# Patient Record
Sex: Male | Born: 1950 | Race: White | Hispanic: No | Marital: Married | Smoking: Former smoker
Health system: Southern US, Community
[De-identification: ages and names within clinical notes are randomized; demographics above are authoritative.]

## PROBLEM LIST (undated history)

## (undated) DIAGNOSIS — G9389 Other specified disorders of brain: Secondary | ICD-10-CM

## (undated) DIAGNOSIS — J45909 Unspecified asthma, uncomplicated: Secondary | ICD-10-CM

## (undated) DIAGNOSIS — E538 Deficiency of other specified B group vitamins: Secondary | ICD-10-CM

## (undated) DIAGNOSIS — D649 Anemia, unspecified: Secondary | ICD-10-CM

## (undated) DIAGNOSIS — I10 Essential (primary) hypertension: Secondary | ICD-10-CM

## (undated) DIAGNOSIS — T7840XA Allergy, unspecified, initial encounter: Secondary | ICD-10-CM

## (undated) DIAGNOSIS — R269 Unspecified abnormalities of gait and mobility: Secondary | ICD-10-CM

## (undated) HISTORY — DX: Unspecified asthma, uncomplicated: J45.909

## (undated) HISTORY — DX: Essential (primary) hypertension: I10

## (undated) HISTORY — DX: Unspecified abnormalities of gait and mobility: R26.9

## (undated) HISTORY — DX: Allergy, unspecified, initial encounter: T78.40XA

## (undated) HISTORY — DX: Other specified disorders of brain: G93.89

## (undated) HISTORY — DX: Deficiency of other specified B group vitamins: E53.8

## (undated) HISTORY — DX: Anemia, unspecified: D64.9

## (undated) HISTORY — PX: LUMBAR DRAIN IMPLANTATION: SHX334

---

## 2001-02-24 DIAGNOSIS — I10 Essential (primary) hypertension: Secondary | ICD-10-CM | POA: Insufficient documentation

## 2001-02-24 DIAGNOSIS — J309 Allergic rhinitis, unspecified: Secondary | ICD-10-CM | POA: Insufficient documentation

## 2006-06-24 DIAGNOSIS — I421 Obstructive hypertrophic cardiomyopathy: Secondary | ICD-10-CM | POA: Insufficient documentation

## 2010-12-17 DIAGNOSIS — J4520 Mild intermittent asthma, uncomplicated: Secondary | ICD-10-CM | POA: Insufficient documentation

## 2012-02-12 DIAGNOSIS — G932 Benign intracranial hypertension: Secondary | ICD-10-CM

## 2012-02-12 HISTORY — DX: Benign intracranial hypertension: G93.2

## 2012-03-25 DIAGNOSIS — G9389 Other specified disorders of brain: Secondary | ICD-10-CM | POA: Insufficient documentation

## 2012-03-25 DIAGNOSIS — E538 Deficiency of other specified B group vitamins: Secondary | ICD-10-CM | POA: Insufficient documentation

## 2012-03-25 DIAGNOSIS — R269 Unspecified abnormalities of gait and mobility: Secondary | ICD-10-CM | POA: Insufficient documentation

## 2013-02-27 DIAGNOSIS — M21969 Unspecified acquired deformity of unspecified lower leg: Secondary | ICD-10-CM | POA: Insufficient documentation

## 2014-07-27 ENCOUNTER — Telehealth: Payer: Self-pay | Admitting: Family Medicine

## 2014-07-27 NOTE — Telephone Encounter (Signed)
Pt given new pt appt with Dr. Livia Snellen 09/07/14 at 1:10, pt aware to arrive 15 minutes prior to appt with a copy of insurance card and a list of current medications. Pt was seeing a doctor in [place] but insurance changed so needs new PCP.

## 2014-09-07 ENCOUNTER — Encounter: Payer: Self-pay | Admitting: Family Medicine

## 2014-09-07 ENCOUNTER — Ambulatory Visit (INDEPENDENT_AMBULATORY_CARE_PROVIDER_SITE_OTHER): Payer: Medicare Other | Admitting: Family Medicine

## 2014-09-07 ENCOUNTER — Encounter (INDEPENDENT_AMBULATORY_CARE_PROVIDER_SITE_OTHER): Payer: Self-pay

## 2014-09-07 VITALS — BP 130/79 | HR 69 | Temp 97.7°F | Ht 67.0 in | Wt 166.8 lb

## 2014-09-07 DIAGNOSIS — I10 Essential (primary) hypertension: Secondary | ICD-10-CM | POA: Diagnosis not present

## 2014-09-07 DIAGNOSIS — I421 Obstructive hypertrophic cardiomyopathy: Secondary | ICD-10-CM | POA: Diagnosis not present

## 2014-09-07 DIAGNOSIS — R5383 Other fatigue: Secondary | ICD-10-CM

## 2014-09-07 DIAGNOSIS — R269 Unspecified abnormalities of gait and mobility: Secondary | ICD-10-CM

## 2014-09-07 DIAGNOSIS — E538 Deficiency of other specified B group vitamins: Secondary | ICD-10-CM

## 2014-09-07 DIAGNOSIS — G9389 Other specified disorders of brain: Secondary | ICD-10-CM

## 2014-09-07 MED ORDER — MONTELUKAST SODIUM 10 MG PO TABS: 10.0000 mg | ORAL_TABLET | Freq: Every day | ORAL | Status: DC

## 2014-09-07 MED ORDER — CYANOCOBALAMIN 1000 MCG/ML IJ SOLN: 1000.0000 ug | INTRAMUSCULAR | Status: DC

## 2014-09-07 MED ORDER — LISINOPRIL 20 MG PO TABS: 20.0000 mg | ORAL_TABLET | Freq: Every day | ORAL | Status: DC

## 2014-09-07 MED ORDER — METOPROLOL TARTRATE 25 MG PO TABS: 25.0000 mg | ORAL_TABLET | Freq: Every day | ORAL | Status: DC

## 2014-09-07 NOTE — Progress Notes (Signed)
Subjective:  Patient ID: Jimmy Park, male    DOB: 1950/06/27  Age: 64 y.o. MRN: [id number]  CC: Establish Care   HPI Jimmy Park presents as a new patient to the practice and the area. He has past history of diagnoses noted below including asthma hypertension anemia as well as hypertropic cardiomyopathy. Works with chiropractor weekly to help with gait and posture for several years. Onset several years ago, 2007,  with fall off of a ladder. Legs got worse and worse. Dragging his left leg. Scan found fluid on brain.Occurred 3 years ago. Shunt offered, but has never been placed.Memory good. Sometimes confused. Speech is slow responses measured   follow-up of hypertension. Patient has no history of headache chest pain or shortness of breath or recent cough. Patient also denies symptoms of TIA such as numbness weakness lateralizing. Patient checks  blood pressure at home and has not had any elevated readings recently. Patient denies side effects from his medication. States taking it regularly.   Vitamin G62 has been inexplicably low on multiple occasions he does get monthly injections.  Patient also has a history of hypertrophic cardiomyopathy. He does have some occasional swelling in the feet and ankles but denies shortness of breath with exertion. History Jimmy Park has a past medical history of Anemia; Asthma; Hypertension; Allergy; Cerebral ventriculomegaly; B12 deficiency; and Gait abnormality.   He has past surgical history that includes Lumbar drain implantation.   His family history includes Asthma in his brother; Diabetes in his father and mother; Hypertension in his father and mother.He reports that he quit smoking about 41 years ago. His smoking use included Cigarettes. He started smoking about 47 years ago. He smoked 0.50 packs per day. He does not have any smokeless tobacco history on file. He reports that he drinks alcohol. His drug history is not on file.  No current outpatient  prescriptions on file prior to visit.   No current facility-administered medications on file prior to visit.    ROS Review of Systems  Constitutional: Negative for fever, chills, diaphoresis, activity change, appetite change, fatigue and unexpected weight change.  HENT: Negative for congestion, ear pain, hearing loss, postnasal drip, rhinorrhea, sore throat, tinnitus and trouble swallowing.   Eyes: Negative for photophobia, pain, discharge and redness.  Respiratory: Negative for apnea, cough, choking, chest tightness, shortness of breath, wheezing and stridor.   Cardiovascular: Negative for chest pain, palpitations and leg swelling.  Gastrointestinal: Negative for nausea, vomiting, abdominal pain, diarrhea, constipation, blood in stool and abdominal distention.  Endocrine: Negative for cold intolerance, heat intolerance, polydipsia, polyphagia and polyuria.  Genitourinary: Negative for dysuria, urgency, frequency, hematuria, flank pain, enuresis, difficulty urinating and genital sores.  Musculoskeletal: Negative for joint swelling and arthralgias.  Skin: Negative for color change, rash and wound.  Allergic/Immunologic: Negative for immunocompromised state.  Neurological: Negative for dizziness, tremors, seizures, syncope, facial asymmetry, speech difficulty, weakness, light-headedness, numbness and headaches.  Hematological: Does not bruise/bleed easily.  Psychiatric/Behavioral: Negative for suicidal ideas, hallucinations, behavioral problems, confusion, sleep disturbance, dysphoric mood, decreased concentration and agitation. The patient is not nervous/anxious and is not hyperactive.     Objective:  BP 130/79 mmHg  Pulse 69  Temp(Src) 97.7 F (36.5 C) (Oral)  Ht $R'5\' 7"'pZ$  (1.702 m)  Wt 166 lb 12.8 oz (75.66 kg)  BMI 26.12 kg/m2  Physical Exam  Constitutional: He is oriented to person, place, and time. He appears well-developed and well-nourished. No distress.  HENT:  Head:  Normocephalic and atraumatic.  Right Ear:  External ear normal.  Left Ear: External ear normal.  Nose: Nose normal.  Mouth/Throat: Oropharynx is clear and moist.  Eyes: Conjunctivae and EOM are normal. Pupils are equal, round, and reactive to light.  Neck: Normal range of motion. Neck supple. No thyromegaly present.  Cardiovascular: Normal rate, regular rhythm and normal heart sounds.   No murmur heard. Pulmonary/Chest: Effort normal and breath sounds normal. No respiratory distress. He has no wheezes. He has no rales.  Abdominal: Soft. Bowel sounds are normal. He exhibits no distension. There is no tenderness.  Lymphadenopathy:    He has no cervical adenopathy.  Neurological: He is alert and oriented to person, place, and time. He has normal reflexes. Coordination abnormal.  Skin: Skin is warm and dry.  Psychiatric: He has a normal mood and affect. His behavior is normal. Judgment and thought content normal.    Assessment & Plan:   Jimmy Park was seen today for establish care.  Diagnoses and all orders for this visit:  B12 deficiency Orders: -     CBC with Differential/Platelet -     Vitamin B12  Abnormal gait Orders: -     CMP14+EGFR -     Vitamin B12  Essential hypertension Orders: -     CMP14+EGFR -     NMR, lipoprofile -     Vitamin B12  Other fatigue Orders: -     CMP14+EGFR -     PSA, total and free -     Thyroid Panel With TSH -     Vit D  25 hydroxy (rtn osteoporosis monitoring) -     Vitamin B12  Cardiomyopathy, hypertrophic obstructive  Cerebral ventriculomegaly  Other orders -     lisinopril (PRINIVIL,ZESTRIL) 20 MG tablet; Take 1 tablet (20 mg total) by mouth daily. -     metoprolol tartrate (LOPRESSOR) 25 MG tablet; Take 1 tablet (25 mg total) by mouth daily. -     montelukast (SINGULAIR) 10 MG tablet; Take 1 tablet (10 mg total) by mouth daily. -     cyanocobalamin (,VITAMIN B-12,) 1000 MCG/ML injection; Inject 1 mL (1,000 mcg total) into the muscle  every 30 (thirty) days.   I have changed Jimmy Park lisinopril, metoprolol tartrate, montelukast, and cyanocobalamin. I am also having him maintain his fexofenadine, albuterol, aspirin, Docusate Sodium, folic acid, DHA-EPA-VITAMIN E PO, and ibuprofen.  Meds ordered this encounter  Medications  . DISCONTD: montelukast (SINGULAIR) 10 MG tablet    Sig: Take 1 tablet by mouth daily.  [name] DISCONTD: lisinopril (PRINIVIL,ZESTRIL) 20 MG tablet    Sig: Take 1 tablet by mouth daily.  . fexofenadine (ALLEGRA) 180 MG tablet    Sig: Take 1 tablet by mouth daily.  [name] DISCONTD: metoprolol tartrate (LOPRESSOR) 25 MG tablet    Sig: Take 1 tablet by mouth daily.  [name] albuterol (PROVENTIL HFA;VENTOLIN HFA) 108 (90 BASE) MCG/ACT inhaler    Sig: Inhale 1 puff into the lungs every 4 (four) hours as needed.  [name] DISCONTD: cyanocobalamin (,VITAMIN B-12,) 1000 MCG/ML injection    Sig: Inject 1,000 mcg into the muscle every 30 (thirty) days.  [name] aspirin 81 MG tablet    Sig: Take 1 tablet by mouth daily.  [name] Sodium 100 MG capsule    Sig: Take 1 tablet by mouth at bedtime.  . folic acid (FOLVITE) 1 MG tablet    Sig: Take 1 tablet by mouth daily.  . DHA-EPA-VITAMIN E PO    Sig: Take 1 tablet by mouth daily.  [name]  ibuprofen (ADVIL,MOTRIN) 400 MG tablet    Sig: Take 1 tablet by mouth 2 (two) times daily as needed.  [name] lisinopril (PRINIVIL,ZESTRIL) 20 MG tablet    Sig: Take 1 tablet (20 mg total) by mouth daily.    Dispense:  90 tablet    Refill:  4  . metoprolol tartrate (LOPRESSOR) 25 MG tablet    Sig: Take 1 tablet (25 mg total) by mouth daily.    Dispense:  90 tablet    Refill:  4  . montelukast (SINGULAIR) 10 MG tablet    Sig: Take 1 tablet (10 mg total) by mouth daily.    Dispense:  90 tablet    Refill:  4  . cyanocobalamin (,VITAMIN B-12,) 1000 MCG/ML injection    Sig: Inject 1 mL (1,000 mcg total) into the muscle every 30 (thirty) days.    Dispense:  1 mL    Refill:  11     Follow-up: Return in  about 6 weeks (around 10/19/2014). patient is to follow-up with neurosurgery and consider shunt placement. Referral to do neurosurgery offered. Patient and family want to consider that. Continue chiropractic for now, however physical therapy may be a good option. Regular exercise is recommended. Dietary recommendations and safety recommendations reviewed.  Claretta Fraise, M.D.

## 2014-09-08 LAB — VITAMIN D 25 HYDROXY (VIT D DEFICIENCY, FRACTURES): Vit D, 25-Hydroxy: 50.4 ng/mL (ref 30.0–100.0)

## 2014-09-08 LAB — NMR, LIPOPROFILE
Cholesterol: 168 mg/dL (ref 100–199)
HDL Cholesterol by NMR: 39 mg/dL — ABNORMAL LOW (ref >39)
HDL Particle Number: 28.1 umol/L — ABNORMAL LOW (ref >=30.5)
LDL Particle Number: 1066 nmol/L — ABNORMAL HIGH (ref <1000)
LDL Size: 21.1 nm (ref >20.5)
LDL-C: 107 mg/dL — ABNORMAL HIGH (ref 0–99)
LP-IR Score: 60 — ABNORMAL HIGH (ref <=45)
Small LDL Particle Number: 395 nmol/L (ref <=527)
Triglycerides by NMR: 110 mg/dL (ref 0–149)

## 2014-09-08 LAB — PSA, TOTAL AND FREE
PSA, Free Pct: 26.7 %
PSA, Free: 0.08 ng/mL
PSA: 0.3 ng/mL (ref 0.0–4.0)

## 2014-09-08 LAB — CMP14+EGFR
ALT: 30 IU/L (ref 0–44)
AST: 24 IU/L (ref 0–40)
Albumin/Globulin Ratio: 1.7 (ref 1.1–2.5)
Albumin: 4.8 g/dL (ref 3.6–4.8)
Alkaline Phosphatase: 68 IU/L (ref 39–117)
BUN/Creatinine Ratio: 23 — ABNORMAL HIGH (ref 10–22)
BUN: 20 mg/dL (ref 8–27)
Bilirubin Total: 0.6 mg/dL (ref 0.0–1.2)
CO2: 23 mmol/L (ref 18–29)
Calcium: 9.8 mg/dL (ref 8.6–10.2)
Chloride: 99 mmol/L (ref 97–108)
Creatinine, Ser: 0.88 mg/dL (ref 0.76–1.27)
GFR calc Af Amer: 105 mL/min/{1.73_m2} (ref >59)
GFR calc non Af Amer: 91 mL/min/{1.73_m2} (ref >59)
Globulin, Total: 2.8 g/dL (ref 1.5–4.5)
Glucose: 89 mg/dL (ref 65–99)
Potassium: 4.3 mmol/L (ref 3.5–5.2)
Sodium: 142 mmol/L (ref 134–144)
Total Protein: 7.6 g/dL (ref 6.0–8.5)

## 2014-09-08 LAB — CBC WITH DIFFERENTIAL/PLATELET
Basophils Absolute: 0 10*3/uL (ref 0.0–0.2)
Basos: 0 %
Eos: 3 %
Eosinophils Absolute: 0.2 10*3/uL (ref 0.0–0.4)
HCT: 41.7 % (ref 37.5–51.0)
Hemoglobin: 14.4 g/dL (ref 12.6–17.7)
Immature Grans (Abs): 0 10*3/uL (ref 0.0–0.1)
Immature Granulocytes: 0 %
Lymphocytes Absolute: 2.1 10*3/uL (ref 0.7–3.1)
Lymphs: 35 %
MCH: 32.1 pg (ref 26.6–33.0)
MCHC: 34.5 g/dL (ref 31.5–35.7)
MCV: 93 fL (ref 79–97)
Monocytes Absolute: 0.4 10*3/uL (ref 0.1–0.9)
Monocytes: 6 %
Neutrophils Absolute: 3.4 10*3/uL (ref 1.4–7.0)
Neutrophils Relative %: 56 %
Platelets: 225 10*3/uL (ref 150–379)
RBC: 4.48 x10E6/uL (ref 4.14–5.80)
RDW: 13.4 % (ref 12.3–15.4)
WBC: 6.1 10*3/uL (ref 3.4–10.8)

## 2014-09-08 LAB — THYROID PANEL WITH TSH
Free Thyroxine Index: 1.4 (ref 1.2–4.9)
T3 Uptake Ratio: 20 % — ABNORMAL LOW (ref 24–39)
T4, Total: 7.2 ug/dL (ref 4.5–12.0)
TSH: 2.98 u[IU]/mL (ref 0.450–4.500)

## 2014-09-08 LAB — VITAMIN B12: Vitamin B-12: 754 pg/mL (ref 211–946)

## 2014-11-08 ENCOUNTER — Ambulatory Visit (INDEPENDENT_AMBULATORY_CARE_PROVIDER_SITE_OTHER): Payer: Medicare Other | Admitting: Family Medicine

## 2014-11-08 ENCOUNTER — Encounter: Payer: Self-pay | Admitting: Family Medicine

## 2014-11-08 VITALS — BP 131/80 | HR 58 | Temp 97.8°F | Ht 67.0 in | Wt 169.0 lb

## 2014-11-08 DIAGNOSIS — I10 Essential (primary) hypertension: Secondary | ICD-10-CM | POA: Diagnosis not present

## 2014-11-08 DIAGNOSIS — G9389 Other specified disorders of brain: Secondary | ICD-10-CM | POA: Diagnosis not present

## 2014-11-08 NOTE — Progress Notes (Signed)
Subjective:  Patient ID: Jimmy Park, male    DOB: [birth year]  Age: 64 y.o. MRN: [id number]  CC: Hypertension; cerebral ventriculopathy; and Vit B12 deficiency   HPI Trayvond Curling presents for  left forehead headache seems to be increasing recently. This is similar to when he had previous problems with increased fluid in the cerebral ventricles. Patient is also reporting some problems with the weakness in the left leg is causing him to drag it when he walks. He is also noted some eye problems apparently the optic nerve has been enlarged or swollen. This is based on testing done at [facility]. Additionally there is concern for ringing in the ears it's been there for a long time. Hearing has been diminished. He has had some problems with ear wax accumulation in the past.  History Edman has a past medical history of Anemia; Asthma; Hypertension; Allergy; Cerebral ventriculomegaly; B12 deficiency; and Gait abnormality.   He has past surgical history that includes Lumbar drain implantation.   His family history includes Asthma in his brother; Diabetes in his father and mother; Hypertension in his father and mother.He reports that he quit smoking about 41 years ago. His smoking use included Cigarettes. He started smoking about 47 years ago. He smoked 0.50 packs per day. He does not have any smokeless tobacco history on file. He reports that he drinks alcohol. His drug history is not on file.  Outpatient Prescriptions Prior to Visit  Medication Sig Dispense Refill  . albuterol (PROVENTIL HFA;VENTOLIN HFA) 108 (90 BASE) MCG/ACT inhaler Inhale 1 puff into the lungs every 4 (four) hours as needed.    [name] aspirin 81 MG tablet Take 1 tablet by mouth daily.    . cyanocobalamin (,VITAMIN B-12,) 1000 MCG/ML injection Inject 1 mL (1,000 mcg total) into the muscle every 30 (thirty) days. 1 mL 11  . DHA-EPA-VITAMIN E PO Take 1 tablet by mouth daily.    [name] Sodium 100 MG capsule Take 1 tablet by  mouth at bedtime.    . fexofenadine (ALLEGRA) 180 MG tablet Take 1 tablet by mouth daily.    . folic acid (FOLVITE) 1 MG tablet Take 1 tablet by mouth daily.    [name] ibuprofen (ADVIL,MOTRIN) 400 MG tablet Take 1 tablet by mouth 2 (two) times daily as needed.    [name] lisinopril (PRINIVIL,ZESTRIL) 20 MG tablet Take 1 tablet (20 mg total) by mouth daily. 90 tablet 4  . metoprolol tartrate (LOPRESSOR) 25 MG tablet Take 1 tablet (25 mg total) by mouth daily. 90 tablet 4  . montelukast (SINGULAIR) 10 MG tablet Take 1 tablet (10 mg total) by mouth daily. 90 tablet 4   No facility-administered medications prior to visit.    ROS Review of Systems  Constitutional: Negative for fever, chills and diaphoresis.  HENT: Negative for congestion, rhinorrhea and sore throat.   Respiratory: Negative for cough, shortness of breath and wheezing.   Cardiovascular: Negative for chest pain.  Gastrointestinal: Negative for nausea, vomiting, abdominal pain, diarrhea, constipation and abdominal distention.  Genitourinary: Negative for dysuria and frequency.  Musculoskeletal: Negative for joint swelling and arthralgias.  Skin: Negative for rash.  Neurological: Negative for headaches.    Objective:  BP 131/80 mmHg  Pulse 58  Temp(Src) 97.8 F (36.6 C) (Oral)  Ht 5\' 7"  (1.702 m)  Wt 169 lb (76.658 kg)  BMI 26.46 kg/m2  BP Readings from Last 3 Encounters:  11/08/14 131/80  09/07/14 130/79    Wt Readings from Last 3  Encounters:  11/08/14 169 lb (76.658 kg)  09/07/14 166 lb 12.8 oz (75.66 kg)     Physical Exam  Constitutional: He is oriented to person, place, and time. He appears well-developed and well-nourished. No distress.  HENT:  Head: Normocephalic and atraumatic.  Right Ear: External ear normal.  Left Ear: External ear normal.  Nose: Nose normal.  Mouth/Throat: Oropharynx is clear and moist.  Eyes: Conjunctivae and EOM are normal. Pupils are equal, round, and reactive to light.  Neck: Normal  range of motion. Neck supple. No thyromegaly present.  Cardiovascular: Normal rate, regular rhythm and normal heart sounds.   No murmur heard. Pulmonary/Chest: Effort normal and breath sounds normal. No respiratory distress. He has no wheezes. He has no rales.  Abdominal: Soft. Bowel sounds are normal. He exhibits no distension. There is no tenderness.  Lymphadenopathy:    He has no cervical adenopathy.  Neurological: He is alert and oriented to person, place, and time. He has normal reflexes.  Skin: Skin is warm and dry.  Psychiatric: He has a normal mood and affect. His behavior is normal. Judgment and thought content normal.    No results found for: HGBA1C  Lab Results  Component Value Date   WBC 6.1 09/07/2014   HGB 14.4 09/07/2014   HCT 41.7 09/07/2014   PLT 225 09/07/2014   GLUCOSE 89 09/07/2014   CHOL 168 09/07/2014   TRIG 110 09/07/2014   HDL 39* 09/07/2014   ALT 30 09/07/2014   AST 24 09/07/2014   NA 142 09/07/2014   K 4.3 09/07/2014   CL 99 09/07/2014   CREATININE 0.88 09/07/2014   BUN 20 09/07/2014   CO2 23 09/07/2014   TSH 2.980 09/07/2014   PSA 0.3 09/07/2014    Patient was never admitted.  Assessment & Plan:   Devery was seen today for hypertension, cerebral ventriculopathy and vit b12 deficiency.  Diagnoses and all orders for this visit:  Cerebral ventriculomegaly Orders: -     Ambulatory referral to Neurosurgery  Essential hypertension   I am having Mr. Nickerson maintain his fexofenadine, albuterol, aspirin, Docusate Sodium, folic acid, DHA-EPA-VITAMIN E PO, ibuprofen, lisinopril, metoprolol tartrate, montelukast, and cyanocobalamin.  No orders of the defined types were placed in this encounter.     Follow-up: Return in about 3 months (around 02/08/2015).  Claretta Fraise, M.D.

## 2014-11-15 ENCOUNTER — Telehealth: Payer: Self-pay | Admitting: Family Medicine

## 2014-11-15 NOTE — Telephone Encounter (Signed)
Told patient the referral office will call them when they get it set-up.

## 2014-11-28 ENCOUNTER — Other Ambulatory Visit: Payer: Self-pay

## 2014-11-28 MED ORDER — FEXOFENADINE HCL 180 MG PO TABS: 180.0000 mg | ORAL_TABLET | Freq: Every day | ORAL | Status: DC

## 2014-12-14 DIAGNOSIS — G912 (Idiopathic) normal pressure hydrocephalus: Secondary | ICD-10-CM | POA: Insufficient documentation

## 2014-12-18 DIAGNOSIS — Q030 Malformations of aqueduct of Sylvius: Secondary | ICD-10-CM | POA: Insufficient documentation

## 2014-12-27 ENCOUNTER — Encounter: Payer: Self-pay | Admitting: Family Medicine

## 2014-12-27 ENCOUNTER — Ambulatory Visit (INDEPENDENT_AMBULATORY_CARE_PROVIDER_SITE_OTHER): Payer: Medicare Other | Admitting: Family Medicine

## 2014-12-27 VITALS — BP 132/76 | HR 71 | Temp 97.4°F | Ht 67.0 in | Wt 166.0 lb

## 2014-12-27 DIAGNOSIS — G9389 Other specified disorders of brain: Secondary | ICD-10-CM

## 2014-12-27 DIAGNOSIS — R269 Unspecified abnormalities of gait and mobility: Secondary | ICD-10-CM | POA: Diagnosis not present

## 2014-12-27 NOTE — Progress Notes (Signed)
Subjective:  Patient ID: Jimmy Park, male    DOB: [birth year]  Age: 64 y.o. 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 This was closed after a plate was inserted using multiple staples. He is due to have these removed and would like to have that done here today. Additional he relates that he has seen some improvement in his coordination and gait but still is unsteady for balance. He has not been recommended for physical therapy/rehabilitation at this time by the surgeon.  History Jimmy Park has a past medical history of Anemia; Asthma; Hypertension; Allergy; Cerebral ventriculomegaly; B12 deficiency; and Gait abnormality.   He has past surgical history that includes Lumbar drain implantation.   His family history includes Asthma in his brother; Diabetes in his father and mother; Hypertension in his father and mother.He reports that he quit smoking about 41 years ago. His smoking use included Cigarettes. He started smoking about 47 years ago. He smoked 0.50 packs per day. He does not have any smokeless tobacco history on file. He reports that he drinks alcohol. His drug history is not on file.  Outpatient Prescriptions Prior to Visit  Medication Sig Dispense Refill  . albuterol (PROVENTIL HFA;VENTOLIN HFA) 108 (90 BASE) MCG/ACT inhaler Inhale 1 puff into the lungs every 4 (four) hours as needed.    . cyanocobalamin (,VITAMIN B-12,) 1000 MCG/ML injection Inject 1 mL (1,000 mcg total) into the muscle every 30 (thirty) days. 1 mL 11  . DHA-EPA-VITAMIN E PO Take 1 tablet by mouth daily.    Jimmy Park Sodium 100 MG capsule Take 1 tablet by mouth at bedtime.    . fexofenadine (ALLEGRA) 180 MG tablet Take 1 tablet (180 mg total) by mouth daily. 30 tablet 5  . folic acid (FOLVITE) 1 MG tablet Take 1 tablet by mouth daily.     [name] lisinopril (PRINIVIL,ZESTRIL) 20 MG tablet Take 1 tablet (20 mg total) by mouth daily. 90 tablet 4  . metoprolol tartrate (LOPRESSOR) 25 MG tablet Take 1 tablet (25 mg total) by mouth daily. 90 tablet 4  . montelukast (SINGULAIR) 10 MG tablet Take 1 tablet (10 mg total) by mouth daily. 90 tablet 4  . aspirin 81 MG tablet Take 1 tablet by mouth daily.    [name] ibuprofen (ADVIL,MOTRIN) 400 MG tablet Take 1 tablet by mouth 2 (two) times daily as needed.     No facility-administered medications prior to visit.    ROS Review of Systems  Constitutional: Negative for fever, chills and diaphoresis.  HENT: Negative for congestion, rhinorrhea and sore throat.   Respiratory: Negative for cough, shortness of breath and wheezing.   Cardiovascular: Negative for chest pain.  Gastrointestinal: Negative for nausea, vomiting, abdominal pain, diarrhea, constipation and abdominal distention.  Genitourinary: Negative for dysuria and frequency.  Musculoskeletal: Negative for joint swelling and arthralgias.  Skin: Negative for rash.  Neurological: Positive for speech difficulty, weakness (nonfocal) and headaches. Negative for syncope.  Psychiatric/Behavioral: Positive for decreased concentration. Negative for hallucinations, confusion, self-injury and dysphoric mood. The patient is not nervous/anxious.     Objective:  BP 132/76 mmHg  Pulse 71  Temp(Src) 97.4 F (36.3 C) (Oral)  Ht 5\' 7"  (1.702 m)  Wt 166 lb (75.297 kg)  BMI 25.99 kg/m2  BP Readings from Last 3 Encounters:  12/27/14 132/76  11/08/14 131/80  09/07/14 130/79    Wt Readings from Last 3 Encounters:  12/27/14 166 lb (75.297 kg)  11/08/14 169 lb (76.658 kg)  09/07/14 166 lb 12.8 oz (75.66 kg)     Physical Exam  Constitutional: He is oriented to person, place, and time. He appears well-developed and well-nourished. No distress.  HENT:  Head: Normocephalic and atraumatic.  Right Ear: External ear normal.  Left Ear: External ear  normal.  Nose: Nose normal.  Mouth/Throat: Oropharynx is clear and moist.  Surgical incision noted is an a 75 arc at the right frontal parietal region approximately 3 inches with 10 staples in place. No sign of infection noted  Eyes: Conjunctivae and EOM are normal. Pupils are equal, round, and reactive to light.  Neck: Normal range of motion. Neck supple. No thyromegaly present.  Cardiovascular: Normal rate, regular rhythm and normal heart sounds.   No murmur heard. Pulmonary/Chest: Effort normal and breath sounds normal. No respiratory distress. He has no wheezes. He has no rales.  Abdominal: Soft. Bowel sounds are normal. He exhibits no distension. There is no tenderness.  Lymphadenopathy:    He has no cervical adenopathy.  Neurological: He is alert and oriented to person, place, and time. He displays abnormal reflex. No cranial nerve deficit. He exhibits abnormal muscle tone. Coordination abnormal.  Skin: Skin is warm and dry.  Psychiatric: He has a normal mood and affect. His behavior is normal. Thought content normal.    No results found for: HGBA1C  Lab Results  Component Value Date   WBC 6.1 09/07/2014   HGB 14.4 09/07/2014   HCT 41.7 09/07/2014   PLT 225 09/07/2014   GLUCOSE 89 09/07/2014   CHOL 168 09/07/2014   TRIG 110 09/07/2014   HDL 39* 09/07/2014   ALT 30 09/07/2014   AST 24 09/07/2014   NA 142 09/07/2014   K 4.3 09/07/2014   CL 99 09/07/2014   CREATININE 0.88 09/07/2014   BUN 20 09/07/2014   CO2 23 09/07/2014   TSH 2.980 09/07/2014   PSA 0.3 09/07/2014    Patient was never admitted.  Assessment & Plan:   Jimmy Park was seen today for hospitalization follow-up.  Diagnoses and all orders for this visit:  Cerebral ventriculomegaly Orders: -     Ambulatory referral to Physical Therapy  Abnormal gait Orders: -     Ambulatory referral to Physical Therapy   I am having Jimmy Park maintain his albuterol, aspirin, Docusate Sodium, folic acid,  DHA-EPA-VITAMIN E PO, ibuprofen, lisinopril, metoprolol tartrate, montelukast, cyanocobalamin, fexofenadine, oxyCODONE, omeprazole, and acetaminophen.  Meds ordered this encounter  Medications  . oxyCODONE (OXY IR/ROXICODONE) 5 MG immediate release tablet    Sig: Take 5 mg by mouth every 6 (six) hours as needed.   [name] omeprazole (PRILOSEC) 40 MG capsule    Sig: Take 40 mg by mouth daily.   [name] acetaminophen (TYLENOL) 325 MG tablet    Sig: Take 325 mg by mouth every 6 (six) hours as needed.      Follow-up: Return in about 3 months (around 03/29/2015), or if symptoms worsen or fail to improve.  Claretta Fraise, M.D.

## 2015-01-09 ENCOUNTER — Ambulatory Visit: Payer: Medicare Other | Attending: Family Medicine | Admitting: Physical Therapy

## 2015-01-09 DIAGNOSIS — R2681 Unsteadiness on feet: Secondary | ICD-10-CM | POA: Diagnosis present

## 2015-01-09 NOTE — Therapy (Signed)
[facility] [place], [place], [address] Phone: [phone]   Fax:  [phone]  Physical Therapy Evaluation  Patient Details  Name: Jimmy Park MRN: [id number] Date of Birth: 12/26/1950 Referring Provider:  Claretta Fraise, MD  Encounter Date: 01/09/2015      PT End of Session - 01/09/15 1056    Visit Number 1   Number of Visits 12   Date for PT Re-Evaluation 02/20/15   PT Start Time 0950   PT Stop Time 1022   PT Time Calculation (min) 32 min   Equipment Utilized During Treatment --  Straight cane.   Activity Tolerance Patient tolerated treatment well   Behavior During Therapy [facility] for tasks assessed/performed      Past Medical History  Diagnosis Date  . Anemia   . Asthma   . Hypertension   . Allergy   . Cerebral ventriculomegaly   . B12 deficiency   . Gait abnormality     Past Surgical History  Procedure Laterality Date  . Lumbar drain implantation      There were no vitals filed for this visit.  Visit Diagnosis:  Unsteadiness - Plan: PT plan of care cert/re-cert      Subjective Assessment - 01/09/15 1047    Subjective Having been doing better since surgery.   Limitations Walking   Patient Stated Goals Walk better and more stable.   Multiple Pain Sites No            [facility] PT Assessment - 01/09/15 0001    Balance Screen   Has the patient fallen in the past 6 months No   Has the patient had a decrease in activity level because of a fear of falling?  No   Is the patient reluctant to leave their home because of a fear of falling?  No   Home Ec[employer] residence   Prior Function   Level of Independence Independent   Cognition   Overall Cognitive Status Within Functional Limits for tasks assessed   ROM / Strength   AROM / PROM / Strength AROM;Strength   AROM   Overall AROM Comments Bilateral LE range of motion is WNL though movements are a bit ataxic in nature  (note:  previous left ankle injury).   Strength   Overall Strength Comments Normal bilateral LE strength.   Ambulation/Gait   Gait Comments Slow and purposeful gait pattern with a straight cane.  The patient exhibits a shortened step and stride length and a decrease in toe clearance (though no toe drg occurred).   Standardized Balance Assessment   Standardized Balance Assessment Berg Balance Test   Berg Balance Test   Sit to Stand Able to stand without using hands and stabilize independently   Standing Unsupported Able to stand safely 2 minutes   Sitting with Back Unsupported but Feet Supported on Floor or Stool Able to sit safely and securely 2 minutes   Stand to Sit Sits safely with minimal use of hands   Transfers Able to transfer safely, minor use of hands   Standing Unsupported with Eyes Closed Able to stand 10 seconds safely   Standing Ubsupported with Feet Together Able to place feet together independently and stand for 1 minute with supervision   From Standing, Reach Forward with Outstretched Arm Can reach confidently >25 cm (10")   From Standing Position, Pick up Object from Floor Able to pick up shoe safely and easily   From Standing Position,  Turn to Look Behind Over each Shoulder Looks behind from both sides and weight shifts well   Turn 360 Degrees Needs close supervision or verbal cueing   Standing Unsupported, Alternately Place Feet on Step/Stool Able to complete >2 steps/needs minimal assist   Standing Unsupported, One Foot in [place] help to step but can hold 15 seconds   Standing on One Leg Able to lift leg independently and hold equal to or more than 3 seconds   Total Score 44                                PT Long Term Goals - 01/09/15 1139    PT LONG TERM GOAL #1   Title Ind with HEP.   Time 6   Period Weeks   Status New   PT LONG TERM GOAL #2   Title Improve Berg score to 50/56.   Time 6   Period Weeks   Status New   PT LONG TERM  GOAL #3   Title Normal step and stride length with gait pattern and adequate toe clearance.   Time 6   Period Weeks   Status New               Plan - 01/09/15 1057    Clinical Impression Statement The patient has had ongoing difficulties with walking due to cerebral ventriculomegaly.  He had a recent surgery has has shown improvement with regards to walking.  His wife stated he dragged his left leg previously.   Pt will benefit from skilled therapeutic intervention in order to improve on the following deficits Decreased balance;Difficulty walking   PT Frequency 3x / week   PT Duration 4 weeks   PT Treatment/Interventions Gait training;Neuromuscular re-education;Balance training;Therapeutic exercise   PT Next Visit Plan Gait training and balance program.         Problem List Patient Active Problem List   Diagnosis Date Noted  . B12 deficiency 03/25/2012  . Cerebral ventriculomegaly 03/25/2012  . Abnormal gait 03/25/2012  . Asthma, intermittent 12/17/2010  . Cardiomyopathy, hypertrophic obstructive 06/24/2006  . Allergic rhinitis 02/24/2001  . Benign essential HTN 02/24/2001    Tahliyah Anagnos, [place] MPT 01/09/2015, 11:43 AM  [facility] [address] [place], [place], [address] Phone: [phone]   Fax:  [phone]

## 2015-01-15 ENCOUNTER — Ambulatory Visit: Payer: Medicare Other | Admitting: *Deleted

## 2015-01-15 ENCOUNTER — Encounter: Payer: Self-pay | Admitting: *Deleted

## 2015-01-15 DIAGNOSIS — R2681 Unsteadiness on feet: Secondary | ICD-10-CM | POA: Diagnosis not present

## 2015-01-15 NOTE — Therapy (Signed)
[facility] [place], [place], [address] Phone: [phone]   Fax:  [phone]  Physical Therapy Treatment  Patient Details  Name: Jimmy Park MRN: [id number] Date of Birth: March 30, 1951 Referring Provider:  Claretta Fraise, MD  Encounter Date: 01/15/2015      PT End of Session - 01/15/15 1246    Visit Number 3   Number of Visits 13   Date for PT Re-Evaluation 02/20/15      Past Medical History  Diagnosis Date  . Anemia   . Asthma   . Hypertension   . Allergy   . Cerebral ventriculomegaly   . B12 deficiency   . Gait abnormality     Past Surgical History  Procedure Laterality Date  . Lumbar drain implantation      There were no vitals filed for this visit.  Visit Diagnosis:  Unsteadiness      Subjective Assessment - 01/15/15 0950    Subjective Pt. states no significant change since last visit; no falls since last visit.   Patient is accompained by: Family member   Limitations Walking   Patient Stated Goals Walk better and more stable.   Currently in Pain? No/denies   Multiple Pain Sites No                         [facility] Adult PT Treatment/Exercise - 01/15/15 0001    Transfers   Transfers Sit to Stand   Sit to Stand 5: Supervision   Five time sit to stand comments  5   Ambulation/Gait   Ambulation Distance (Feet) 100 Feet   Gait Pattern Step-through pattern  with fatigue, Pt. regressed to step to pattern   Ambulation Surface Level   High Level Balance   High Level Balance Activities Side stepping;Tandem walking;Turns;Marching forwards  side stepping 4 x 25 feet; tandem 2 x 25 ft; marching 2 x 25   Therapeutic Activites    Therapeutic Activities Other Therapeutic Activities  Parallel bars with balance board 3 min   Neuro Re-ed    Neuro Re-ed Details  --  parallel bars-marching 3 x 12   Exercises   Exercises Knee/Hip   Knee/Hip Exercises: Aerobic   Nustep level 5 times 8 min    Knee/Hip Exercises: Standing   Heel Raises Both;3 sets;20 reps   Forward Step Up Both;1 set;15 reps  some difficulty with left foot clearance                PT Education - 01/15/15 1039    Education provided Yes   Education Details Pt. encouraged to lift left foot for more clearance and chances to trip   Person(s) Educated Patient   Methods Explanation   Comprehension Returned demonstration;Verbalized understanding             PT Long Term Goals - 01/09/15 1139    PT LONG TERM GOAL #1   Title Ind with HEP.   Time 6   Period Weeks   Status New   PT LONG TERM GOAL #2   Title Improve Berg score to 50/56.   Time 6   Period Weeks   Status New   PT LONG TERM GOAL #3   Title Normal step and stride length with gait pattern and adequate toe clearance.   Time 6   Period Weeks   Status New               Plan - 01/15/15 1054  Clinical Impression Statement Pt. participated well with therapy today; he needed verbal cues to avoid catching left foot.  Needed min assist with some ambulation exercises.   Pt will benefit from skilled therapeutic intervention in order to improve on the following deficits Decreased balance;Difficulty walking   Rehab Potential Good   PT Frequency 3x / week   PT Duration 4 weeks   PT Treatment/Interventions Gait training;Neuromuscular re-education;Balance training;Therapeutic exercise   PT Next Visit Plan Continue balance exercises   PT Home Exercise Plan Prescribe some gait exercises next visit.   Consulted and Agree with Plan of Care Patient        Problem List Patient Active Problem List   Diagnosis Date Noted  . B12 deficiency 03/25/2012  . Cerebral ventriculomegaly 03/25/2012  . Abnormal gait 03/25/2012  . Asthma, intermittent 12/17/2010  . Cardiomyopathy, hypertrophic obstructive 06/24/2006  . Allergic rhinitis 02/24/2001  . Benign essential HTN 02/24/2001    Cherlyn Cushing 01/15/2015, 12:53 PM  [facility] [address] [place], [place], [address] Phone: [phone]   Fax:  [phone]

## 2015-01-16 ENCOUNTER — Encounter: Payer: Self-pay | Admitting: Physical Therapy

## 2015-01-16 ENCOUNTER — Ambulatory Visit: Payer: Medicare Other | Admitting: Physical Therapy

## 2015-01-16 DIAGNOSIS — R2681 Unsteadiness on feet: Secondary | ICD-10-CM | POA: Diagnosis not present

## 2015-01-16 NOTE — Therapy (Signed)
[facility] [place], [place], [address] Phone: [phone]   Fax:  [phone]  Physical Therapy Treatment  Patient Details  Name: Jimmy Park MRN: [id number] Date of Birth: 1951-05-09 Referring Provider:  Claretta Fraise, MD  Encounter Date: 01/16/2015      PT End of Session - 01/16/15 1024    Visit Number 4   Number of Visits 13   Date for PT Re-Evaluation 02/20/15   PT Start Time 0944   PT Stop Time 1025   PT Time Calculation (min) 41 min   Activity Tolerance Patient tolerated treatment well   Behavior During Therapy [facility] for tasks assessed/performed      Past Medical History  Diagnosis Date  . Anemia   . Asthma   . Hypertension   . Allergy   . Cerebral ventriculomegaly   . B12 deficiency   . Gait abnormality     Past Surgical History  Procedure Laterality Date  . Lumbar drain implantation      There were no vitals filed for this visit.  Visit Diagnosis:  Unsteadiness      Subjective Assessment - 01/16/15 0956    Subjective Pateint had no complaints after last treatment   Patient is accompained by: Family member   Limitations Walking   Patient Stated Goals Walk better and more stable.   Currently in Pain? No/denies                         [facility] Adult PT Treatment/Exercise - 01/16/15 0001    Transfers   Sit to Stand 5: Supervision;Without upper extremity assist  x10   Knee/Hip Exercises: Aerobic   Nustep x1min L4             Balance Exercises - 01/16/15 1001    Balance Exercises: Standing   Standing Eyes Opened Narrow base of support (BOS);Foam/compliant surface;Time  59min, then with 2# reachouts 2x10, D1/D2 2x10   Tandem Stance Eyes open;4 reps   Standing, One Foot on a Step Eyes open;8 inch;30 secs   Rockerboard Anterior/posterior  2min   Step Ups Forward;6 inch;UE support 1  x10 each LE using opposite UE   Tandem Gait 4 reps;Forward   Retro Gait 5 reps  no  UE support foward/ back x5each   Sidestepping 5 reps  each way with no UE support           PT Education - 01/16/15 1022    Education Details HEP   Person(s) Educated Patient   Methods Explanation;Demonstration;Handout   Comprehension Verbalized understanding;Returned demonstration             PT Long Term Goals - 01/09/15 1139    PT LONG TERM GOAL #1   Title Ind with HEP.   Time 6   Period Weeks   Status New   PT LONG TERM GOAL #2   Title Improve Berg score to 50/56.   Time 6   Period Weeks   Status New   PT LONG TERM GOAL #3   Title Normal step and stride length with gait pattern and adequate toe clearance.   Time 6   Period Weeks   Status New               Plan - 01/16/15 1025    Clinical Impression Statement Patient progressing with balance activities today. Had episodes of slight LOB with some activities yet able to recover independently. Patient was given HEP for  beginning balance activiies. Patient able to understand and will perform with safety first. Goals ongoig due to balance limitations.   Pt will benefit from skilled therapeutic intervention in order to improve on the following deficits Decreased balance;Difficulty walking   Rehab Potential Good   PT Frequency 3x / week   PT Duration 4 weeks   PT Treatment/Interventions Gait training;Neuromuscular re-education;Balance training;Therapeutic exercise   PT Next Visit Plan Continue balance exercises, may try hip bridges for core strength   Consulted and Agree with Plan of Care Patient        Problem List Patient Active Problem List   Diagnosis Date Noted  . B12 deficiency 03/25/2012  . Cerebral ventriculomegaly 03/25/2012  . Abnormal gait 03/25/2012  . Asthma, intermittent 12/17/2010  . Cardiomyopathy, hypertrophic obstructive 06/24/2006  . Allergic rhinitis 02/24/2001  . Benign essential HTN 02/24/2001    Caden Fatica P, PTA 01/16/2015, 10:34 AM  [facility] [place], [place], [address] Phone: [phone]   Fax:  [phone]

## 2015-01-16 NOTE — Patient Instructions (Signed)
  Hip Flexion   Hold on to counter or chair then march legs left then right. Hold __2__ seconds. Repeat on other leg. Do __10__ repetitions, _2___ sets.  http://bt.exer.us/40   Copyright  VHI. All rights reserved.  Half Squat to Chair   Stand with feet shoulder width apart. Push buttocks backward and lower slowly, sitting in chair lightly and returning to standing position. Complete _2_ sets of 10_ repetitions. Perform __2-3_ sessions per day.  http://gtsc.exer.us/436   Copyright  VHI. All rights reserved.  Supine Pelvic Tilt (Active)   While lying on back with knees bent, pull abdomen in and up and flatten back. Hold _10__ seconds. Relax. Complete _2__ sets of _10__ repetitions. Perform _2-4__ sessions per day.  Copyright  VHI. All rights reserved.

## 2015-01-22 ENCOUNTER — Ambulatory Visit: Payer: Medicare Other | Admitting: Physical Therapy

## 2015-01-22 ENCOUNTER — Encounter: Payer: Self-pay | Admitting: Physical Therapy

## 2015-01-22 DIAGNOSIS — R2681 Unsteadiness on feet: Secondary | ICD-10-CM

## 2015-01-22 NOTE — Therapy (Signed)
[facility] [place], [place], [address] Phone: [phone]   Fax:  [phone]  Physical Therapy Treatment  Patient Details  Name: Jimmy Park MRN: [id number] Date of Birth: 1950-11-08 Referring Provider:  Claretta Fraise, MD  Encounter Date: 01/22/2015      PT End of Session - 01/22/15 1024    Visit Number 5   Number of Visits 13   Date for PT Re-Evaluation 02/20/15   PT Start Time 0949   PT Stop Time 1029   PT Time Calculation (min) 40 min   Activity Tolerance Patient tolerated treatment well   Behavior During Therapy [facility] for tasks assessed/performed      Past Medical History  Diagnosis Date  . Anemia   . Asthma   . Hypertension   . Allergy   . Cerebral ventriculomegaly   . B12 deficiency   . Gait abnormality     Past Surgical History  Procedure Laterality Date  . Lumbar drain implantation      There were no vitals filed for this visit.  Visit Diagnosis:  Unsteadiness      Subjective Assessment - 01/22/15 0958    Subjective Pateint had no complaints after last treatment   Patient is accompained by: Family member   Limitations Walking   Patient Stated Goals Walk better and more stable.   Currently in Pain? No/denies                         [facility] Adult PT Treatment/Exercise - 01/22/15 0001    Transfers   Transfers Sit to Stand   Sit to Stand 6: Modified independent (Device/Increase time)   Five time sit to stand comments  15   Knee/Hip Exercises: Aerobic   Nustep 15 min L5             Balance Exercises - 01/22/15 1005    Balance Exercises: Standing   Standing Eyes Opened Narrow base of support (BOS);Foam/compliant surface;Time  47min, then 2# reachouts and D1/D2 2x10 each   Tandem Stance Eyes open;5 reps;Limitations  tandem step   Rockerboard Anterior/posterior  2min   Step Ups Forward;6 inch;UE support 1  2x10 each LE using opposite UE for support   Tandem Gait  Forward;Intermittent upper extremity support;Foam/compliant surface;5 reps                PT Long Term Goals - 01/09/15 1139    PT LONG TERM GOAL #1   Title Ind with HEP.   Time 6   Period Weeks   Status New   PT LONG TERM GOAL #2   Title Improve Berg score to 50/56.   Time 6   Period Weeks   Status New   PT LONG TERM GOAL #3   Title Normal step and stride length with gait pattern and adequate toe clearance.   Time 6   Period Weeks   Status New               Plan - 01/22/15 1028    Clinical Impression Statement Patient progressing with all balace and endurance activities today. Patient had less LOB episodes with tandem stance/step and tandem gait. Progressing toward goals yet ongoing due to balance deficits. Patient had decreased step and stride length.   Pt will benefit from skilled therapeutic intervention in order to improve on the following deficits Decreased balance;Difficulty walking   Rehab Potential Good   PT Frequency 3x / week  PT Duration 4 weeks   PT Treatment/Interventions Gait training;Neuromuscular re-education;Balance training;Therapeutic exercise   PT Next Visit Plan Continue balance exercises, may try hip bridges for core strength or reststed walking with cane        Problem List Patient Active Problem List   Diagnosis Date Noted  . B12 deficiency 03/25/2012  . Cerebral ventriculomegaly 03/25/2012  . Abnormal gait 03/25/2012  . Asthma, intermittent 12/17/2010  . Cardiomyopathy, hypertrophic obstructive 06/24/2006  . Allergic rhinitis 02/24/2001  . Benign essential HTN 02/24/2001    Gillis Boardley P, PTA 01/22/2015, 10:40 AM  [facility] [place], [place], [address] Phone: [phone]   Fax:  [phone]

## 2015-01-24 ENCOUNTER — Encounter: Payer: Self-pay | Admitting: Physical Therapy

## 2015-01-24 ENCOUNTER — Ambulatory Visit: Payer: Medicare Other | Admitting: Physical Therapy

## 2015-01-24 DIAGNOSIS — R2681 Unsteadiness on feet: Secondary | ICD-10-CM

## 2015-01-24 NOTE — Therapy (Signed)
[facility] [place], [place], [address] Phone: [phone]   Fax:  [phone]  Physical Therapy Treatment  Patient Details  Name: Jimmy Park MRN: [id number] Date of Birth: 05-Jun-1951 Referring Provider:  Claretta Fraise, MD  Encounter Date: 01/24/2015      PT End of Session - 01/24/15 1009    Visit Number 6   Number of Visits 13   Date for PT Re-Evaluation 02/20/15   PT Start Time 0944   PT Stop Time 1027   PT Time Calculation (min) 43 min   Activity Tolerance Patient tolerated treatment well   Behavior During Therapy [facility] for tasks assessed/performed      Past Medical History  Diagnosis Date  . Anemia   . Asthma   . Hypertension   . Allergy   . Cerebral ventriculomegaly   . B12 deficiency   . Gait abnormality     Past Surgical History  Procedure Laterality Date  . Lumbar drain implantation      There were no vitals filed for this visit.  Visit Diagnosis:  Unsteadiness      Subjective Assessment - 01/24/15 0945    Subjective Pateint had no complaints after last treatment   Patient is accompained by: Family member   Limitations Walking   Patient Stated Goals Walk better and more stable.   Currently in Pain? No/denies            [facility] PT Assessment - 01/24/15 0001    Berg Balance Test   Sit to Stand Able to stand without using hands and stabilize independently   Standing Unsupported Able to stand safely 2 minutes   Sitting with Back Unsupported but Feet Supported on Floor or Stool Able to sit safely and securely 2 minutes   Stand to Sit Sits safely with minimal use of hands   Transfers Able to transfer safely, minor use of hands   Standing Unsupported with Eyes Closed Able to stand 10 seconds safely   Standing Ubsupported with Feet Together Able to place feet together independently and stand 1 minute safely   From Standing, Reach Forward with Outstretched Arm Can reach confidently >25 cm  (10")   From Standing Position, Pick up Object from Floor Able to pick up shoe safely and easily   From Standing Position, Turn to Look Behind Over each Shoulder Looks behind from both sides and weight shifts well   Turn 360 Degrees Able to turn 360 degrees safely but slowly   Standing Unsupported, Alternately Place Feet on Step/Stool Able to complete >2 steps/needs minimal assist   Standing Unsupported, One Foot in Front Able to take small step independently and hold 30 seconds   Standing on One Leg Able to lift leg independently and hold equal to or more than 3 seconds   Total Score 47                     [facility] Adult PT Treatment/Exercise - 01/24/15 0001    Knee/Hip Exercises: Aerobic   Nustep 15 min L5             Balance Exercises - 01/24/15 1030    Balance Exercises: Standing   Standing Eyes Opened Narrow base of support (BOS);2 reps;30 secs;Time   Tandem Stance Eyes open;4 reps;30 secs   Rockerboard Anterior/posterior  20min   Step Ups Forward;6 inch;Intermittent UE support   Other Standing Exercises resisted walking with Pink XTS 4 ways using cane and SBA  Balance Exercises: Supine   Other Supine Exercises hip bridges 2x10   Other Supine Exercises core activation 2x10                PT Long Term Goals - 01/24/15 1014    PT LONG TERM GOAL #1   Title Ind with HEP.   Time 6   Period Weeks   Status On-going   PT LONG TERM GOAL #2   Title Improve Berg score to 50/56.   Time 6   Period Weeks   Status On-going  47/56   PT LONG TERM GOAL #3   Title Normal step and stride length with gait pattern and adequate toe clearance.   Time 6   Period Weeks   Status On-going               Plan - 01/24/15 1016    Clinical Impression Statement Patient progressing with activities today. Patient increased BERG score to 47/56 and was able to perform resisted walking with SPC/ SBA with min to mod LOB yet able to recover. Reviewed core activation in  supine for strengthening. Goals ongoing due to balance deficits.   Pt will benefit from skilled therapeutic intervention in order to improve on the following deficits Decreased balance;Difficulty walking   Rehab Potential Good   PT Frequency 3x / week   PT Duration 4 weeks   PT Treatment/Interventions Gait training;Neuromuscular re-education;Balance training;Therapeutic exercise   PT Next Visit Plan cont with POC   Consulted and Agree with Plan of Care Patient        Problem List Patient Active Problem List   Diagnosis Date Noted  . B12 deficiency 03/25/2012  . Cerebral ventriculomegaly 03/25/2012  . Abnormal gait 03/25/2012  . Asthma, intermittent 12/17/2010  . Cardiomyopathy, hypertrophic obstructive 06/24/2006  . Allergic rhinitis 02/24/2001  . Benign essential HTN 02/24/2001    Dina Warbington P, PTA 01/24/2015, 10:31 AM  [facility] [place], [place], [address] Phone: [phone]   Fax:  [phone]

## 2015-01-29 ENCOUNTER — Ambulatory Visit: Payer: Medicare Other | Admitting: *Deleted

## 2015-01-29 ENCOUNTER — Encounter: Payer: Self-pay | Admitting: *Deleted

## 2015-01-29 DIAGNOSIS — R2681 Unsteadiness on feet: Secondary | ICD-10-CM

## 2015-01-29 NOTE — Therapy (Signed)
[facility] [place], [place], [address] Phone: [phone]   Fax:  [phone]  Physical Therapy Treatment  Patient Details  Name: Jimmy Park MRN: [id number] Date of Birth: 05/20/1951 Referring Provider:  Claretta Fraise, MD  Encounter Date: 01/29/2015      PT End of Session - 01/29/15 0949    Visit Number 7   Number of Visits 13   Date for PT Re-Evaluation 02/20/15   PT Start Time 0240   PT Stop Time 9735   PT Time Calculation (min) 48 min      Past Medical History  Diagnosis Date  . Anemia   . Asthma   . Hypertension   . Allergy   . Cerebral ventriculomegaly   . B12 deficiency   . Gait abnormality     Past Surgical History  Procedure Laterality Date  . Lumbar drain implantation      There were no vitals filed for this visit.  Visit Diagnosis:  Unsteadiness      Subjective Assessment - 01/29/15 0950    Subjective Pateint had no complaints after last treatment, doing ok   Limitations Walking   Patient Stated Goals Walk better and more stable.   Currently in Pain? No/denies                         [facility] Adult PT Treatment/Exercise - 01/29/15 0001    Balance   Balance Assessed --   High Level Balance   High Level Balance Activities Side stepping;Tandem walking;Turns;Marching forwards   High Level Balance Comments SBA/CGA as needed   Exercises   Exercises Knee/Hip   Knee/Hip Exercises: Aerobic   Nustep 15 min L5 LE/UEs   Knee/Hip Exercises: Standing   Heel Raises Both;3 sets;20 reps   Hip Flexion AROM;3 sets;10 reps  TOE touches SBA/CGA balance   Rocker Board 3 minutes  balance and calf stretching   SLS on each LEs with toe touch on other   Other Standing Knee Exercises Stepovers in hallway HHA 2laps and standing Narrow BOS eyes open/closed                     PT Long Term Goals - 01/24/15 1014    PT LONG TERM GOAL #1   Title Ind with HEP.   Time 6   Period Weeks   Status On-going   PT LONG TERM GOAL #2   Title Improve Berg score to 50/56.   Time 6   Period Weeks   Status On-going  47/56   PT LONG TERM GOAL #3   Title Normal step and stride length with gait pattern and adequate toe clearance.   Time 6   Period Weeks   Status On-going               Plan - 01/29/15 1045    Clinical Impression Statement Pt did fairly well with exs and Acts today with eyes open/closed on stable surface. He was challenged most by side stepping in hall and SLS exs. Goals are ongoing   Pt will benefit from skilled therapeutic intervention in order to improve on the following deficits Decreased balance;Difficulty walking   Rehab Potential Good   PT Frequency 3x / week   PT Duration 4 weeks   PT Treatment/Interventions Gait training;Neuromuscular re-education;Balance training;Therapeutic exercise   PT Next Visit Plan cont with POC with balance, gait, endurance   Consulted and Agree with Plan of Care  Patient        Problem List Patient Active Problem List   Diagnosis Date Noted  . B12 deficiency 03/25/2012  . Cerebral ventriculomegaly 03/25/2012  . Abnormal gait 03/25/2012  . Asthma, intermittent 12/17/2010  . Cardiomyopathy, hypertrophic obstructive 06/24/2006  . Allergic rhinitis 02/24/2001  . Benign essential HTN 02/24/2001    Chilton Sallade,CHRIS, PTA 01/29/2015, 10:53 AM  [facility] [address] [place], [place], [address] Phone: [phone]   Fax:  [phone]

## 2015-01-31 ENCOUNTER — Ambulatory Visit: Payer: Medicare Other | Admitting: *Deleted

## 2015-01-31 ENCOUNTER — Encounter: Payer: Self-pay | Admitting: *Deleted

## 2015-01-31 DIAGNOSIS — R2681 Unsteadiness on feet: Secondary | ICD-10-CM | POA: Diagnosis not present

## 2015-01-31 NOTE — Therapy (Signed)
[facility] [place], [place], [address] Phone: [phone]   Fax:  [phone]  Physical Therapy Treatment  Patient Details  Name: Jimmy Park MRN: [id number] Date of Birth: 1950-06-12 Referring Provider:  Claretta Fraise, MD  Encounter Date: 01/31/2015      PT End of Session - 01/31/15 1006    Visit Number 8   Number of Visits 13   Date for PT Re-Evaluation 02/20/15   PT Start Time 0945   PT Stop Time 4496   PT Time Calculation (min) 47 min      Past Medical History  Diagnosis Date  . Anemia   . Asthma   . Hypertension   . Allergy   . Cerebral ventriculomegaly   . B12 deficiency   . Gait abnormality     Past Surgical History  Procedure Laterality Date  . Lumbar drain implantation      There were no vitals filed for this visit.  Visit Diagnosis:  Unsteadiness      Subjective Assessment - 01/31/15 1006    Subjective Pateint had no complaints after last treatment, doing ok   Patient is accompained by: Family member   Limitations Walking   Patient Stated Goals Walk better and more stable.   Currently in Pain? No/denies                         [facility] Adult PT Treatment/Exercise - 01/31/15 0001    Ambulation/Gait   Ambulation/Gait Yes   Ambulation/Gait Assistance --   Ambulation Distance (Feet) 200 Feet   Assistive device 1 person hand held assist   Gait Pattern Step-to pattern;Decreased step length - left;Decreased dorsiflexion - left   Ambulation Surface Level  carpet   Gait Comments Slow and purposeful gait pattern with a straight cane.  The patient exhibits a shortened step and stride length and a decrease in toe clearance (though no toe drg occurred).   Exercises   Exercises Knee/Hip   Knee/Hip Exercises: Aerobic   Nustep 15 min L5 LE/UEs   Knee/Hip Exercises: Standing   Heel Raises Both;3 sets;20 reps   Hip Flexion AROM;3 sets;10 reps  TOE touches SBA/CGA balance   Rocker  Board 3 minutes  balance and calf stretching   SLS on each LEs with toe touch on other   Other Standing Knee Exercises Stepovers in hallway HHA 2laps and standing Narrow BOS eyes open/closed   Other Standing Knee Exercises Balance manual rhythmic stab                     PT Long Term Goals - 01/24/15 1014    PT LONG TERM GOAL #1   Title Ind with HEP.   Time 6   Period Weeks   Status On-going   PT LONG TERM GOAL #2   Title Improve Berg score to 50/56.   Time 6   Period Weeks   Status On-going  47/56   PT LONG TERM GOAL #3   Title Normal step and stride length with gait pattern and adequate toe clearance.   Time 6   Period Weeks   Status On-going               Plan - 01/31/15 1044    Clinical Impression Statement Pt did fairly well today and did better with balance exs. He was able to recover well during rhythmic stab. act.'s without losing balance. His gait pattern is also progressing  some when pt is thinking about the heel-toe gait pattern. no goals met today and are ongoing   Pt will benefit from skilled therapeutic intervention in order to improve on the following deficits Decreased balance;Difficulty walking   Rehab Potential Good   PT Frequency 3x / week   PT Duration 4 weeks   PT Treatment/Interventions Gait training;Neuromuscular re-education;Balance training;Therapeutic exercise   PT Next Visit Plan cont with POC with balance, gait, endurance,  Berg test   PT Home Exercise Plan F/U  some gait exercises next visit.   Consulted and Agree with Plan of Care Patient        Problem List Patient Active Problem List   Diagnosis Date Noted  . B12 deficiency 03/25/2012  . Cerebral ventriculomegaly 03/25/2012  . Abnormal gait 03/25/2012  . Asthma, intermittent 12/17/2010  . Cardiomyopathy, hypertrophic obstructive 06/24/2006  . Allergic rhinitis 02/24/2001  . Benign essential HTN 02/24/2001    Nalia Honeycutt,CHRIS, PTA 01/31/2015, 10:52 AM  [facility] [place], [place], [address] Phone: [phone]   Fax:  [phone]

## 2015-02-05 ENCOUNTER — Encounter: Payer: Self-pay | Admitting: *Deleted

## 2015-02-05 ENCOUNTER — Ambulatory Visit: Payer: Medicare Other | Admitting: *Deleted

## 2015-02-05 DIAGNOSIS — R2681 Unsteadiness on feet: Secondary | ICD-10-CM | POA: Diagnosis not present

## 2015-02-05 NOTE — Therapy (Signed)
[facility] [place], [place], [address] Phone: [phone]   Fax:  [phone]  Physical Therapy Treatment  Patient Details  Name: Jimmy Park MRN: [id number] Date of Birth: 01-12-1951 Referring Provider:  Claretta Fraise, MD  Encounter Date: 02/05/2015      PT End of Session - 02/05/15 1130    Visit Number 9   Number of Visits 13   Date for PT Re-Evaluation 02/20/15   PT Start Time 0945   PT Stop Time 7253   PT Time Calculation (min) 49 min      Past Medical History  Diagnosis Date  . Anemia   . Asthma   . Hypertension   . Allergy   . Cerebral ventriculomegaly   . B12 deficiency   . Gait abnormality     Past Surgical History  Procedure Laterality Date  . Lumbar drain implantation      There were no vitals filed for this visit.  Visit Diagnosis:  Unsteadiness      Subjective Assessment - 02/05/15 1036    Subjective Pateint had no complaints after last treatment, doing ok   Patient is accompained by: Family member   Limitations Walking   Patient Stated Goals Walk better and more stable.                         [facility] Adult PT Treatment/Exercise - 02/05/15 0001    Ambulation/Gait   Ambulation Distance (Feet) 1000 Feet   Assistive device 1 person hand held assist   Ambulation Surface Level  carpet   Gait Comments Slow and purposeful gait pattern with a straight cane.  The patient exhibits a shortened step and stride length and a decrease in toe clearance (though no toe drg occurred).   Exercises   Exercises Knee/Hip   Knee/Hip Exercises: Aerobic   Nustep 15 min L5 LE/UEs monitored for tolerance/progression   Knee/Hip Exercises: Standing   Rocker Board 4 minutes  balance and calf stretching   Other Standing Knee Exercises Stepovers in hallway HHA laps and standing Narrow BOS eyes open/closed   Other Standing Knee Exercises Balance manual rhythmic stab on airex and floor              Balance Exercises - 02/05/15 1039    Balance Exercises: Standing   Step Ups Forward;6 inch  CGA toe touches both LEs   Other Standing Exercises Diagonal UE reaching on/off balance pad                PT Long Term Goals - 01/24/15 1014    PT LONG TERM GOAL #1   Title Ind with HEP.   Time 6   Period Weeks   Status On-going   PT LONG TERM GOAL #2   Title Improve Berg score to 50/56.   Time 6   Period Weeks   Status On-going  47/56   PT LONG TERM GOAL #3   Title Normal step and stride length with gait pattern and adequate toe clearance.   Time 6   Period Weeks   Status On-going               Plan - 02/05/15 1131    Clinical Impression Statement Pt did well again with balance exs and gait acts. He doesn't use his cane at home now, but mainly when out in public. His LT foot  still drags at times during gait, but is inconsistant. Definantly more noticable  at the end of Rx due to fatigue   Pt will benefit from skilled therapeutic intervention in order to improve on the following deficits Decreased balance;Difficulty walking   Rehab Potential Good   PT Frequency 3x / week   PT Duration 4 weeks   PT Treatment/Interventions Gait training;Neuromuscular re-education;Balance training;Therapeutic exercise   PT Next Visit Plan cont with POC with balance, gait, endurance,  Berg test cone step overs   Consulted and Agree with Plan of Care Patient        Problem List Patient Active Problem List   Diagnosis Date Noted  . B12 deficiency 03/25/2012  . Cerebral ventriculomegaly 03/25/2012  . Abnormal gait 03/25/2012  . Asthma, intermittent 12/17/2010  . Cardiomyopathy, hypertrophic obstructive 06/24/2006  . Allergic rhinitis 02/24/2001  . Benign essential HTN 02/24/2001    Finian Helvey,CHRIS, PTA 02/05/2015, 11:46 AM  [facility] [address] [place], [place], [address] Phone: [phone]   Fax:   [phone]

## 2015-02-07 ENCOUNTER — Ambulatory Visit: Payer: Medicare Other | Attending: Family Medicine | Admitting: Physical Therapy

## 2015-02-07 ENCOUNTER — Encounter: Payer: Self-pay | Admitting: Physical Therapy

## 2015-02-07 DIAGNOSIS — R2681 Unsteadiness on feet: Secondary | ICD-10-CM | POA: Insufficient documentation

## 2015-02-07 NOTE — Therapy (Signed)
[facility] [place], [place], [address] Phone: [phone]   Fax:  [phone]  Physical Therapy Treatment  Patient Details  Name: Jimmy Park MRN: [id number] Date of Birth: 13-Sep-1950 Referring Provider:  Claretta Fraise, MD  Encounter Date: 02/07/2015      PT End of Session - 02/07/15 1016    Visit Number 10   Number of Visits 13   Date for PT Re-Evaluation 02/20/15   PT Start Time 0948   PT Stop Time 1029   PT Time Calculation (min) 41 min   Activity Tolerance Patient tolerated treatment well   Behavior During Therapy [facility] for tasks assessed/performed      Past Medical History  Diagnosis Date  . Anemia   . Asthma   . Hypertension   . Allergy   . Cerebral ventriculomegaly   . B12 deficiency   . Gait abnormality     Past Surgical History  Procedure Laterality Date  . Lumbar drain implantation      There were no vitals filed for this visit.  Visit Diagnosis:  Unsteadiness      Subjective Assessment - 02/07/15 0954    Subjective Pateint had no complaints after last treatment, doing ok   Limitations Walking   Patient Stated Goals Walk better and more stable.   Currently in Pain? No/denies            [facility] PT Assessment - 02/07/15 0001    Berg Balance Test   Sit to Stand Able to stand without using hands and stabilize independently   Standing Unsupported Able to stand safely 2 minutes   Sitting with Back Unsupported but Feet Supported on Floor or Stool Able to sit safely and securely 2 minutes   Stand to Sit Sits safely with minimal use of hands   Transfers Able to transfer safely, minor use of hands   Standing Unsupported with Eyes Closed Able to stand 10 seconds safely   Standing Ubsupported with Feet Together Able to place feet together independently and stand 1 minute safely   From Standing, Reach Forward with Outstretched Arm Can reach confidently >25 cm (10")   From Standing Position, Pick  up Object from Floor Able to pick up shoe safely and easily   From Standing Position, Turn to Look Behind Over each Shoulder Looks behind from both sides and weight shifts well   Turn 360 Degrees Able to turn 360 degrees safely one side only in 4 seconds or less   Standing Unsupported, Alternately Place Feet on Step/Stool Able to complete 4 steps without aid or supervision   Standing Unsupported, One Foot in Front Able to take small step independently and hold 30 seconds   Standing on One Leg Able to lift leg independently and hold equal to or more than 3 seconds   Total Score 49                     [facility] Adult PT Treatment/Exercise - 02/07/15 0001    Knee/Hip Exercises: Aerobic   Nustep 15 min L5 LE/UEs monitored for tolerance/progression   Knee/Hip Exercises: Standing   Heel Raises Both;3 sets;20 reps   Hip Flexion AROM;3 sets;10 reps             Balance Exercises - 02/07/15 1009    Balance Exercises: Standing   Standing Eyes Opened Narrow base of support (BOS);2 reps;30 secs;Time   Tandem Stance Eyes open;4 reps;30 secs;Intermittent upper extremity support   Rockerboard  Anterior/posterior  43min   Step Ups Forward;6 inch  3x10                PT Long Term Goals - 02/07/15 1017    PT LONG TERM GOAL #1   Title Ind with HEP.   Time 6   Period Weeks   Status On-going   PT LONG TERM GOAL #2   Title Improve Berg score to 50/56.   Time 6   Period Weeks   Status On-going  49/56   PT LONG TERM GOAL #3   Title Normal step and stride length with gait pattern and adequate toe clearance.   Time 6   Period Weeks   Status On-going               Plan - 02/07/15 1018    Clinical Impression Statement Patient progressing with all strength and balance avtivities. Patient has had no reports of falls or LOB reported. Patient improved BERG score today to 49/56. Goals progressing yet ongoing due to balance and gait limitations   Pt will benefit from  skilled therapeutic intervention in order to improve on the following deficits Decreased balance;Difficulty walking   Rehab Potential Good   PT Frequency 3x / week   PT Duration 4 weeks   PT Treatment/Interventions Gait training;Neuromuscular re-education;Balance training;Therapeutic exercise   PT Next Visit Plan cont with POC with balance, gait, endurance,  Berg test cone step overs   Consulted and Agree with Plan of Care Patient        Problem List Patient Active Problem List   Diagnosis Date Noted  . B12 deficiency 03/25/2012  . Cerebral ventriculomegaly 03/25/2012  . Abnormal gait 03/25/2012  . Asthma, intermittent 12/17/2010  . Cardiomyopathy, hypertrophic obstructive 06/24/2006  . Allergic rhinitis 02/24/2001  . Benign essential HTN 02/24/2001    Giorgi Debruin P, PTA 02/07/2015, 10:30 AM  [facility] [place], [place], [address] Phone: [phone]   Fax:  [phone]

## 2015-02-14 ENCOUNTER — Encounter: Payer: Self-pay | Admitting: Physical Therapy

## 2015-02-14 ENCOUNTER — Ambulatory Visit: Payer: Medicare Other | Admitting: Physical Therapy

## 2015-02-14 DIAGNOSIS — R2681 Unsteadiness on feet: Secondary | ICD-10-CM | POA: Diagnosis not present

## 2015-02-14 NOTE — Therapy (Signed)
[facility] [place], [place], [address] Phone: [phone]   Fax:  [phone]  Physical Therapy Treatment  Patient Details  Name: Jimmy Park MRN: [id number] Date of Birth: [birth year] Referring Provider:  Claretta Fraise, MD  Encounter Date: 02/14/2015      PT End of Session - 02/14/15 1051    Visit Number 11   Number of Visits 13   Date for PT Re-Evaluation 02/20/15   PT Start Time 8182   PT Stop Time 1111   PT Time Calculation (min) 42 min   Activity Tolerance Patient tolerated treatment well   Behavior During Therapy [facility] for tasks assessed/performed      Past Medical History  Diagnosis Date  . Anemia   . Asthma   . Hypertension   . Allergy   . Cerebral ventriculomegaly   . B12 deficiency   . Gait abnormality     Past Surgical History  Procedure Laterality Date  . Lumbar drain implantation      There were no vitals filed for this visit.  Visit Diagnosis:  Unsteadiness      Subjective Assessment - 02/14/15 1042    Subjective Pateint had no complaints after last treatment, doing ok and has reported no falls   Limitations Walking   Patient Stated Goals Walk better and more stable.   Currently in Pain? No/denies                         [facility] Adult PT Treatment/Exercise - 02/14/15 0001    Knee/Hip Exercises: Aerobic   Nustep 15 min L5 LE/UEs monitored for tolerance/progression   Knee/Hip Exercises: Standing   Hip Flexion AROM;3 sets;10 reps             Balance Exercises - 02/14/15 1052    Balance Exercises: Standing   Tandem Stance Eyes open;Intermittent upper extremity support;4 reps   Rockerboard Anterior/posterior  19mn   Step Ups Forward;6 inch  3x10   Step Over Hurdles / Cones step over cones using SPC/SBA    Numbers 1-15 4 reps  solid surface 5 cones   Cone Rotation Solid surface;Right turn;Left turn  SPC/SBA   Other Standing Exercises airex SBOS with 2#  reachouts and D1D2 2x10 each                PT Long Term Goals - 02/14/15 1047    PT LONG TERM GOAL #1   Title Ind with HEP.   Time 6   Period Weeks   Status On-going   PT LONG TERM GOAL #2   Title Improve Berg score to 50/56.   Time 6   Period Weeks   Status On-going  49   PT LONG TERM GOAL #3   Title Normal step and stride length with gait pattern and adequate toe clearance.   Time 6   Period Weeks   Status Partially Met  decreased step and stride length yet adequate toe clearance               Plan - 02/14/15 1110    Clinical Impression Statement patient progressing with all activities and has improved gait cycle with improved toe clearance yet continues to have decreased step and stride length. Patient able to negotiate cones with step overs and around. Goals ongoing due to balance and gait deficits.   Pt will benefit from skilled therapeutic intervention in order to improve on the following deficits Decreased balance;Difficulty walking  Rehab Potential Good   PT Frequency 3x / week   PT Duration 4 weeks   PT Treatment/Interventions Gait training;Neuromuscular re-education;Balance training;Therapeutic exercise   PT Next Visit Plan cont with POC with balance, gait, endurance,  Berg test cone step overs   PT Home Exercise Plan F/U  some gait exercises next visit.        Problem List Patient Active Problem List   Diagnosis Date Noted  . B12 deficiency 03/25/2012  . Cerebral ventriculomegaly 03/25/2012  . Abnormal gait 03/25/2012  . Asthma, intermittent 12/17/2010  . Cardiomyopathy, hypertrophic obstructive 06/24/2006  . Allergic rhinitis 02/24/2001  . Benign essential HTN 02/24/2001    Donyetta Ogletree P, PTA 02/14/2015, 11:15 AM  [facility] [address] [place], [place], [address] Phone: [phone]   Fax:  [phone]

## 2015-02-20 ENCOUNTER — Encounter: Payer: Self-pay | Admitting: Physical Therapy

## 2015-02-20 ENCOUNTER — Ambulatory Visit: Payer: Medicare Other | Admitting: Physical Therapy

## 2015-02-20 DIAGNOSIS — R2681 Unsteadiness on feet: Secondary | ICD-10-CM

## 2015-02-20 NOTE — Patient Instructions (Signed)
   Bridging  Slowly raise buttocks from floor, keeping stomach tight. Repeat _10___ times per set. Do __2__ sets per session. Do __2__ sessions per day            Heel Raise: Bilateral (Standing)   Rise on balls of feet. Hold on to table for safety Repeat _10___ times per set. Do _2-3___ sets per session. Do __2__ sessions per day.  http://orth.exer.us/38   Copyright  VHI. All rights reserved.

## 2015-02-20 NOTE — Therapy (Signed)
[facility] [place], [place], [address] Phone: [phone]   Fax:  [phone]  Physical Therapy Treatment  Patient Details  Name: Jimmy Park MRN: [id number] Date of Birth: 1950/09/08 Referring Provider:  Claretta Fraise, MD  Encounter Date: 02/20/2015      PT End of Session - 02/20/15 1137    Visit Number 12   Number of Visits 13   Date for PT Re-Evaluation 02/20/15   PT Start Time 1113   PT Stop Time 1153   PT Time Calculation (min) 40 min   Activity Tolerance Patient tolerated treatment well   Behavior During Therapy [facility] for tasks assessed/performed      Past Medical History  Diagnosis Date  . Anemia   . Asthma   . Hypertension   . Allergy   . Cerebral ventriculomegaly   . B12 deficiency   . Gait abnormality     Past Surgical History  Procedure Laterality Date  . Lumbar drain implantation      There were no vitals filed for this visit.  Visit Diagnosis:  Unsteadiness      Subjective Assessment - 02/20/15 1129    Subjective Pateint had no complaints after last treatment, doing ok and has reported no falls   Limitations Walking   Patient Stated Goals Walk better and more stable.   Currently in Pain? No/denies                         [facility] Adult PT Treatment/Exercise - 02/20/15 0001    Knee/Hip Exercises: Aerobic   Nustep 15 min L5 LE/UEs monitored for tolerance/progression   Knee/Hip Exercises: Standing   Heel Raises Both;3 sets;10 reps   Hip Flexion AROM;3 sets;10 reps   Knee/Hip Exercises: Supine   Other Supine Knee/Hip Exercises hip bridges for core strength 2x10             Balance Exercises - 02/20/15 1133    Balance Exercises: Standing   Tandem Stance Eyes open;Intermittent upper extremity support;4 reps   Rockerboard Anterior/posterior  min   Step Ups Forward;6 inch  3x10   Other Standing Exercises airex SBOS with 2# reachouts and D1D2 2x10 each           PT Education - 02/20/15 1150    Education provided Yes   Education Details HEP   Person(s) Educated Patient   Methods Explanation;Demonstration   Comprehension Verbalized understanding;Returned demonstration             PT Long Term Goals - 02/20/15 1150    PT LONG TERM GOAL #1   Title Ind with HEP.   Time 6   Period Weeks   Status Achieved   PT LONG TERM GOAL #2   Title Improve Berg score to 50/56.   Time 6   Period Weeks   Status On-going   PT LONG TERM GOAL #3   Title Normal step and stride length with gait pattern and adequate toe clearance.   Time 6   Status Partially Met               Plan - 02/20/15 1138    Clinical Impression Statement Patient continues to progress with all activities and has reported no falls or LOB. Patient has improved with balance and proprioception exercises. LTG #1 met other Goals ongoing due to gait limitations.   Pt will benefit from skilled therapeutic intervention in order to improve on the following deficits Decreased balance;Difficulty  walking   Rehab Potential Good   PT Frequency 3x / week   PT Duration 4 weeks   PT Treatment/Interventions Gait training;Neuromuscular re-education;Balance training;Therapeutic exercise   PT Next Visit Plan cont 1 visit DC per patient/MPT   Consulted and Agree with Plan of Care Patient        Problem List Patient Active Problem List   Diagnosis Date Noted  . B12 deficiency 03/25/2012  . Cerebral ventriculomegaly 03/25/2012  . Abnormal gait 03/25/2012  . Asthma, intermittent 12/17/2010  . Cardiomyopathy, hypertrophic obstructive 06/24/2006  . Allergic rhinitis 02/24/2001  . Benign essential HTN 02/24/2001    Cami Delawder P, PTA 02/20/2015, 11:53 AM  [facility] [address] [place], [place], [address] Phone: [phone]   Fax:  [phone]

## 2015-02-22 ENCOUNTER — Ambulatory Visit: Payer: Medicare Other | Admitting: Physical Therapy

## 2015-02-22 ENCOUNTER — Encounter: Payer: Self-pay | Admitting: Physical Therapy

## 2015-02-22 DIAGNOSIS — R2681 Unsteadiness on feet: Secondary | ICD-10-CM | POA: Diagnosis not present

## 2015-02-22 NOTE — Therapy (Signed)
[facility] [place], [place], [address] Phone: [phone]   Fax:  [phone]  Physical Therapy Treatment  Patient Details  Name: Jimmy Park MRN: [id number] Date of Birth: 12-Jun-1950 Referring Provider:  Claretta Fraise, MD  Encounter Date: 02/22/2015      PT End of Session - 02/22/15 0948    Visit Number 13   Number of Visits 13   Date for PT Re-Evaluation 02/20/15   PT Start Time 0944   PT Stop Time 1029   PT Time Calculation (min) 45 min   Equipment Utilized During Treatment Other (comment)  SPC   Activity Tolerance Patient tolerated treatment well   Behavior During Therapy [facility] for tasks assessed/performed      Past Medical History  Diagnosis Date  . Anemia   . Asthma   . Hypertension   . Allergy   . Cerebral ventriculomegaly   . B12 deficiency   . Gait abnormality     Past Surgical History  Procedure Laterality Date  . Lumbar drain implantation      There were no vitals filed for this visit.  Visit Diagnosis:  Unsteadiness      Subjective Assessment - 02/22/15 0949    Subjective Reports no falls at home. Wife reports that he still wants to drag L foot at times more in the afternoon although MD said he would never be 100%.   Patient is accompained by: Family member   Limitations Walking   Patient Stated Goals Walk better and more stable.   Currently in Pain? No/denies                         [facility] Adult PT Treatment/Exercise - 02/22/15 0001    Ambulation/Gait   Gait Comments Patient ambulates at a good rate that is slower than a normal speed with SPC with normal stride length and step length. Patient has to remind himself to flex L hip to advance it selfly for foot clearance   Balance   Balance Assessed Yes   Standardized Balance Assessment   Standardized Balance Assessment Berg Balance Test   Berg Balance Test   Sit to Stand Able to stand without using hands and stabilize  independently   Standing Unsupported Able to stand safely 2 minutes   Sitting with Back Unsupported but Feet Supported on Floor or Stool Able to sit safely and securely 2 minutes   Stand to Sit Sits safely with minimal use of hands   Transfers Able to transfer safely, minor use of hands   Standing Unsupported with Eyes Closed Able to stand 10 seconds safely   Standing Ubsupported with Feet Together Able to place feet together independently and stand 1 minute safely   From Standing, Reach Forward with Outstretched Arm Can reach confidently >25 cm (10")   From Standing Position, Pick up Object from Floor Able to pick up shoe safely and easily   From Standing Position, Turn to Look Behind Over each Shoulder Looks behind from both sides and weight shifts well   Turn 360 Degrees Able to turn 360 degrees safely in 4 seconds or less   Standing Unsupported, Alternately Place Feet on Step/Stool Able to stand independently and safely and complete 8 steps in 20 seconds   Standing Unsupported, One Foot in Front Able to plae foot ahead of the other independently and hold 30 seconds   Standing on One Leg Able to lift leg independently and hold 5-10  seconds   Total Score 54   Knee/Hip Exercises: Aerobic   Nustep L6 x15 min LE/UE             Balance Exercises - 02/22/15 1021    Balance Exercises: Standing   Tandem Stance Eyes open;Foam/compliant surface;Intermittent upper extremity support;Other reps (comment)  reachouts, D2 x25 reps each   SLS Eyes open;Solid surface;Intermittent upper extremity support;Upper extremity support 1;Other reps (comment)  Several reps for as long as he could   Rockerboard Lateral;EO;Intermittent UE support  x5 min   Step Ups Forward;Intermittent UE support  8 in toe taps x15 rpeps                PT Long Term Goals - 02/22/15 1014    PT LONG TERM GOAL #1   Title Ind with HEP.   Time 6   Period Weeks   Status Achieved   PT LONG TERM GOAL #2   Title  Improve Berg score to 50/56.   Time 6   Period Weeks   Status Achieved  BERG score 54/56 02/22/2015   PT LONG TERM GOAL #3   Title Normal step and stride length with gait pattern and adequate toe clearance.   Time 6   Status Achieved               Plan - 02/22/15 1033    Clinical Impression Statement Patient completed PT well with no complaints during today's treatment and has been able to achieve all goals set at evaluation. Reports no falls at home and continues to use [facility] for ambulation. Verbalized to PTA that he has to remind LLE to pick up itself and that is mostly when he is tired. Has some difficulty with tandem stance as well as SLS activities. For all tandem exercises is modified to semi-tandem stance. Denied pain following treatment.   Pt will benefit from skilled therapeutic intervention in order to improve on the following deficits Decreased balance;Difficulty walking   Rehab Potential Good   PT Frequency 3x / week   PT Duration 4 weeks   PT Treatment/Interventions Gait training;Neuromuscular re-education;Balance training;Therapeutic exercise   PT Next Visit Plan Communicate to MPT need for D/C note.   Consulted and Agree with Plan of Care Patient        Problem List Patient Active Problem List   Diagnosis Date Noted  . B12 deficiency 03/25/2012  . Cerebral ventriculomegaly 03/25/2012  . Abnormal gait 03/25/2012  . Asthma, intermittent 12/17/2010  . Cardiomyopathy, hypertrophic obstructive 06/24/2006  . Allergic rhinitis 02/24/2001  . Benign essential HTN 02/24/2001    Ahmed Prima, PTA 02/22/2015 10:44 AM  [facility] [address] [place], [place], [address] Phone: [phone]   Fax:  [phone]

## 2015-02-25 DIAGNOSIS — R2681 Unsteadiness on feet: Secondary | ICD-10-CM | POA: Diagnosis not present

## 2015-02-25 NOTE — Therapy (Addendum)
[facility] [place], [place], [address] Phone: [phone]   Fax:  [phone]  Physical Therapy Treatment  Patient Details  Name: Jimmy Park MRN: [id number] Date of Birth: April 15, 1951 Referring Provider:  Claretta Fraise, MD  Encounter Date: 02/22/2015    Past Medical History  Diagnosis Date  . Anemia   . Asthma   . Hypertension   . Allergy   . Cerebral ventriculomegaly   . B12 deficiency   . Gait abnormality     Past Surgical History  Procedure Laterality Date  . Lumbar drain implantation      There were no vitals filed for this visit.  Visit Diagnosis:  Unsteadiness                                    PT Long Term Goals - 02/22/15 1014    PT LONG TERM GOAL #1   Title Ind with HEP.   Time 6   Period Weeks   Status Achieved   PT LONG TERM GOAL #2   Title Improve Berg score to 50/56.   Time 6   Period Weeks   Status Achieved  BERG score 54/56 02/22/2015   PT LONG TERM GOAL #3   Title Normal step and stride length with gait pattern and adequate toe clearance.   Time 6   Status Achieved                 G-Codes - 2015-03-02 1806    Functional Assessment Tool Used Clinical judgement.   Functional Limitation Mobility: Walking and moving around   Mobility: Walking and Moving Around Current Status [phone]) At least 1 percent but less than 20 percent impaired, limited or restricted   Mobility: Walking and Moving Around Goal Status [phone]) At least 1 percent but less than 20 percent impaired, limited or restricted   Mobility: Walking and Moving Around Discharge Status [phone]) At least 1 percent but less than 20 percent impaired, limited or restricted      Problem List Patient Active Problem List   Diagnosis Date Noted  . B12 deficiency 03/25/2012  . Cerebral ventriculomegaly 03/25/2012  . Abnormal gait 03/25/2012  . Asthma, intermittent 12/17/2010  .  Cardiomyopathy, hypertrophic obstructive 06/24/2006  . Allergic rhinitis 03-01-2001  . Benign essential HTN 03/01/01   PHYSICAL THERAPY DISCHARGE SUMMARY  Visits from Start of Care: 13  Current functional level related to goals / functional outcomes: Please see above.   Remaining deficits: None reported.   Education / Equipment: HEP. Plan: Patient agrees to discharge.  Patient goals were met. Patient is being discharged due to meeting the stated rehab goals.  ?????      Walta [place], [place] MPT March 02, 2015, 6:07 PM  [facility] [address] [place], [place], [address] Phone: [phone]   Fax:  [phone]

## 2015-03-27 ENCOUNTER — Ambulatory Visit (INDEPENDENT_AMBULATORY_CARE_PROVIDER_SITE_OTHER): Payer: Medicare Other | Admitting: Family Medicine

## 2015-03-27 ENCOUNTER — Encounter: Payer: Self-pay | Admitting: Family Medicine

## 2015-03-27 VITALS — BP 135/81 | HR 72 | Temp 97.1°F | Ht 67.0 in | Wt 171.6 lb

## 2015-03-27 DIAGNOSIS — Z23 Encounter for immunization: Secondary | ICD-10-CM | POA: Diagnosis not present

## 2015-03-27 DIAGNOSIS — H65191 Other acute nonsuppurative otitis media, right ear: Secondary | ICD-10-CM | POA: Diagnosis not present

## 2015-03-27 MED ORDER — AMOXICILLIN 500 MG PO CAPS: 500.0000 mg | ORAL_CAPSULE | Freq: Three times a day (TID) | ORAL | Status: DC

## 2015-03-27 MED ORDER — OFLOXACIN 0.3 % OT SOLN: 2.0000 [drp] | Freq: Every day | OTIC | Status: DC

## 2015-03-27 NOTE — Progress Notes (Signed)
   Subjective:    Patient ID: Jimmy Park, male    DOB: [birth year], 64 y.o.   MRN: [id number]  HPI 64 year old gentleman with some right ear pain. He has chronic tinnitus by history. There is no hearing loss. There is been no fever. He did have some dental work prior to this raising the possibility of some TMJ.    Review of Systems  Constitutional: Negative.   HENT: Positive for ear pain.   Respiratory: Negative.   Cardiovascular: Negative.   Psychiatric/Behavioral: Negative.        Patient Active Problem List   Diagnosis Date Noted  . B12 deficiency 03/25/2012  . Cerebral ventriculomegaly 03/25/2012  . Abnormal gait 03/25/2012  . Asthma, intermittent 12/17/2010  . Cardiomyopathy, hypertrophic obstructive ([place]) 06/24/2006  . Allergic rhinitis 02/24/2001  . Benign essential HTN 02/24/2001   Outpatient Encounter Prescriptions as of 03/27/2015  Medication Sig  . albuterol (PROVENTIL HFA;VENTOLIN HFA) 108 (90 BASE) MCG/ACT inhaler Inhale 1 puff into the lungs every 4 (four) hours as needed.  [name] aspirin 81 MG tablet Take 1 tablet by mouth daily.  . cyanocobalamin (,VITAMIN B-12,) 1000 MCG/ML injection Inject 1 mL (1,000 mcg total) into the muscle every 30 (thirty) days.  . DHA-EPA-VITAMIN E PO Take 1 tablet by mouth daily.  [name] Sodium 100 MG capsule Take 1 tablet by mouth at bedtime.  . fexofenadine (ALLEGRA) 180 MG tablet Take 1 tablet (180 mg total) by mouth daily.  . folic acid (FOLVITE) 1 MG tablet Take 1 tablet by mouth daily.  [name] ibuprofen (ADVIL,MOTRIN) 400 MG tablet Take 1 tablet by mouth 2 (two) times daily as needed.  [name] lisinopril (PRINIVIL,ZESTRIL) 20 MG tablet Take 1 tablet (20 mg total) by mouth daily.  . metoprolol tartrate (LOPRESSOR) 25 MG tablet Take 1 tablet (25 mg total) by mouth daily.  . montelukast (SINGULAIR) 10 MG tablet Take 1 tablet (10 mg total) by mouth daily.  [name] omeprazole (PRILOSEC) 40 MG capsule Take 40 mg by mouth daily.   [name] amoxicillin  (AMOXIL) 500 MG capsule Take 1 capsule (500 mg total) by mouth 3 (three) times daily.  [name] ofloxacin (FLOXIN OTIC) 0.3 % otic solution Place 2 drops into the right ear daily.   No facility-administered encounter medications on file as of 03/27/2015.    Objective:   Physical Exam  Constitutional: He appears well-developed and well-nourished.  HENT:  Left ear including external canal and tympanic membrane appear normal. Right ear there is inflammation of the external canal as well as tenderness on traction of the ear and the eardrum is retracted and has a reddish hue; it is not bulging.          Assessment & Plan:  1. Encounter for immunization Flu shot given  2. Acute nonsuppurative otitis media of right ear Rx amoxicillin 500 mg 3 times a day for 10 days; Floxin Otic 2 drops twice a day for 5 days  Wardell Honour MD

## 2015-06-03 ENCOUNTER — Other Ambulatory Visit: Payer: Self-pay | Admitting: Family Medicine

## 2015-06-11 ENCOUNTER — Encounter: Payer: Self-pay | Admitting: Family Medicine

## 2015-06-11 ENCOUNTER — Ambulatory Visit (INDEPENDENT_AMBULATORY_CARE_PROVIDER_SITE_OTHER): Payer: Medicare Other | Admitting: Family Medicine

## 2015-06-11 VITALS — BP 123/74 | HR 65 | Temp 97.0°F | Ht 67.0 in | Wt 176.0 lb

## 2015-06-11 DIAGNOSIS — R269 Unspecified abnormalities of gait and mobility: Secondary | ICD-10-CM

## 2015-06-11 DIAGNOSIS — I10 Essential (primary) hypertension: Secondary | ICD-10-CM | POA: Diagnosis not present

## 2015-06-11 DIAGNOSIS — G9389 Other specified disorders of brain: Secondary | ICD-10-CM | POA: Diagnosis not present

## 2015-06-11 MED ORDER — ALBUTEROL SULFATE HFA 108 (90 BASE) MCG/ACT IN AERS: 1.0000 | INHALATION_SPRAY | RESPIRATORY_TRACT | Status: DC | PRN

## 2015-06-11 NOTE — Progress Notes (Signed)
Subjective:  Patient ID: Jimmy Park, male    DOB: Jan 09, 1951  Age: 65 y.o. MRN: [id number]  CC: disability paperwork   HPI Jimmy Park presents for review of his disability. He is having problems with coordination and gait. He has to walk with a cane is unsteady. Although these had no falls he has had multiple episodes with a near fall saved by using his cane. He has difficulty with fine motor movements. She cannot stand for more than a few moments. He has a history of cerebral ventriculomegaly that has affected his cognition. He cannot remember the events between 2-48 hours ago. He does know basics of orientation including person place and time. He counted backwards by sevens from 100 and is seeing only one.  History Jimmy Park has a past medical history of Anemia; Asthma; Hypertension; Allergy; Cerebral ventriculomegaly; B12 deficiency; and Gait abnormality.   He has past surgical history that includes Lumbar drain implantation.   His family history includes Asthma in his brother; Diabetes in his father and mother; Hypertension in his father and mother.He reports that he quit smoking about 42 years ago. His smoking use included Cigarettes. He started smoking about 48 years ago. He smoked 0.50 packs per day. He does not have any smokeless tobacco history on file. He reports that he drinks alcohol. His drug history is not on file.  Outpatient Prescriptions Prior to Visit  Medication Sig Dispense Refill  . aspirin 81 MG tablet Take 1 tablet by mouth daily.    . cyanocobalamin (,VITAMIN B-12,) 1000 MCG/ML injection Inject 1 mL (1,000 mcg total) into the muscle every 30 (thirty) days. 1 mL 11  . DHA-EPA-VITAMIN E PO Take 1 tablet by mouth daily.    [name] Sodium 100 MG capsule Take 1 tablet by mouth at bedtime.    . fexofenadine (ALLEGRA) 180 MG tablet TAKE 1 TABLET (180 MG TOTAL) BY MOUTH DAILY. 30 tablet 3  . folic acid (FOLVITE) 1 MG tablet Take 1 tablet by mouth daily.    [name] ibuprofen  (ADVIL,MOTRIN) 400 MG tablet Take 1 tablet by mouth 2 (two) times daily as needed.    [name] lisinopril (PRINIVIL,ZESTRIL) 20 MG tablet Take 1 tablet (20 mg total) by mouth daily. 90 tablet 4  . metoprolol tartrate (LOPRESSOR) 25 MG tablet Take 1 tablet (25 mg total) by mouth daily. 90 tablet 4  . montelukast (SINGULAIR) 10 MG tablet Take 1 tablet (10 mg total) by mouth daily. 90 tablet 4  . omeprazole (PRILOSEC) 40 MG capsule Take 40 mg by mouth daily.     [name] albuterol (PROVENTIL HFA;VENTOLIN HFA) 108 (90 BASE) MCG/ACT inhaler Inhale 1 puff into the lungs every 4 (four) hours as needed.    [name] amoxicillin (AMOXIL) 500 MG capsule Take 1 capsule (500 mg total) by mouth 3 (three) times daily. 30 capsule 0  . ofloxacin (FLOXIN OTIC) 0.3 % otic solution Place 2 drops into the right ear daily. 5 mL 0   No facility-administered medications prior to visit.    ROS Review of Systems  Constitutional: Negative for fever, chills, diaphoresis and unexpected weight change.  HENT: Negative for congestion, hearing loss, rhinorrhea and sore throat.   Eyes: Negative for visual disturbance.  Respiratory: Negative for cough and shortness of breath.   Cardiovascular: Negative for chest pain.  Gastrointestinal: Negative for abdominal pain, diarrhea and constipation.  Genitourinary: Negative for dysuria and flank pain.  Musculoskeletal: Negative for joint swelling and arthralgias.  Skin: Negative for rash.  Neurological: Negative for dizziness and headaches.  Psychiatric/Behavioral: Positive for confusion (per wife's history.) and decreased concentration. Negative for sleep disturbance and dysphoric mood.    Objective:  BP 123/74 mmHg  Pulse 65  Temp(Src) 97 F (36.1 C) (Oral)  Ht 5\' 7"  (1.702 m)  Wt 176 lb (79.833 kg)  BMI 27.56 kg/m2  BP Readings from Last 3 Encounters:  06/11/15 123/74  03/27/15 135/81  12/27/14 132/76    Wt Readings from Last 3 Encounters:  06/11/15 176 lb (79.833 kg)  03/27/15 171  lb 9.6 oz (77.837 kg)  12/27/14 166 lb (75.297 kg)     Physical Exam  Constitutional: He is oriented to person, place, and time. He appears well-developed and well-nourished. No distress.  HENT:  Head: Normocephalic and atraumatic.  Right Ear: External ear normal.  Left Ear: External ear normal.  Nose: Nose normal.  Mouth/Throat: Oropharynx is clear and moist.  Eyes: Conjunctivae and EOM are normal. Pupils are equal, round, and reactive to light.  Neck: Normal range of motion. Neck supple. No thyromegaly present.  Cardiovascular: Normal rate, regular rhythm and normal heart sounds.   No murmur heard. Pulmonary/Chest: Effort normal and breath sounds normal. No respiratory distress. He has no wheezes. He has no rales.  Abdominal: Soft. Bowel sounds are normal. He exhibits no distension. There is no tenderness.  Musculoskeletal:  Gait is slow and he has some white base with lack of coordination.  Lymphadenopathy:    He has no cervical adenopathy.  Neurological: He is alert and oriented to person, place, and time. He has normal reflexes.  Skin: Skin is warm and dry.  Psychiatric: He has a normal mood and affect. His behavior is normal. Judgment and thought content normal.     Lab Results  Component Value Date   WBC 6.1 09/07/2014   HGB 14.4 09/07/2014   HCT 41.7 09/07/2014   PLT 225 09/07/2014   GLUCOSE 89 09/07/2014   CHOL 168 09/07/2014   TRIG 110 09/07/2014   HDL 39* 09/07/2014   ALT 30 09/07/2014   AST 24 09/07/2014   NA 142 09/07/2014   K 4.3 09/07/2014   CL 99 09/07/2014   CREATININE 0.88 09/07/2014   BUN 20 09/07/2014   CO2 23 09/07/2014   TSH 2.980 09/07/2014   PSA 0.3 09/07/2014    Patient was never admitted.  Assessment & Plan:   Jimmy Park was seen today for disability paperwork.  Diagnoses and all orders for this visit:  Cerebral ventriculomegaly  Abnormal gait  Essential hypertension  Other orders -     albuterol (PROVENTIL HFA;VENTOLIN HFA) 108  (90 Base) MCG/ACT inhaler; Inhale 1 puff into the lungs every 4 (four) hours as needed.   I have discontinued Mr. Andreola amoxicillin and ofloxacin. I am also having him maintain his aspirin, Docusate Sodium, folic acid, DHA-EPA-VITAMIN E PO, ibuprofen, lisinopril, metoprolol tartrate, montelukast, cyanocobalamin, omeprazole, fexofenadine, and albuterol.  Meds ordered this encounter  Medications  . albuterol (PROVENTIL HFA;VENTOLIN HFA) 108 (90 Base) MCG/ACT inhaler    Sig: Inhale 1 puff into the lungs every 4 (four) hours as needed.    Dispense:  1 Inhaler    Refill:  3     Follow-up: Return in about 3 months (around 09/09/2015).  Claretta Fraise, M.D.

## 2015-09-24 ENCOUNTER — Other Ambulatory Visit: Payer: Self-pay | Admitting: Family Medicine

## 2015-10-07 ENCOUNTER — Other Ambulatory Visit: Payer: Self-pay | Admitting: Family Medicine

## 2015-10-08 ENCOUNTER — Other Ambulatory Visit: Payer: Self-pay | Admitting: Family Medicine

## 2015-10-08 ENCOUNTER — Telehealth: Payer: Self-pay | Admitting: Family Medicine

## 2015-10-08 NOTE — Telephone Encounter (Signed)
rx already sent to pharmacy

## 2015-11-19 ENCOUNTER — Encounter: Payer: Self-pay | Admitting: Physician Assistant

## 2015-11-19 ENCOUNTER — Ambulatory Visit (INDEPENDENT_AMBULATORY_CARE_PROVIDER_SITE_OTHER): Payer: Medicare Other | Admitting: Physician Assistant

## 2015-11-19 VITALS — BP 159/83 | HR 53 | Temp 97.0°F | Ht 67.0 in | Wt 176.4 lb

## 2015-11-19 DIAGNOSIS — H60393 Other infective otitis externa, bilateral: Secondary | ICD-10-CM | POA: Diagnosis not present

## 2015-11-19 MED ORDER — NEOMYCIN-POLYMYXIN-HC 3.5-10000-1 OT SOLN: 3.0000 [drp] | Freq: Four times a day (QID) | OTIC | Status: DC

## 2015-11-19 NOTE — Progress Notes (Signed)
Subjective:     Patient ID: Jimmy Park, male   DOB: 09/22/1950, 65 y.o.   MRN: [id number]  HPI Pt with bilat ear pain and PND He has a hx of cerumen impaction and his wife has been trying to clean his ears with OTC med  Review of Systems  Constitutional: Negative.   HENT: Positive for ear pain, postnasal drip and sinus pressure. Negative for ear discharge, rhinorrhea, sneezing, tinnitus and voice change.   Respiratory: Negative.   Cardiovascular: Negative.        Objective:   Physical Exam  HENT:  Right Ear: External ear normal.  Left Ear: External ear normal.  Mouth/Throat: Oropharynx is clear and moist. No oropharyngeal exudate.  + edema and erythem to canals bilat L>R Cerumen in the L canal unable to see the TM L TM with nl landmarks  Neck: Neck supple.  Cardiovascular: Normal rate, regular rhythm and normal heart sounds.   Pulmonary/Chest: Effort normal and breath sounds normal.  Lymphadenopathy:    He has no cervical adenopathy.  Nursing note and vitals reviewed.      Assessment:     1. Otitis, externa, infective, bilateral        Plan:     Hold on wax softener at this time Cortisporin Otic drops OTC Benadryl at night continue Allergra in the am Fluids Rest F/U prn

## 2015-11-19 NOTE — Patient Instructions (Signed)

## 2015-11-27 ENCOUNTER — Other Ambulatory Visit: Payer: Self-pay | Admitting: Family Medicine

## 2016-01-13 ENCOUNTER — Other Ambulatory Visit: Payer: Self-pay | Admitting: Family Medicine

## 2016-01-13 NOTE — Telephone Encounter (Signed)
done

## 2016-01-27 ENCOUNTER — Ambulatory Visit (INDEPENDENT_AMBULATORY_CARE_PROVIDER_SITE_OTHER): Payer: Medicare Other | Admitting: Otolaryngology

## 2016-01-27 DIAGNOSIS — H903 Sensorineural hearing loss, bilateral: Secondary | ICD-10-CM

## 2016-01-27 DIAGNOSIS — H608X3 Other otitis externa, bilateral: Secondary | ICD-10-CM

## 2016-01-29 ENCOUNTER — Encounter: Payer: Medicare Other | Admitting: Family Medicine

## 2016-02-03 ENCOUNTER — Ambulatory Visit (INDEPENDENT_AMBULATORY_CARE_PROVIDER_SITE_OTHER): Payer: Medicare Other | Admitting: Family Medicine

## 2016-02-03 ENCOUNTER — Encounter: Payer: Self-pay | Admitting: Family Medicine

## 2016-02-03 ENCOUNTER — Other Ambulatory Visit: Payer: Self-pay | Admitting: Family Medicine

## 2016-02-03 ENCOUNTER — Ambulatory Visit (INDEPENDENT_AMBULATORY_CARE_PROVIDER_SITE_OTHER): Payer: Medicare Other

## 2016-02-03 VITALS — BP 132/78 | HR 60 | Temp 96.8°F | Ht 67.0 in | Wt 175.8 lb

## 2016-02-03 DIAGNOSIS — J4520 Mild intermittent asthma, uncomplicated: Secondary | ICD-10-CM

## 2016-02-03 DIAGNOSIS — G9389 Other specified disorders of brain: Secondary | ICD-10-CM

## 2016-02-03 DIAGNOSIS — Z0000 Encounter for general adult medical examination without abnormal findings: Secondary | ICD-10-CM

## 2016-02-03 DIAGNOSIS — I421 Obstructive hypertrophic cardiomyopathy: Secondary | ICD-10-CM | POA: Diagnosis not present

## 2016-02-03 DIAGNOSIS — E538 Deficiency of other specified B group vitamins: Secondary | ICD-10-CM

## 2016-02-03 DIAGNOSIS — Z125 Encounter for screening for malignant neoplasm of prostate: Secondary | ICD-10-CM

## 2016-02-03 DIAGNOSIS — I10 Essential (primary) hypertension: Secondary | ICD-10-CM

## 2016-02-03 LAB — URINALYSIS
Bilirubin, UA: NEGATIVE
Glucose, UA: NEGATIVE
Ketones, UA: NEGATIVE
Leukocytes, UA: NEGATIVE
Nitrite, UA: NEGATIVE
Protein, UA: NEGATIVE
RBC, UA: NEGATIVE
Specific Gravity, UA: 1.015 (ref 1.005–1.030)
Urobilinogen, Ur: 0.2 mg/dL (ref 0.2–1.0)
pH, UA: 6.5 (ref 5.0–7.5)

## 2016-02-03 NOTE — Progress Notes (Signed)
Your chest x-ray looked normal. Thanks, WS.

## 2016-02-03 NOTE — Progress Notes (Signed)
Subjective:  Patient ID: Jimmy Park, male    DOB: January 17, 1951  Age: 65 y.o. MRN: [id number]  CC: Annual Exam   HPI [name] presents for Welcome to Medicare     Current Medications (verified) Outpatient Encounter Prescriptions as of 02/03/2016  Medication Sig  . albuterol (PROVENTIL HFA;VENTOLIN HFA) 108 (90 Base) MCG/ACT inhaler Inhale 1 puff into the lungs every 4 (four) hours as needed.  [name] aspirin 81 MG tablet Take 1 tablet by mouth daily.  . [facility] ALLERGY RELIEF 180 MG tablet TAKE 1 TABLET (180 MG TOTAL) BY MOUTH DAILY.  . cyanocobalamin (,VITAMIN B-12,) 1000 MCG/ML injection INJECT 1 ML (1,000 MCG TOTAL) INTO THE MUSCLE EVERY 30 (THIRTY) DAYS.  [name] DHA-EPA-VITAMIN E PO Take 1 tablet by mouth daily.  [name] Sodium 100 MG capsule Take 1 tablet by mouth at bedtime.  . folic acid (FOLVITE) 1 MG tablet Take 1 tablet by mouth daily.  [name] ibuprofen (ADVIL,MOTRIN) 400 MG tablet Take 1 tablet by mouth 2 (two) times daily as needed.  [name] lisinopril (PRINIVIL,ZESTRIL) 20 MG tablet TAKE 1 TABLET (20 MG TOTAL) BY MOUTH DAILY.  . metoprolol tartrate (LOPRESSOR) 25 MG tablet TAKE 1 TABLET (25 MG TOTAL) BY MOUTH DAILY.  . montelukast (SINGULAIR) 10 MG tablet TAKE 1 TABLET (10 MG TOTAL) BY MOUTH DAILY.  [name] omeprazole (PRILOSEC) 40 MG capsule Take 40 mg by mouth daily.   [name] neomycin-polymyxin-hydrocortisone (CORTISPORIN) otic solution Place 3 drops into both ears 4 (four) times daily. (Patient not taking: Reported on 02/03/2016)   No facility-administered encounter medications on file as of 02/03/2016.     Allergies (verified) Review of patient's allergies indicates no known allergies.   History: Past Medical History:  Diagnosis Date  . Allergy   . Anemia   . Asthma   . B12 deficiency   . Cerebral ventriculomegaly   . Gait abnormality   . Hypertension    Past Surgical History:  Procedure Laterality Date  . LUMBAR DRAIN IMPLANTATION     Family History  Problem Relation Age of Onset    . Diabetes Mother   . Hypertension Mother   . Diabetes Father   . Hypertension Father   . Asthma Brother    Social History   Occupational History  . Not on file.   Social History Main Topics  . Smoking status: Former Smoker    Packs/day: 0.50    Types: Cigarettes    Start date: 06/09/1967    Quit date: 06/08/1973  . Smokeless tobacco: Never Used  . Alcohol use 0.0 oz/week     Comment: rare  . Drug use: No  . Sexual activity: Not on file    Do you feel safe at home?  Yes Are there smokers in your home (other than you)? No  Dietary issues and exercise activities discussed: Current Exercise Habits: Home exercise routine, Type of exercise: walking, Frequency (Times/Week): 5, Intensity: Mild  Current Dietary habits:  Regular diet. No excesses of carb, fat, detected as discussed       Objective:    Today's Vitals   02/03/16 0909  BP: 132/78  Pulse: 60  Temp: (!) 96.8 F (36 C)  TempSrc: Oral  SpO2: 97%  Weight: 175 lb 12.8 oz (79.7 kg)  Height: '5\' 7"'$  (1.702 m)   Body mass index is 27.53 kg/m.   Activities of Daily Living In your present state of health, do you have any difficulty performing the following activities: 02/03/2016  Hearing? [name]  Vision? N  Difficulty concentrating or making decisions? N  Walking or climbing stairs? Y  Dressing or bathing? N  Doing errands, shopping? Y  Some recent data might be hidden     Depression Screen PHQ 2/9 Scores 02/03/2016 11/19/2015 11/08/2014 09/07/2014  PHQ - 2 Score 0 0 0 0     Fall Risk Fall Risk  02/03/2016 11/19/2015 11/08/2014 09/07/2014  Falls in the past year? No No No No  Risk for fall due to : - Impaired balance/gait - Impaired balance/gait    Cognitive Function: Nml conversant Immunizations and Health Maintenance Immunization History  Administered Date(s) Administered  . Influenza,inj,Quad PF,36+ Mos 03/27/2015   Health Maintenance Due  Topic Date Due  . Hepatitis C Screening  1950-12-14  . HIV Screening   04/08/1965  . ZOSTAVAX  04/08/2010  . PNA vac Low Risk Adult (1 of 2 - PCV13) 04/09/2015  . INFLUENZA VACCINE  01/07/2016    Patient Care Team: Claretta Fraise, MD as PCP - General (Family Medicine) [facility] Colonoscopy - two years ago in [place]    Assessment:    Annual Wellness Visit    Screening Tests Health Maintenance  Topic Date Due  . Hepatitis C Screening  Feb 23, 1951  . HIV Screening  04/08/1965  . ZOSTAVAX  04/08/2010  . PNA vac Low Risk Adult (1 of 2 - PCV13) 04/09/2015  . INFLUENZA VACCINE  01/07/2016  . TETANUS/TDAP  09/17/2017  . COLONOSCOPY  05/08/2024        Plan:   During the course of the visit [name] was educated and counseled about the following appropriate screening and preventive services:   Vaccines to include Pneumoccal, Influenza,  Td, Zostavax,  Colorectal cancer screening  Cardiovascular disease screening  Diabetes screening  Bone Denisty / Osteoporosis Screening  Glaucoma screening / Diabetic Eye Exam  Nutrition counseling  Prostate cancer screening  Smoking cessation counseling  Advanced Directives  Physical Activity   Goals    None       Patient Instructions (the written plan) were given to the patient.   Onyx Edgley, MD   02/03/2016         History Jimmy Park has a past medical history of Allergy; Anemia; Asthma; B12 deficiency; Cerebral ventriculomegaly; Gait abnormality; and Hypertension.   He has a past surgical history that includes Lumbar drain implantation.   His family history includes Asthma in his brother; Diabetes in his father and mother; Hypertension in his father and mother.He reports that he quit smoking about 42 years ago. His smoking use included Cigarettes. He started smoking about 48 years ago. He smoked 0.50 packs per day. He has never used smokeless tobacco. He reports that he drinks alcohol. He reports that he does not use drugs.    ROS Review of Systems  Constitutional:  Negative for activity change, appetite change, chills, diaphoresis, fatigue, fever and unexpected weight change.  HENT: Positive for ear pain (frequent infections) and hearing loss (severe. Working on hearing aides - not fitting properly). Negative for congestion, postnasal drip, rhinorrhea, sore throat, tinnitus and trouble swallowing.   Eyes: Negative for photophobia, pain, discharge and redness.  Respiratory: Negative for apnea, cough, choking, chest tightness, shortness of breath, wheezing and stridor.   Cardiovascular: Negative for chest pain, palpitations and leg swelling.  Gastrointestinal: Negative for abdominal distention, abdominal pain, blood in stool, constipation, diarrhea, nausea and vomiting.  Endocrine: Negative for cold intolerance, heat intolerance, polydipsia, polyphagia and polyuria.  Genitourinary: Negative for difficulty urinating,  dysuria, enuresis, flank pain, frequency, genital sores, hematuria and urgency.  Musculoskeletal: Negative for arthralgias and joint swelling.  Skin: Negative for color change, rash and wound.  Allergic/Immunologic: Negative for immunocompromised state.  Neurological: Positive for headaches (over left brow. Better after shunt, now mild, but regular.). Negative for dizziness, tremors, seizures, syncope, facial asymmetry, speech difficulty, weakness, light-headedness and numbness.  Hematological: Does not bruise/bleed easily.  Psychiatric/Behavioral: Negative for agitation, behavioral problems, confusion, decreased concentration, dysphoric mood, hallucinations, sleep disturbance and suicidal ideas. The patient is not nervous/anxious and is not hyperactive.     Objective:  BP 132/78 (BP Location: Left Arm, Patient Position: Sitting, Cuff Size: Normal)   Pulse 60   Temp (!) 96.8 F (36 C) (Oral)   Ht '5\' 7"'$  (1.702 m)   Wt 175 lb 12.8 oz (79.7 kg)   SpO2 97%   BMI 27.53 kg/m   BP Readings from Last 3 Encounters:  02/03/16 132/78  11/19/15 (!)  159/83  06/11/15 123/74    Wt Readings from Last 3 Encounters:  02/03/16 175 lb 12.8 oz (79.7 kg)  11/19/15 176 lb 6.4 oz (80 kg)  06/11/15 176 lb (79.8 kg)     Physical Exam  Constitutional: He is oriented to person, place, and time. He appears well-developed and well-nourished.  HENT:  Head: Normocephalic and atraumatic.  Mouth/Throat: Oropharynx is clear and moist.  Eyes: EOM are normal. Pupils are equal, round, and reactive to light.  Neck: Normal range of motion. No tracheal deviation present. No thyromegaly present.  Cardiovascular: Normal rate, regular rhythm and normal heart sounds.  Exam reveals no gallop and no friction rub.   No murmur heard. Pulmonary/Chest: Breath sounds normal. He has no wheezes. He has no rales.  Abdominal: Soft. He exhibits no mass. There is no tenderness.  Musculoskeletal: Normal range of motion. He exhibits no edema.  Neurological: He is alert and oriented to person, place, and time.  Skin: Skin is warm and dry.  Psychiatric: He has a normal mood and affect.     Lab Results  Component Value Date   WBC 6.1 09/07/2014   HGB 14.4 09/07/2014   HCT 41.7 09/07/2014   PLT 225 09/07/2014   GLUCOSE 89 09/07/2014   CHOL 168 09/07/2014   TRIG 110 09/07/2014   HDL 39 (L) 09/07/2014   ALT 30 09/07/2014   AST 24 09/07/2014   NA 142 09/07/2014   K 4.3 09/07/2014   CL 99 09/07/2014   CREATININE 0.88 09/07/2014   BUN 20 09/07/2014   CO2 23 09/07/2014   TSH 2.980 09/07/2014   PSA 0.3 09/07/2014    Patient was never admitted.  Assessment & Plan:   Dagoberto was seen today for annual exam.  Diagnoses and all orders for this visit:  Medicare annual wellness visit, initial -     US A[place] Initial Medicare Screen; Future -     CBC with Differential/Platelet -     CMP14+EGFR -     Lipid panel -     Urinalysis -     EKG 12-Lead  Asthma, intermittent, uncomplicated -     CBC with Differential/Platelet -     CMP14+EGFR -     Lipid panel -      Cancel: PR BREATHING CAPACITY TEST -     Urinalysis -     DG Chest 2 View; Future -     PR BREATHING CAPACITY TEST  B12 deficiency -     CBC with Differential/Platelet -  CMP14+EGFR -     Lipid panel -     Urinalysis  Benign essential HTN -     CBC with Differential/Platelet -     CMP14+EGFR -     Lipid panel -     Urinalysis  Cardiomyopathy, hypertrophic obstructive (HCC) -     CBC with Differential/Platelet -     CMP14+EGFR -     Lipid panel -     Urinalysis  Cerebral ventriculomegaly -     CBC with Differential/Platelet -     CMP14+EGFR -     Lipid panel -     Urinalysis  Screening for prostate cancer -     PSA Total (Reflex To Free)   Need report of colonoscopy - will get ROI  I am having Mr. Stemen maintain his aspirin, Docusate Sodium, folic acid, DHA-EPA-VITAMIN E PO, ibuprofen, omeprazole, albuterol, [facility] ALLERGY RELIEF, cyanocobalamin, neomycin-polymyxin-hydrocortisone, metoprolol tartrate, lisinopril, and montelukast.  No orders of the defined types were placed in this encounter.    Follow-up: Return in about 6 months (around 08/05/2016).  Claretta Fraise, M.D.

## 2016-02-04 LAB — CMP14+EGFR
ALT: 54 IU/L — ABNORMAL HIGH (ref 0–44)
AST: 35 IU/L (ref 0–40)
Albumin/Globulin Ratio: 1.9 (ref 1.2–2.2)
Albumin: 4.8 g/dL (ref 3.6–4.8)
Alkaline Phosphatase: 74 IU/L (ref 39–117)
BUN/Creatinine Ratio: 20 (ref 10–24)
BUN: 18 mg/dL (ref 8–27)
Bilirubin Total: 0.5 mg/dL (ref 0.0–1.2)
CO2: 25 mmol/L (ref 18–29)
Calcium: 9.2 mg/dL (ref 8.6–10.2)
Chloride: 100 mmol/L (ref 96–106)
Creatinine, Ser: 0.9 mg/dL (ref 0.76–1.27)
GFR calc Af Amer: 103 mL/min/{1.73_m2} (ref >59)
GFR calc non Af Amer: 89 mL/min/{1.73_m2} (ref >59)
Globulin, Total: 2.5 g/dL (ref 1.5–4.5)
Glucose: 105 mg/dL — ABNORMAL HIGH (ref 65–99)
Potassium: 4.7 mmol/L (ref 3.5–5.2)
Sodium: 142 mmol/L (ref 134–144)
Total Protein: 7.3 g/dL (ref 6.0–8.5)

## 2016-02-04 LAB — PSA TOTAL (REFLEX TO FREE): Prostate Specific Ag, Serum: 0.3 ng/mL (ref 0.0–4.0)

## 2016-02-04 LAB — CBC WITH DIFFERENTIAL/PLATELET
Basophils Absolute: 0 10*3/uL (ref 0.0–0.2)
Basos: 0 %
EOS (ABSOLUTE): 0.3 10*3/uL (ref 0.0–0.4)
Eos: 4 %
Hematocrit: 43.7 % (ref 37.5–51.0)
Hemoglobin: 14.8 g/dL (ref 12.6–17.7)
Immature Grans (Abs): 0 10*3/uL (ref 0.0–0.1)
Immature Granulocytes: 0 %
Lymphocytes Absolute: 2.1 10*3/uL (ref 0.7–3.1)
Lymphs: 35 %
MCH: 32.5 pg (ref 26.6–33.0)
MCHC: 33.9 g/dL (ref 31.5–35.7)
MCV: 96 fL (ref 79–97)
Monocytes Absolute: 0.6 10*3/uL (ref 0.1–0.9)
Monocytes: 10 %
Neutrophils Absolute: 3 10*3/uL (ref 1.4–7.0)
Neutrophils: 51 %
Platelets: 221 10*3/uL (ref 150–379)
RBC: 4.56 x10E6/uL (ref 4.14–5.80)
RDW: 12.9 % (ref 12.3–15.4)
WBC: 6.1 10*3/uL (ref 3.4–10.8)

## 2016-02-04 LAB — LIPID PANEL
Chol/HDL Ratio: 4 ratio units (ref 0.0–5.0)
Cholesterol, Total: 171 mg/dL (ref 100–199)
HDL: 43 mg/dL (ref >39)
LDL Calculated: 110 mg/dL — ABNORMAL HIGH (ref 0–99)
Triglycerides: 88 mg/dL (ref 0–149)
VLDL Cholesterol Cal: 18 mg/dL (ref 5–40)

## 2016-02-18 ENCOUNTER — Ambulatory Visit (HOSPITAL_COMMUNITY)
Admission: RE | Admit: 2016-02-18 | Discharge: 2016-02-18 | Disposition: A | Discharge status: Home / Self Care | Payer: Medicare Other | Source: Ambulatory Visit | Attending: Family Medicine | Admitting: Family Medicine

## 2016-02-18 DIAGNOSIS — Z136 Encounter for screening for cardiovascular disorders: Secondary | ICD-10-CM | POA: Insufficient documentation

## 2016-02-18 DIAGNOSIS — Z0000 Encounter for general adult medical examination without abnormal findings: Secondary | ICD-10-CM | POA: Insufficient documentation

## 2016-02-24 ENCOUNTER — Other Ambulatory Visit: Payer: Self-pay | Admitting: Family Medicine

## 2016-02-26 ENCOUNTER — Ambulatory Visit (INDEPENDENT_AMBULATORY_CARE_PROVIDER_SITE_OTHER): Payer: Medicare Other

## 2016-02-26 DIAGNOSIS — Z23 Encounter for immunization: Secondary | ICD-10-CM | POA: Diagnosis not present

## 2016-05-28 ENCOUNTER — Other Ambulatory Visit: Payer: Self-pay | Admitting: Family Medicine

## 2016-06-02 ENCOUNTER — Other Ambulatory Visit: Payer: Self-pay

## 2016-06-02 MED ORDER — FEXOFENADINE HCL 180 MG PO TABS: ORAL_TABLET | ORAL | 0 refills | Status: DC

## 2016-06-30 DIAGNOSIS — R69 Illness, unspecified: Secondary | ICD-10-CM | POA: Diagnosis not present

## 2016-07-01 ENCOUNTER — Other Ambulatory Visit: Payer: Self-pay

## 2016-07-01 MED ORDER — FEXOFENADINE HCL 180 MG PO TABS: ORAL_TABLET | ORAL | 1 refills | Status: DC

## 2016-07-07 ENCOUNTER — Other Ambulatory Visit: Payer: Self-pay | Admitting: Family Medicine

## 2016-08-19 ENCOUNTER — Other Ambulatory Visit: Payer: Self-pay | Admitting: Family Medicine

## 2016-08-19 DIAGNOSIS — H5203 Hypermetropia, bilateral: Secondary | ICD-10-CM | POA: Diagnosis not present

## 2016-08-19 DIAGNOSIS — H5213 Myopia, bilateral: Secondary | ICD-10-CM | POA: Diagnosis not present

## 2016-08-19 DIAGNOSIS — H52229 Regular astigmatism, unspecified eye: Secondary | ICD-10-CM | POA: Diagnosis not present

## 2016-08-19 DIAGNOSIS — H524 Presbyopia: Secondary | ICD-10-CM | POA: Diagnosis not present

## 2016-09-24 ENCOUNTER — Other Ambulatory Visit: Payer: Self-pay | Admitting: Family Medicine

## 2016-10-22 ENCOUNTER — Other Ambulatory Visit: Payer: Self-pay | Admitting: Family Medicine

## 2016-10-22 MED ORDER — ALBUTEROL SULFATE HFA 108 (90 BASE) MCG/ACT IN AERS: 1.0000 | INHALATION_SPRAY | RESPIRATORY_TRACT | 0 refills | Status: DC | PRN

## 2016-10-22 NOTE — Telephone Encounter (Signed)
Authorize 30 days only. Then contact the patient letting them know that they will need an appointment before any further prescriptions can be sent in. 

## 2016-11-16 ENCOUNTER — Other Ambulatory Visit: Payer: Self-pay | Admitting: Family Medicine

## 2016-11-21 ENCOUNTER — Other Ambulatory Visit: Payer: Self-pay | Admitting: Family Medicine

## 2016-11-23 NOTE — Telephone Encounter (Signed)
last vit B level was in 2016

## 2016-12-17 ENCOUNTER — Other Ambulatory Visit: Payer: Self-pay | Admitting: Family Medicine

## 2016-12-18 ENCOUNTER — Telehealth: Payer: Self-pay | Admitting: Family Medicine

## 2016-12-18 MED ORDER — ALBUTEROL SULFATE HFA 108 (90 BASE) MCG/ACT IN AERS: 1.0000 | INHALATION_SPRAY | RESPIRATORY_TRACT | 0 refills | Status: DC | PRN

## 2016-12-18 NOTE — Telephone Encounter (Signed)
Stacks pt, last seen 01/2016

## 2016-12-18 NOTE — Telephone Encounter (Signed)
What is the name of the medication? Pro-Air Albuterol  Have you contacted your pharmacy to request a refill? YES  Which [facility] [place]   Patient notified that their request is being sent to the clinical staff for review and that they should receive a call once it is complete. If they do not receive a call within 24 hours they can check with their pharmacy or our office.

## 2016-12-18 NOTE — Telephone Encounter (Signed)
Last seen 02/03/16  Dr Livia Snellen

## 2016-12-22 ENCOUNTER — Other Ambulatory Visit: Payer: Self-pay | Admitting: Family Medicine

## 2016-12-30 DIAGNOSIS — L570 Actinic keratosis: Secondary | ICD-10-CM | POA: Diagnosis not present

## 2016-12-30 DIAGNOSIS — Z85828 Personal history of other malignant neoplasm of skin: Secondary | ICD-10-CM | POA: Diagnosis not present

## 2017-01-02 ENCOUNTER — Other Ambulatory Visit: Payer: Self-pay | Admitting: Family Medicine

## 2017-01-07 DIAGNOSIS — R69 Illness, unspecified: Secondary | ICD-10-CM | POA: Diagnosis not present

## 2017-01-20 ENCOUNTER — Other Ambulatory Visit: Payer: Self-pay | Admitting: Family Medicine

## 2017-01-20 NOTE — Telephone Encounter (Signed)
Last seen 02/03/16  Dr Livia Snellen

## 2017-01-25 ENCOUNTER — Other Ambulatory Visit: Payer: Self-pay | Admitting: Family Medicine

## 2017-02-03 ENCOUNTER — Encounter: Payer: Self-pay | Admitting: Family Medicine

## 2017-02-03 ENCOUNTER — Ambulatory Visit (INDEPENDENT_AMBULATORY_CARE_PROVIDER_SITE_OTHER): Payer: Medicare HMO | Admitting: Family Medicine

## 2017-02-03 VITALS — BP 120/75 | HR 62 | Temp 97.0°F | Ht 67.0 in | Wt 177.0 lb

## 2017-02-03 DIAGNOSIS — J4520 Mild intermittent asthma, uncomplicated: Secondary | ICD-10-CM | POA: Diagnosis not present

## 2017-02-03 DIAGNOSIS — I10 Essential (primary) hypertension: Secondary | ICD-10-CM

## 2017-02-03 DIAGNOSIS — E538 Deficiency of other specified B group vitamins: Secondary | ICD-10-CM

## 2017-02-03 DIAGNOSIS — R269 Unspecified abnormalities of gait and mobility: Secondary | ICD-10-CM

## 2017-02-03 DIAGNOSIS — R221 Localized swelling, mass and lump, neck: Secondary | ICD-10-CM

## 2017-02-03 DIAGNOSIS — Z125 Encounter for screening for malignant neoplasm of prostate: Secondary | ICD-10-CM

## 2017-02-03 DIAGNOSIS — Z0000 Encounter for general adult medical examination without abnormal findings: Secondary | ICD-10-CM

## 2017-02-03 DIAGNOSIS — I421 Obstructive hypertrophic cardiomyopathy: Secondary | ICD-10-CM

## 2017-02-03 DIAGNOSIS — R0989 Other specified symptoms and signs involving the circulatory and respiratory systems: Secondary | ICD-10-CM | POA: Diagnosis not present

## 2017-02-03 DIAGNOSIS — G9389 Other specified disorders of brain: Secondary | ICD-10-CM

## 2017-02-03 LAB — URINALYSIS
Bilirubin, UA: NEGATIVE
Glucose, UA: NEGATIVE
Ketones, UA: NEGATIVE
Leukocytes, UA: NEGATIVE
Nitrite, UA: NEGATIVE
Protein, UA: NEGATIVE
RBC, UA: NEGATIVE
Specific Gravity, UA: 1.025 (ref 1.005–1.030)
Urobilinogen, Ur: 0.2 mg/dL (ref 0.2–1.0)
pH, UA: 5.5 (ref 5.0–7.5)

## 2017-02-03 MED ORDER — MONTELUKAST SODIUM 10 MG PO TABS: 10.0000 mg | ORAL_TABLET | Freq: Every day | ORAL | 1 refills | Status: DC

## 2017-02-03 MED ORDER — METOPROLOL TARTRATE 25 MG PO TABS: ORAL_TABLET | ORAL | 1 refills | Status: DC

## 2017-02-03 MED ORDER — NEOMYCIN-POLYMYXIN-HC 3.5-10000-1 OT SOLN: 3.0000 [drp] | Freq: Four times a day (QID) | OTIC | 0 refills | Status: DC

## 2017-02-03 MED ORDER — LISINOPRIL 20 MG PO TABS: ORAL_TABLET | ORAL | 1 refills | Status: DC

## 2017-02-03 MED ORDER — OMEPRAZOLE 40 MG PO CPDR: 40.0000 mg | DELAYED_RELEASE_CAPSULE | Freq: Every day | ORAL | 1 refills | Status: DC

## 2017-02-03 NOTE — Progress Notes (Addendum)
Subjective:  Patient ID: Jimmy Park, male    DOB: Sep 28, 1950  Age: 66 y.o. MRN: [id number]  CC: Annual Exam (pt here today for CPE and also c/o cough, ear ache and sore throat.)   HPI Jimmy Park presents for Patient complaining of cough for the last 3 months. Testicular tachycardia annoying cough. He is an asthma patient but his most recent PFT showed normal values one year ago. That will be repeated today. Additionally he continues to use his inhaler and his Singulair regularly area of note is that he does use an ACE inhibitor for his blood pressure.   Also having some left ear pain. He bought hearing aids for about 4 $5000 and can't wear them because they hurt his ears. He's been working on this for about a year. Most recently he was given some cream to put in the left ear that doesn't seem to be helping. The years having some pain whether he uses the hearing aid or not.  Patient has a history of hydrocephalus and cerebral ventriculomegaly and has had a procedure done at [facility] after referral by this office. His wife and he both say that he is walking much better. He has had no falls for several months. He does use a cane for safety but hasn't had to rely on it even though he uses it. The one exception is that late in the day when he is tired his wife notices he'll drag the left leg just a little bit.  Depression screen [facility] 2/9 02/03/2017 02/03/2016 11/19/2015  Decreased Interest 0 0 0  Down, Depressed, Hopeless 0 0 0  PHQ - 2 Score 0 0 0    History Jimmy Park has a past medical history of Allergy; Anemia; Asthma; B12 deficiency; Cerebral ventriculomegaly; Gait abnormality; and Hypertension.   He has a past surgical history that includes Lumbar drain implantation.   His family history includes Asthma in his brother; Diabetes in his father and mother; Hypertension in his father and mother.He reports that he quit smoking about 43 years ago. His smoking use included Cigarettes. He started smoking  about 49 years ago. He smoked 0.50 packs per day. He has never used smokeless tobacco. He reports that he drinks alcohol. He reports that he does not use drugs.    ROS Review of Systems  Constitutional: Negative for activity change, appetite change, chills, diaphoresis, fatigue, fever and unexpected weight change.  HENT: Positive for ear pain. Negative for congestion, hearing loss, postnasal drip, rhinorrhea, sore throat, tinnitus and trouble swallowing.   Eyes: Negative for photophobia, pain, discharge, redness and visual disturbance.  Respiratory: Positive for cough. Negative for apnea, choking, chest tightness, shortness of breath, wheezing and stridor.   Cardiovascular: Negative for chest pain, palpitations and leg swelling.  Gastrointestinal: Negative for abdominal distention, abdominal pain, blood in stool, constipation, diarrhea, nausea and vomiting.  Endocrine: Negative for cold intolerance, heat intolerance, polydipsia, polyphagia and polyuria.  Genitourinary: Negative for difficulty urinating, dysuria, enuresis, flank pain, frequency, genital sores, hematuria and urgency.  Musculoskeletal: Positive for gait problem (ee history of present illness). Negative for arthralgias and joint swelling.  Skin: Negative for color change, rash and wound.  Allergic/Immunologic: Negative for immunocompromised state.  Neurological: Negative for dizziness, tremors, seizures, syncope, facial asymmetry, speech difficulty, weakness, light-headedness, numbness and headaches.  Hematological: Does not bruise/bleed easily.  Psychiatric/Behavioral: Negative for agitation, behavioral problems, confusion, decreased concentration, dysphoric mood, hallucinations, sleep disturbance and suicidal ideas. The patient is not nervous/anxious and is not hyperactive.  Objective:  BP 120/75   Pulse 62   Temp (!) 97 F (36.1 C) (Oral)   Ht '5\' 7"'$  (1.702 m)   Wt 177 lb (80.3 kg)   BMI 27.72 kg/m   BP Readings from  Last 3 Encounters:  02/03/17 120/75  02/03/16 132/78  11/19/15 (!) 159/83    Wt Readings from Last 3 Encounters:  02/03/17 177 lb (80.3 kg)  02/03/16 175 lb 12.8 oz (79.7 kg)  11/19/15 176 lb 6.4 oz (80 kg)     Physical Exam  Constitutional: He is oriented to person, place, and time. He appears well-developed and well-nourished.  HENT:  Head: Normocephalic and atraumatic.  Mouth/Throat: Oropharynx is clear and moist.  Eyes: Pupils are equal, round, and reactive to light. EOM are normal.  Neck: Phonation normal. No muscular tenderness (there is fullnessIn the left upper anterior neck just lateral to the carotid bifurcation region. Some increased firmness reminiscent of a mass but not clearly defined.) present. Carotid bruit is present (bilaterally). Decreased range of motion present. No tracheal deviation and no edema present. No Brudzinski's sign and no Kernig's sign noted. No thyromegaly present.  INcreased firmness with ill defined mass of 2 cm near left carotid bifurcation.   Cardiovascular: Normal rate, regular rhythm and normal heart sounds.  Exam reveals no gallop and no friction rub.   No murmur heard. Pulmonary/Chest: Breath sounds normal. He has no wheezes. He has no rales.  Abdominal: Soft. He exhibits no mass. There is no tenderness.  Musculoskeletal: He exhibits no edema.  Left leg has small amount of muscle atrophy compared to right   Neurological: He is alert and oriented to person, place, and time.  Skin: Skin is warm and dry.  Psychiatric: He has a normal mood and affect.      Assessment & Plan:   Tandre was seen today for annual exam.  Diagnoses and all orders for this visit:  Cardiomyopathy, hypertrophic obstructive ([place]) -     US SOFT TISSUE NECK; Future  Benign essential HTN -     CBC with Differential/Platelet -     CMP14+EGFR -     Lipid panel -     VITAMIN D 25 Hydroxy (Vit-D Deficiency, Fractures) -     Urinalysis -     lisinopril  (PRINIVIL,ZESTRIL) 20 MG tablet; TAKE 1 TABLET (20 MG TOTAL) BY MOUTH DAILY. -     metoprolol tartrate (LOPRESSOR) 25 MG tablet; TAKE 1 TABLET (25 MG TOTAL) BY MOUTH DAILY.  Screening for prostate cancer -     PSA, total and free  Mild intermittent asthma without complication -     montelukast (SINGULAIR) 10 MG tablet; Take 1 tablet (10 mg total) by mouth daily. -     PR BREATHING CAPACITY TEST  B12 deficiency  Cerebral ventriculomegaly  Abnormal gait  Well adult exam  Bilateral carotid bruits -     US Carotid Duplex Bilateral; Future  Mass of left side of neck -     US SOFT TISSUE NECK; Future  Other orders -     omeprazole (PRILOSEC) 40 MG capsule; Take 1 capsule (40 mg total) by mouth daily. -     neomycin-polymyxin-hydrocortisone (CORTISPORIN) OTIC solution; Place 3 drops into both ears 4 (four) times daily.       I have changed Mr. Silva omeprazole. I am also having him maintain his aspirin, Docusate Sodium, folic acid, DHA-EPA-VITAMIN E PO, ibuprofen, cyanocobalamin, fexofenadine, albuterol, cyanocobalamin, lisinopril, metoprolol tartrate, montelukast, and neomycin-polymyxin-hydrocortisone.  Allergies as of 02/03/2017   No Known Allergies     Medication List       Accurate as of 02/03/17 12:01 PM. Always use your most recent med list.          albuterol 108 (90 Base) MCG/ACT inhaler Commonly known as:  PROVENTIL HFA;VENTOLIN HFA Inhale 1 puff into the lungs every 4 (four) hours as needed.   aspirin 81 MG tablet Take 1 tablet by mouth daily.   cyanocobalamin 1000 MCG/ML injection Commonly known as:  (VITAMIN B-12) INJECT 1 ML (1,000 MCG TOTAL) INTO THE MUSCLE EVERY 30 (THIRTY) DAYS.   cyanocobalamin 1000 MCG/ML injection Commonly known as:  (VITAMIN B-12) INJECT 1 ML (1,000 MCG TOTAL) INTO THE MUSCLE EVERY 30 (THIRTY) DAYS.   DHA-EPA-VITAMIN E PO Take 1 tablet by mouth daily.   Docusate Sodium 100 MG capsule Take 1 tablet by mouth at bedtime.     fexofenadine 180 MG tablet Commonly known as:  ALLEGRA TAKE 1 TABLET (180 MG TOTAL) BY MOUTH DAILY.   folic acid 1 MG tablet Commonly known as:  FOLVITE Take 1 tablet by mouth daily.   ibuprofen 400 MG tablet Commonly known as:  ADVIL,MOTRIN Take 1 tablet by mouth 2 (two) times daily as needed.   lisinopril 20 MG tablet Commonly known as:  PRINIVIL,ZESTRIL TAKE 1 TABLET (20 MG TOTAL) BY MOUTH DAILY.   metoprolol tartrate 25 MG tablet Commonly known as:  LOPRESSOR TAKE 1 TABLET (25 MG TOTAL) BY MOUTH DAILY.   montelukast 10 MG tablet Commonly known as:  SINGULAIR Take 1 tablet (10 mg total) by mouth daily.   neomycin-polymyxin-hydrocortisone OTIC solution Commonly known as:  CORTISPORIN Place 3 drops into both ears 4 (four) times daily.   omeprazole 40 MG capsule Commonly known as:  PRILOSEC Take 1 capsule (40 mg total) by mouth daily.            Discharge Care Instructions        Start     Ordered   02/03/17 0000  CBC with Differential/Platelet     02/03/17 0948   02/03/17 0000  CMP14+EGFR     02/03/17 0948   02/03/17 0000  Lipid panel     02/03/17 0948   02/03/17 0000  PSA, total and free     02/03/17 0948   02/03/17 0000  VITAMIN D 25 Hydroxy (Vit-D Deficiency, Fractures)     02/03/17 0948   02/03/17 0000  Urinalysis     02/03/17 0948   02/03/17 0000  US Carotid Duplex Bilateral    Question Answer Comment  Reason for exam: bruit   Preferred imaging location? [facility]      02/03/17 0932   02/03/17 0000  US SOFT TISSUE NECK    Question Answer Comment  Reason for Exam (SYMPTOM  OR DIAGNOSIS REQUIRED) bruit   Preferred imaging location? [facility]      02/03/17 0948   02/03/17 0000  lisinopril (PRINIVIL,ZESTRIL) 20 MG tablet     02/03/17 0952   02/03/17 0000  metoprolol tartrate (LOPRESSOR) 25 MG tablet     02/03/17 0952   02/03/17 0000  montelukast (SINGULAIR) 10 MG tablet  Daily     02/03/17 0952   02/03/17 0000  omeprazole  (PRILOSEC) 40 MG capsule  Daily     02/03/17 0952   02/03/17 0000  neomycin-polymyxin-hydrocortisone (CORTISPORIN) OTIC solution  4 times daily     02/03/17 0952   02/03/17 0000  PR BREATHING  CAPACITY TEST     02/03/17 0955       Follow-up: Return in about 6 months (around 08/05/2017), or if symptoms worsen or fail to improve.  Claretta Fraise, M.D.

## 2017-02-04 LAB — CBC WITH DIFFERENTIAL/PLATELET
Basophils Absolute: 0 10*3/uL (ref 0.0–0.2)
Basos: 1 %
EOS (ABSOLUTE): 0.2 10*3/uL (ref 0.0–0.4)
Eos: 4 %
Hematocrit: 43 % (ref 37.5–51.0)
Hemoglobin: 15.2 g/dL (ref 13.0–17.7)
Immature Grans (Abs): 0 10*3/uL (ref 0.0–0.1)
Immature Granulocytes: 0 %
Lymphocytes Absolute: 1.8 10*3/uL (ref 0.7–3.1)
Lymphs: 34 %
MCH: 32.5 pg (ref 26.6–33.0)
MCHC: 35.3 g/dL (ref 31.5–35.7)
MCV: 92 fL (ref 79–97)
Monocytes Absolute: 0.4 10*3/uL (ref 0.1–0.9)
Monocytes: 7 %
Neutrophils Absolute: 3.1 10*3/uL (ref 1.4–7.0)
Neutrophils: 54 %
Platelets: 217 10*3/uL (ref 150–379)
RBC: 4.67 x10E6/uL (ref 4.14–5.80)
RDW: 13 % (ref 12.3–15.4)
WBC: 5.5 10*3/uL (ref 3.4–10.8)

## 2017-02-04 LAB — CMP14+EGFR
ALT: 46 IU/L — ABNORMAL HIGH (ref 0–44)
AST: 34 IU/L (ref 0–40)
Albumin/Globulin Ratio: 2 (ref 1.2–2.2)
Albumin: 4.7 g/dL (ref 3.6–4.8)
Alkaline Phosphatase: 72 IU/L (ref 39–117)
BUN/Creatinine Ratio: 19 (ref 10–24)
BUN: 18 mg/dL (ref 8–27)
Bilirubin Total: 0.5 mg/dL (ref 0.0–1.2)
CO2: 24 mmol/L (ref 20–29)
Calcium: 9.5 mg/dL (ref 8.6–10.2)
Chloride: 102 mmol/L (ref 96–106)
Creatinine, Ser: 0.94 mg/dL (ref 0.76–1.27)
GFR calc Af Amer: 97 mL/min/{1.73_m2} (ref >59)
GFR calc non Af Amer: 84 mL/min/{1.73_m2} (ref >59)
Globulin, Total: 2.4 g/dL (ref 1.5–4.5)
Glucose: 124 mg/dL — ABNORMAL HIGH (ref 65–99)
Potassium: 4.5 mmol/L (ref 3.5–5.2)
Sodium: 142 mmol/L (ref 134–144)
Total Protein: 7.1 g/dL (ref 6.0–8.5)

## 2017-02-04 LAB — PSA, TOTAL AND FREE
PSA, Free Pct: 33.3 %
PSA, Free: 0.1 ng/mL
Prostate Specific Ag, Serum: 0.3 ng/mL (ref 0.0–4.0)

## 2017-02-04 LAB — LIPID PANEL
Chol/HDL Ratio: 3.9 ratio (ref 0.0–5.0)
Cholesterol, Total: 168 mg/dL (ref 100–199)
HDL: 43 mg/dL (ref >39)
LDL Calculated: 98 mg/dL (ref 0–99)
Triglycerides: 133 mg/dL (ref 0–149)
VLDL Cholesterol Cal: 27 mg/dL (ref 5–40)

## 2017-02-04 LAB — VITAMIN D 25 HYDROXY (VIT D DEFICIENCY, FRACTURES): Vit D, 25-Hydroxy: 63 ng/mL (ref 30.0–100.0)

## 2017-02-10 LAB — SPECIMEN STATUS REPORT

## 2017-02-10 LAB — HGB A1C W/O EAG: Hgb A1c MFr Bld: 5.9 % — ABNORMAL HIGH (ref 4.8–5.6)

## 2017-02-11 ENCOUNTER — Ambulatory Visit (HOSPITAL_COMMUNITY)
Admission: RE | Admit: 2017-02-11 | Discharge: 2017-02-11 | Disposition: A | Discharge status: Home / Self Care | Payer: Medicare HMO | Source: Ambulatory Visit | Attending: Family Medicine | Admitting: Family Medicine

## 2017-02-11 ENCOUNTER — Other Ambulatory Visit: Payer: Self-pay | Admitting: *Deleted

## 2017-02-11 DIAGNOSIS — I6523 Occlusion and stenosis of bilateral carotid arteries: Secondary | ICD-10-CM | POA: Diagnosis not present

## 2017-02-11 DIAGNOSIS — I421 Obstructive hypertrophic cardiomyopathy: Secondary | ICD-10-CM | POA: Diagnosis not present

## 2017-02-11 DIAGNOSIS — R0989 Other specified symptoms and signs involving the circulatory and respiratory systems: Secondary | ICD-10-CM | POA: Diagnosis present

## 2017-02-11 DIAGNOSIS — R221 Localized swelling, mass and lump, neck: Secondary | ICD-10-CM | POA: Diagnosis present

## 2017-02-11 DIAGNOSIS — E041 Nontoxic single thyroid nodule: Secondary | ICD-10-CM | POA: Diagnosis not present

## 2017-02-16 ENCOUNTER — Ambulatory Visit (HOSPITAL_COMMUNITY)
Admission: RE | Admit: 2017-02-16 | Discharge: 2017-02-16 | Disposition: A | Discharge status: Home / Self Care | Payer: Medicare HMO | Source: Ambulatory Visit | Attending: Family Medicine | Admitting: Family Medicine

## 2017-02-16 ENCOUNTER — Other Ambulatory Visit: Payer: Self-pay | Admitting: Family Medicine

## 2017-02-16 DIAGNOSIS — E041 Nontoxic single thyroid nodule: Secondary | ICD-10-CM | POA: Diagnosis not present

## 2017-02-16 DIAGNOSIS — E042 Nontoxic multinodular goiter: Secondary | ICD-10-CM | POA: Diagnosis not present

## 2017-02-28 ENCOUNTER — Other Ambulatory Visit: Payer: Self-pay | Admitting: Family Medicine

## 2017-03-14 ENCOUNTER — Other Ambulatory Visit: Payer: Self-pay | Admitting: Family Medicine

## 2017-03-25 DIAGNOSIS — R69 Illness, unspecified: Secondary | ICD-10-CM | POA: Diagnosis not present

## 2017-03-26 ENCOUNTER — Other Ambulatory Visit: Payer: Self-pay

## 2017-03-26 DIAGNOSIS — I10 Essential (primary) hypertension: Secondary | ICD-10-CM

## 2017-03-26 MED ORDER — METOPROLOL TARTRATE 25 MG PO TABS: ORAL_TABLET | ORAL | 1 refills | Status: DC

## 2017-04-14 ENCOUNTER — Other Ambulatory Visit: Payer: Self-pay | Admitting: Family Medicine

## 2017-04-21 ENCOUNTER — Other Ambulatory Visit: Payer: Self-pay | Admitting: Pediatrics

## 2017-04-27 DIAGNOSIS — R69 Illness, unspecified: Secondary | ICD-10-CM | POA: Diagnosis not present

## 2017-05-12 DIAGNOSIS — R69 Illness, unspecified: Secondary | ICD-10-CM | POA: Diagnosis not present

## 2017-05-20 ENCOUNTER — Other Ambulatory Visit: Payer: Self-pay | Admitting: Family Medicine

## 2017-06-02 ENCOUNTER — Other Ambulatory Visit: Payer: Self-pay

## 2017-06-02 MED ORDER — CYANOCOBALAMIN 1000 MCG/ML IJ SOLN: INTRAMUSCULAR | 0 refills | Status: DC

## 2017-06-03 ENCOUNTER — Other Ambulatory Visit: Payer: Self-pay | Admitting: *Deleted

## 2017-06-10 ENCOUNTER — Telehealth: Payer: Self-pay | Admitting: Family Medicine

## 2017-06-10 DIAGNOSIS — Z0289 Encounter for other administrative examinations: Secondary | ICD-10-CM

## 2017-06-19 ENCOUNTER — Encounter: Payer: Self-pay | Admitting: Nurse Practitioner

## 2017-06-19 ENCOUNTER — Ambulatory Visit (INDEPENDENT_AMBULATORY_CARE_PROVIDER_SITE_OTHER): Payer: Medicare HMO | Admitting: Nurse Practitioner

## 2017-06-19 VITALS — BP 152/93 | HR 97 | Temp 96.9°F | Ht 67.0 in | Wt 177.0 lb

## 2017-06-19 DIAGNOSIS — H6522 Chronic serous otitis media, left ear: Secondary | ICD-10-CM | POA: Diagnosis not present

## 2017-06-19 DIAGNOSIS — J069 Acute upper respiratory infection, unspecified: Secondary | ICD-10-CM

## 2017-06-19 MED ORDER — FLUTICASONE PROPIONATE 50 MCG/ACT NA SUSP: 2.0000 | Freq: Every day | NASAL | 6 refills | Status: DC

## 2017-06-19 NOTE — Progress Notes (Signed)
   Subjective:    Patient ID: Jimmy Park, male    DOB: 1950-09-14, 67 y.o.   MRN: [id number]  HPI Patient comes in today c/o congestion, cough and ear ache. Started 1 month ago. Has gotten no better.   Review of Systems  Constitutional: Negative for chills and fever.  HENT: Positive for congestion, ear pain, rhinorrhea and sinus pressure. Negative for sore throat and trouble swallowing.   Respiratory: Positive for cough (nonproductive).   Cardiovascular: Negative.   Neurological: Positive for headaches.  Psychiatric/Behavioral: Negative.   All other systems reviewed and are negative.      Objective:   Physical Exam  Constitutional: He is oriented to person, place, and time. He appears well-developed and well-nourished. No distress.  HENT:  Right Ear: Hearing, tympanic membrane, external ear and ear canal normal.  Left Ear: Hearing, external ear and ear canal normal. A middle ear effusion is present.  Nose: Mucosal edema and rhinorrhea present. Right sinus exhibits no maxillary sinus tenderness and no frontal sinus tenderness. Left sinus exhibits no maxillary sinus tenderness and no frontal sinus tenderness.  Mouth/Throat: Uvula is midline, oropharynx is clear and moist and mucous membranes are normal.  Cardiovascular: Normal rate and regular rhythm.  Pulmonary/Chest: Effort normal. He has rales (bil lower lobes).  Neurological: He is alert and oriented to person, place, and time.  Skin: Skin is warm.  Psychiatric: He has a normal mood and affect. His behavior is normal. Judgment and thought content normal.    BP (!) 152/93 (Cuff Size: Normal)   Pulse 97   Temp (!) 96.9 F (36.1 C) (Oral)   Ht 5\' 7"  (1.702 m)   Wt 177 lb (80.3 kg)   BMI 27.72 kg/m        Assessment & Plan:   1. Left chronic serous otitis media   2. Viral upper respiratory tract infection    Meds ordered this encounter  Medications  . fluticasone (FLONASE) 50 MCG/ACT nasal spray    Sig: Place 2  sprays into both nostrils daily.    Dispense:  16 g    Refill:  6    Order Specific Question:   Supervising Provider    Answer:   VINCENT, CAROL L [[id number]]   1. Take meds as prescribed 2. Use a cool mist humidifier especially during the winter months and when heat has been humid. 3. Use saline nose sprays frequently 4. Saline irrigations of the nose can be very helpful if done frequently.  * 4X daily for 1 week*  * Use of a nettie pot can be helpful with this. Follow directions with this* 5. Drink plenty of fluids 6. Keep thermostat turn down low 7.For any cough or congestion  Use plain Mucinex- regular strength or max strength is fine   * Children- consult with Pharmacist for dosing 8. For fever or aces or pains- take tylenol or ibuprofen appropriate for age and weight.  * for fevers greater than 101 orally you may alternate ibuprofen and tylenol every  3 hours.   [name] Done, FNP

## 2017-06-19 NOTE — Patient Instructions (Signed)

## 2017-06-21 MED ORDER — AZITHROMYCIN 250 MG PO TABS: ORAL_TABLET | ORAL | 0 refills | Status: DC

## 2017-06-21 MED ORDER — HYDROCODONE-HOMATROPINE 5-1.5 MG/5ML PO SYRP
5.0000 mL | ORAL_SOLUTION | Freq: Four times a day (QID) | ORAL | 0 refills | Status: DC | PRN
Start: 2017-06-21 — End: 2017-08-06

## 2017-06-21 NOTE — Telephone Encounter (Signed)
What symptoms do you have? Cough will not let pt sleep and same symptoms no better  How long have you been sick? 06/19/17  Have you been seen for this problem? 06/19/17  If your provider decides to give you a prescription, which pharmacy would you like for it to be sent to? [facility] in [facility]   Patient informed that this information will be sent to the clinical staff for review and that they should receive a follow up call.

## 2017-06-21 NOTE — Addendum Note (Signed)
Addended by: Chevis Pretty on: 06/21/2017 10:56 AM   Modules accepted: Orders

## 2017-06-21 NOTE — Telephone Encounter (Signed)
Patient aware that cough syrup has been sent to pharmacy.  Patient would also like to have an antibiotic sent to the pharmacy. Patient had fevers last night and is feeling worse than he did on Saturday

## 2017-06-21 NOTE — Telephone Encounter (Signed)
zpak sent to pharmacy 

## 2017-06-21 NOTE — Telephone Encounter (Signed)
Sent in hycodan cough meds to his pharmacy

## 2017-06-21 NOTE — Telephone Encounter (Signed)
Aware that zpack has been sent to pharmacy

## 2017-07-07 ENCOUNTER — Other Ambulatory Visit: Payer: Self-pay | Admitting: *Deleted

## 2017-07-07 MED ORDER — FEXOFENADINE HCL 180 MG PO TABS: ORAL_TABLET | ORAL | 1 refills | Status: DC

## 2017-07-27 DIAGNOSIS — R69 Illness, unspecified: Secondary | ICD-10-CM | POA: Diagnosis not present

## 2017-08-03 ENCOUNTER — Other Ambulatory Visit: Payer: Self-pay | Admitting: *Deleted

## 2017-08-03 MED ORDER — OMEPRAZOLE 40 MG PO CPDR: 40.0000 mg | DELAYED_RELEASE_CAPSULE | Freq: Every day | ORAL | 0 refills | Status: DC

## 2017-08-05 DIAGNOSIS — R69 Illness, unspecified: Secondary | ICD-10-CM | POA: Diagnosis not present

## 2017-08-06 ENCOUNTER — Encounter: Payer: Self-pay | Admitting: Family Medicine

## 2017-08-06 ENCOUNTER — Ambulatory Visit (INDEPENDENT_AMBULATORY_CARE_PROVIDER_SITE_OTHER): Payer: Medicare HMO | Admitting: Family Medicine

## 2017-08-06 VITALS — BP 131/83 | HR 61 | Temp 97.0°F | Ht 67.0 in | Wt 177.2 lb

## 2017-08-06 DIAGNOSIS — I421 Obstructive hypertrophic cardiomyopathy: Secondary | ICD-10-CM

## 2017-08-06 DIAGNOSIS — J4520 Mild intermittent asthma, uncomplicated: Secondary | ICD-10-CM

## 2017-08-06 DIAGNOSIS — R7303 Prediabetes: Secondary | ICD-10-CM | POA: Diagnosis not present

## 2017-08-06 DIAGNOSIS — I10 Essential (primary) hypertension: Secondary | ICD-10-CM | POA: Diagnosis not present

## 2017-08-06 DIAGNOSIS — Z23 Encounter for immunization: Secondary | ICD-10-CM

## 2017-08-06 DIAGNOSIS — E538 Deficiency of other specified B group vitamins: Secondary | ICD-10-CM

## 2017-08-06 LAB — BAYER DCA HB A1C WAIVED: HB A1C (BAYER DCA - WAIVED): 5.6 % (ref <7.0)

## 2017-08-06 MED ORDER — OMEPRAZOLE 40 MG PO CPDR: 40.0000 mg | DELAYED_RELEASE_CAPSULE | Freq: Every day | ORAL | 0 refills | Status: DC

## 2017-08-06 MED ORDER — MONTELUKAST SODIUM 10 MG PO TABS: 10.0000 mg | ORAL_TABLET | Freq: Every day | ORAL | 1 refills | Status: DC

## 2017-08-06 MED ORDER — METOPROLOL TARTRATE 25 MG PO TABS: ORAL_TABLET | ORAL | 1 refills | Status: DC

## 2017-08-06 MED ORDER — LISINOPRIL 20 MG PO TABS: ORAL_TABLET | ORAL | 1 refills | Status: DC

## 2017-08-06 NOTE — Patient Instructions (Signed)

## 2017-08-06 NOTE — Progress Notes (Signed)
Subjective:  Patient ID: Jimmy Park, male    DOB: 09-08-1950  Age: 67 y.o. MRN: [id number]  CC: Hypertension (pt here today for routine follow up of his chronic medical conditions, no other concerns voiced.)   HPI Jimmy Park presents for  follow-up of hypertension. Patient has no history of headache chest pain or shortness of breath or recent cough. Patient also denies symptoms of TIA such as focal numbness or weakness. Patient denies side effects from medication. States taking it regularly.   History Jimmy Park has a past medical history of Allergy, Anemia, Asthma, B12 deficiency, Cerebral ventriculomegaly, Gait abnormality, and Hypertension.   Jimmy Park has a past surgical history that includes Lumbar drain implantation.   His family history includes Asthma in his brother; Diabetes in his father and mother; Hypertension in his father and mother.Jimmy Park reports that Jimmy Park quit smoking about 44 years ago. His smoking use included cigarettes. Jimmy Park started smoking about 50 years ago. Jimmy Park smoked 0.50 packs per day. Jimmy Park has never used smokeless tobacco. Jimmy Park reports that Jimmy Park drinks alcohol. Jimmy Park reports that Jimmy Park does not use drugs.  Current Outpatient Medications on File Prior to Visit  Medication Sig Dispense Refill  . albuterol (PROVENTIL HFA;VENTOLIN HFA) 108 (90 Base) MCG/ACT inhaler INHALE 1 PUFF INTO THE LUNGS EVERY 4 HOURS AS NEEDED 36 Inhaler 1  . aspirin 81 MG tablet Take 1 tablet by mouth daily.    . cyanocobalamin (,VITAMIN B-12,) 1000 MCG/ML injection INJECT 1 ML INTRAMUSCULARLY EVERY 30 DAYS 1 mL 2  . DHA-EPA-VITAMIN E PO Take 1 tablet by mouth daily.    Jimmy Park Sodium 100 MG capsule Take 1 tablet by mouth at bedtime.    . fexofenadine (ALLEGRA) 180 MG tablet TAKE 1 TABLET (180 MG TOTAL) BY MOUTH DAILY. 90 tablet 1  . fluticasone (FLONASE) 50 MCG/ACT nasal spray Place 2 sprays into both nostrils daily. 16 g 6  . folic acid (FOLVITE) 1 MG tablet Take 1 tablet by mouth daily.    [name] ibuprofen  (ADVIL,MOTRIN) 400 MG tablet Take 1 tablet by mouth 2 (two) times daily as needed.    . neomycin-polymyxin-hydrocortisone (CORTISPORIN) OTIC solution Place 3 drops into both ears 4 (four) times daily. 10 mL 0   No current facility-administered medications on file prior to visit.     ROS Review of Systems  Constitutional: Negative for chills, diaphoresis, fever and unexpected weight change.  HENT: Negative for congestion, hearing loss, rhinorrhea and sore throat.   Eyes: Negative for visual disturbance.  Respiratory: Negative for cough and shortness of breath.   Cardiovascular: Negative for chest pain.  Gastrointestinal: Negative for abdominal pain, constipation and diarrhea.  Genitourinary: Negative for dysuria and flank pain.  Musculoskeletal: Negative for arthralgias and joint swelling.  Skin: Negative for rash.  Neurological: Positive for headaches (over left eye). Negative for dizziness.  Psychiatric/Behavioral: Negative for dysphoric mood and sleep disturbance.    Objective:  BP 131/83   Pulse 61   Temp (!) 97 F (36.1 C) (Oral)   Ht '5\' 7"'$  (1.702 m)   Wt 177 lb 4 oz (80.4 kg)   BMI 27.76 kg/m   BP Readings from Last 3 Encounters:  08/06/17 131/83  06/19/17 (!) 152/93  02/03/17 120/75    Wt Readings from Last 3 Encounters:  08/06/17 177 lb 4 oz (80.4 kg)  06/19/17 177 lb (80.3 kg)  02/03/17 177 lb (80.3 kg)     Physical Exam  Constitutional: Jimmy Park is oriented to person, place, and time.  Jimmy Park appears well-developed and well-nourished. No distress.  HENT:  Head: Normocephalic and atraumatic.  Right Ear: External ear normal.  Left Ear: External ear normal.  Nose: Nose normal.  Mouth/Throat: Oropharynx is clear and moist.  Eyes: Conjunctivae and EOM are normal. Pupils are equal, round, and reactive to light.  Neck: Normal range of motion. Neck supple. No thyromegaly present.  Cardiovascular: Normal rate, regular rhythm and normal heart sounds.  No murmur  heard. Pulmonary/Chest: Effort normal and breath sounds normal. No respiratory distress. Jimmy Park has no wheezes. Jimmy Park has no rales.  Abdominal: Soft. Bowel sounds are normal. Jimmy Park exhibits no distension. There is no tenderness.  Lymphadenopathy:    Jimmy Park has no cervical adenopathy.  Neurological: Jimmy Park is alert and oriented to person, place, and time. Jimmy Park has normal reflexes.  Skin: Skin is warm and dry.  Psychiatric: Jimmy Park has a normal mood and affect. His behavior is normal. Judgment and thought content normal.      Assessment & Plan:   Jimmy Park was seen today for hypertension.  Diagnoses and all orders for this visit:  Benign essential HTN -     CBC with Differential/Platelet -     CMP14+EGFR -     lisinopril (PRINIVIL,ZESTRIL) 20 MG tablet; TAKE 1 TABLET (20 MG TOTAL) BY MOUTH DAILY. -     metoprolol tartrate (LOPRESSOR) 25 MG tablet; TAKE 1 TABLET (25 MG TOTAL) BY MOUTH DAILY.  Mild intermittent asthma without complication -     montelukast (SINGULAIR) 10 MG tablet; Take 1 tablet (10 mg total) by mouth daily.  Cardiomyopathy, hypertrophic obstructive (HCC)  B12 deficiency -     Vitamin B12  Prediabetes -     Bayer DCA Hb A1c Waived  Other orders -     omeprazole (PRILOSEC) 40 MG capsule; Take 1 capsule (40 mg total) by mouth daily. -     Pneumococcal conjugate vaccine 13-valent   Allergies as of 08/06/2017   No Known Allergies     Medication List        Accurate as of 08/06/17 11:59 PM. Always use your most recent med list.          albuterol 108 (90 Base) MCG/ACT inhaler Commonly known as:  PROVENTIL HFA;VENTOLIN HFA INHALE 1 PUFF INTO THE LUNGS EVERY 4 HOURS AS NEEDED   aspirin 81 MG tablet Take 1 tablet by mouth daily.   cyanocobalamin 1000 MCG/ML injection Commonly known as:  (VITAMIN B-12) INJECT 1 ML INTRAMUSCULARLY EVERY 30 DAYS   DHA-EPA-VITAMIN E PO Take 1 tablet by mouth daily.   Docusate Sodium 100 MG capsule Take 1 tablet by mouth at bedtime.   fexofenadine  180 MG tablet Commonly known as:  ALLEGRA TAKE 1 TABLET (180 MG TOTAL) BY MOUTH DAILY.   fluticasone 50 MCG/ACT nasal spray Commonly known as:  FLONASE Place 2 sprays into both nostrils daily.   folic acid 1 MG tablet Commonly known as:  FOLVITE Take 1 tablet by mouth daily.   ibuprofen 400 MG tablet Commonly known as:  ADVIL,MOTRIN Take 1 tablet by mouth 2 (two) times daily as needed.   lisinopril 20 MG tablet Commonly known as:  PRINIVIL,ZESTRIL TAKE 1 TABLET (20 MG TOTAL) BY MOUTH DAILY.   metoprolol tartrate 25 MG tablet Commonly known as:  LOPRESSOR TAKE 1 TABLET (25 MG TOTAL) BY MOUTH DAILY.   montelukast 10 MG tablet Commonly known as:  SINGULAIR Take 1 tablet (10 mg total) by mouth daily.   neomycin-polymyxin-hydrocortisone OTIC solution Commonly known  as:  CORTISPORIN Place 3 drops into both ears 4 (four) times daily.   omeprazole 40 MG capsule Commonly known as:  PRILOSEC Take 1 capsule (40 mg total) by mouth daily.       Meds ordered this encounter  Medications  . lisinopril (PRINIVIL,ZESTRIL) 20 MG tablet    Sig: TAKE 1 TABLET (20 MG TOTAL) BY MOUTH DAILY.    Dispense:  90 tablet    Refill:  1  . metoprolol tartrate (LOPRESSOR) 25 MG tablet    Sig: TAKE 1 TABLET (25 MG TOTAL) BY MOUTH DAILY.    Dispense:  90 tablet    Refill:  1  . montelukast (SINGULAIR) 10 MG tablet    Sig: Take 1 tablet (10 mg total) by mouth daily.    Dispense:  90 tablet    Refill:  1  . omeprazole (PRILOSEC) 40 MG capsule    Sig: Take 1 capsule (40 mg total) by mouth daily.    Dispense:  90 capsule    Refill:  0     Follow-up: No Follow-up on file.  Claretta Fraise, M.D.

## 2017-08-07 LAB — CBC WITH DIFFERENTIAL/PLATELET
Basophils Absolute: 0 10*3/uL (ref 0.0–0.2)
Basos: 1 %
EOS (ABSOLUTE): 0.2 10*3/uL (ref 0.0–0.4)
Eos: 4 %
Hematocrit: 43.8 % (ref 37.5–51.0)
Hemoglobin: 14.9 g/dL (ref 13.0–17.7)
Immature Grans (Abs): 0 10*3/uL (ref 0.0–0.1)
Immature Granulocytes: 0 %
Lymphocytes Absolute: 1.6 10*3/uL (ref 0.7–3.1)
Lymphs: 33 %
MCH: 33.1 pg — ABNORMAL HIGH (ref 26.6–33.0)
MCHC: 34 g/dL (ref 31.5–35.7)
MCV: 97 fL (ref 79–97)
Monocytes Absolute: 0.5 10*3/uL (ref 0.1–0.9)
Monocytes: 9 %
Neutrophils Absolute: 2.6 10*3/uL (ref 1.4–7.0)
Neutrophils: 53 %
Platelets: 222 10*3/uL (ref 150–379)
RBC: 4.5 x10E6/uL (ref 4.14–5.80)
RDW: 13 % (ref 12.3–15.4)
WBC: 4.9 10*3/uL (ref 3.4–10.8)

## 2017-08-07 LAB — CMP14+EGFR
ALT: 62 IU/L — ABNORMAL HIGH (ref 0–44)
AST: 47 IU/L — ABNORMAL HIGH (ref 0–40)
Albumin/Globulin Ratio: 1.8 (ref 1.2–2.2)
Albumin: 4.7 g/dL (ref 3.6–4.8)
Alkaline Phosphatase: 77 IU/L (ref 39–117)
BUN/Creatinine Ratio: 18 (ref 10–24)
BUN: 18 mg/dL (ref 8–27)
Bilirubin Total: 0.5 mg/dL (ref 0.0–1.2)
CO2: 24 mmol/L (ref 20–29)
Calcium: 9.4 mg/dL (ref 8.6–10.2)
Chloride: 104 mmol/L (ref 96–106)
Creatinine, Ser: 1.02 mg/dL (ref 0.76–1.27)
GFR calc Af Amer: 88 mL/min/{1.73_m2} (ref >59)
GFR calc non Af Amer: 76 mL/min/{1.73_m2} (ref >59)
Globulin, Total: 2.6 g/dL (ref 1.5–4.5)
Glucose: 114 mg/dL — ABNORMAL HIGH (ref 65–99)
Potassium: 4.7 mmol/L (ref 3.5–5.2)
Sodium: 144 mmol/L (ref 134–144)
Total Protein: 7.3 g/dL (ref 6.0–8.5)

## 2017-08-07 LAB — VITAMIN B12: Vitamin B-12: 964 pg/mL (ref 232–1245)

## 2017-08-17 DIAGNOSIS — H524 Presbyopia: Secondary | ICD-10-CM | POA: Diagnosis not present

## 2017-08-17 DIAGNOSIS — H35033 Hypertensive retinopathy, bilateral: Secondary | ICD-10-CM | POA: Diagnosis not present

## 2017-08-20 ENCOUNTER — Encounter: Payer: Self-pay | Admitting: Family Medicine

## 2017-08-20 ENCOUNTER — Ambulatory Visit (INDEPENDENT_AMBULATORY_CARE_PROVIDER_SITE_OTHER): Payer: Medicare HMO | Admitting: Family Medicine

## 2017-08-20 ENCOUNTER — Ambulatory Visit (INDEPENDENT_AMBULATORY_CARE_PROVIDER_SITE_OTHER): Payer: Medicare HMO

## 2017-08-20 VITALS — BP 142/82 | HR 64 | Temp 96.8°F | Ht 67.0 in | Wt 178.2 lb

## 2017-08-20 DIAGNOSIS — S7001XA Contusion of right hip, initial encounter: Secondary | ICD-10-CM | POA: Diagnosis not present

## 2017-08-20 DIAGNOSIS — M25551 Pain in right hip: Secondary | ICD-10-CM

## 2017-08-20 DIAGNOSIS — M160 Bilateral primary osteoarthritis of hip: Secondary | ICD-10-CM | POA: Diagnosis not present

## 2017-08-20 DIAGNOSIS — S7011XA Contusion of right thigh, initial encounter: Secondary | ICD-10-CM | POA: Diagnosis not present

## 2017-08-20 NOTE — Progress Notes (Signed)
Subjective:  Patient ID: Savion Washam, male    DOB: 06-07-1951  Age: 67 y.o. MRN: [id number]  CC: right hip pain from falling down a bank   HPI [facility] presents for patient was spreading some gravel and slipped in the mud and slid down the bank.  He does not member any impact or injury other than sliding down the bank but he has a large bruise at the right lateral thigh near the hip.  Pain is moderate he is able to ambulate.  This occurred yesterday afternoon.  They went to the urgent care but there was no x-ray available.  Depression screen [facility] 2/9 08/20/2017 08/06/2017 06/19/2017  Decreased Interest 0 0 0  Down, Depressed, Hopeless 0 0 0  PHQ - 2 Score 0 0 0    History Emmert has a past medical history of Allergy, Anemia, Asthma, B12 deficiency, Cerebral ventriculomegaly, Gait abnormality, and Hypertension.   He has a past surgical history that includes Lumbar drain implantation.   His family history includes Asthma in his brother; Diabetes in his father and mother; Hypertension in his father and mother.He reports that he quit smoking about 44 years ago. His smoking use included cigarettes. He started smoking about 50 years ago. He smoked 0.50 packs per day. he has never used smokeless tobacco. He reports that he drinks alcohol. He reports that he does not use drugs.    ROS Review of Systems Noncontributory Objective:  BP (!) 142/82   Pulse 64   Temp (!) 96.8 F (36 C) (Oral)   Ht 5\' 7"  (1.702 m)   Wt 178 lb 3.2 oz (80.8 kg)   BMI 27.91 kg/m   BP Readings from Last 3 Encounters:  08/20/17 (!) 142/82  08/06/17 131/83  06/19/17 (!) 152/93    Wt Readings from Last 3 Encounters:  08/20/17 178 lb 3.2 oz (80.8 kg)  08/06/17 177 lb 4 oz (80.4 kg)  06/19/17 177 lb (80.3 kg)     Physical Exam  Constitutional: He appears well-developed and well-nourished. No distress.  HENT:  Head: Normocephalic and atraumatic.  Eyes: EOM are normal. Pupils are equal, round, and  reactive to light.  Cardiovascular: Normal rate and regular rhythm.  No murmur heard. Pulmonary/Chest: No respiratory distress.  Musculoskeletal: Normal range of motion. He exhibits tenderness (Noted at the right lateral hip and proximal lateral thigh with 6 x 12 inch hematoma formation and moderate edema.  There is full range of motion passively of the hip.  X-ray of the hip shows mild arthritis no signs of fracture.).      Assessment & Plan:   Stark was seen today for right hip pain from falling down a bank.  Diagnoses and all orders for this visit:  Pain of right hip joint -     DG HIP UNILAT W OR W/O PELVIS 2-3 VIEWS RIGHT; Future  Contusion of right hip and thigh, initial encounter    I am having Norton Lynes maintain his aspirin, Docusate Sodium, folic acid, DHA-EPA-VITAMIN E PO, ibuprofen, neomycin-polymyxin-hydrocortisone, cyanocobalamin, albuterol, fluticasone, fexofenadine, lisinopril, metoprolol tartrate, montelukast, and omeprazole.  Allergies as of 08/20/2017   No Known Allergies     Medication List        Accurate as of 08/20/17  5:44 PM. Always use your most recent med list.          albuterol 108 (90 Base) MCG/ACT inhaler Commonly known as:  PROVENTIL HFA;VENTOLIN HFA INHALE 1 PUFF INTO THE LUNGS EVERY 4 HOURS  AS NEEDED   aspirin 81 MG tablet Take 1 tablet by mouth daily.   cyanocobalamin 1000 MCG/ML injection Commonly known as:  (VITAMIN B-12) INJECT 1 ML INTRAMUSCULARLY EVERY 30 DAYS   DHA-EPA-VITAMIN E PO Take 1 tablet by mouth daily.   Docusate Sodium 100 MG capsule Take 1 tablet by mouth at bedtime.   fexofenadine 180 MG tablet Commonly known as:  ALLEGRA TAKE 1 TABLET (180 MG TOTAL) BY MOUTH DAILY.   fluticasone 50 MCG/ACT nasal spray Commonly known as:  FLONASE Place 2 sprays into both nostrils daily.   folic acid 1 MG tablet Commonly known as:  FOLVITE Take 1 tablet by mouth daily.   ibuprofen 400 MG tablet Commonly known as:   ADVIL,MOTRIN Take 1 tablet by mouth 2 (two) times daily as needed.   lisinopril 20 MG tablet Commonly known as:  PRINIVIL,ZESTRIL TAKE 1 TABLET (20 MG TOTAL) BY MOUTH DAILY.   metoprolol tartrate 25 MG tablet Commonly known as:  LOPRESSOR TAKE 1 TABLET (25 MG TOTAL) BY MOUTH DAILY.   montelukast 10 MG tablet Commonly known as:  SINGULAIR Take 1 tablet (10 mg total) by mouth daily.   neomycin-polymyxin-hydrocortisone OTIC solution Commonly known as:  CORTISPORIN Place 3 drops into both ears 4 (four) times daily.   omeprazole 40 MG capsule Commonly known as:  PRILOSEC Take 1 capsule (40 mg total) by mouth daily.     Light activities only nothing strenuous.  Rest at home and apply heat frequently.  Avoid using a heating pad they could cause burns.  Follow-up: No Follow-up on file.  Claretta Fraise, M.D.

## 2017-08-24 ENCOUNTER — Telehealth: Payer: Self-pay | Admitting: Family Medicine

## 2017-08-24 NOTE — Telephone Encounter (Signed)
Labs mailed

## 2017-08-26 ENCOUNTER — Other Ambulatory Visit: Payer: Self-pay | Admitting: *Deleted

## 2017-08-26 MED ORDER — CYANOCOBALAMIN 1000 MCG/ML IJ SOLN: INTRAMUSCULAR | 1 refills | Status: DC

## 2017-10-04 DIAGNOSIS — R69 Illness, unspecified: Secondary | ICD-10-CM | POA: Diagnosis not present

## 2017-11-01 ENCOUNTER — Other Ambulatory Visit: Payer: Self-pay | Admitting: Family Medicine

## 2017-11-12 ENCOUNTER — Other Ambulatory Visit: Payer: Self-pay | Admitting: Nurse Practitioner

## 2017-11-12 DIAGNOSIS — J069 Acute upper respiratory infection, unspecified: Secondary | ICD-10-CM

## 2017-11-22 DIAGNOSIS — H40013 Open angle with borderline findings, low risk, bilateral: Secondary | ICD-10-CM | POA: Diagnosis not present

## 2017-11-22 DIAGNOSIS — H04123 Dry eye syndrome of bilateral lacrimal glands: Secondary | ICD-10-CM | POA: Diagnosis not present

## 2017-11-22 DIAGNOSIS — H2513 Age-related nuclear cataract, bilateral: Secondary | ICD-10-CM | POA: Diagnosis not present

## 2017-11-22 DIAGNOSIS — H16223 Keratoconjunctivitis sicca, not specified as Sjogren's, bilateral: Secondary | ICD-10-CM | POA: Diagnosis not present

## 2017-12-08 IMAGING — US US SOFT TISSUE HEAD/NECK
1 series · 6 of 6 positions shown · non-contrast
Comparison: None.

CLINICAL DATA: 65-year-old male with a history of soft tissue
finding of the neck

EXAM:
ULTRASOUND OF HEAD/NECK SOFT TISSUES
TECHNIQUE: Ultrasound examination of the head and neck soft tissues was
performed in the area of clinical concern.

[Series 1: us soft tissue head/neck · 0.07mm/px · 6 of 6 slices shown]
[im 1/6]
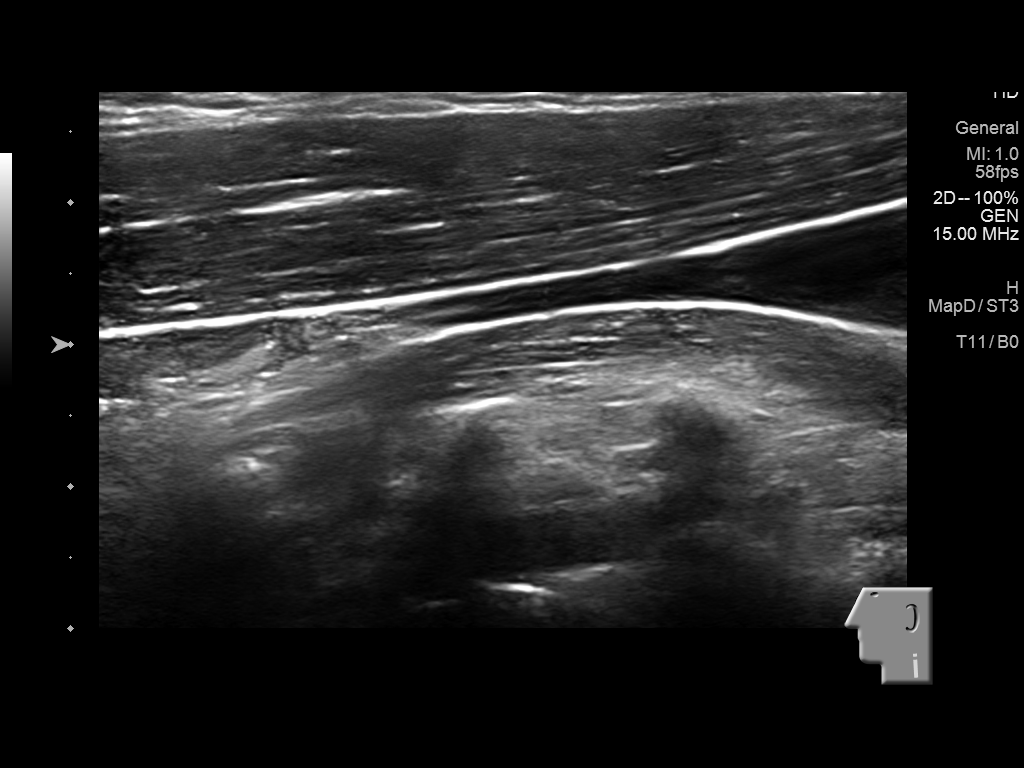
[im 2/6]
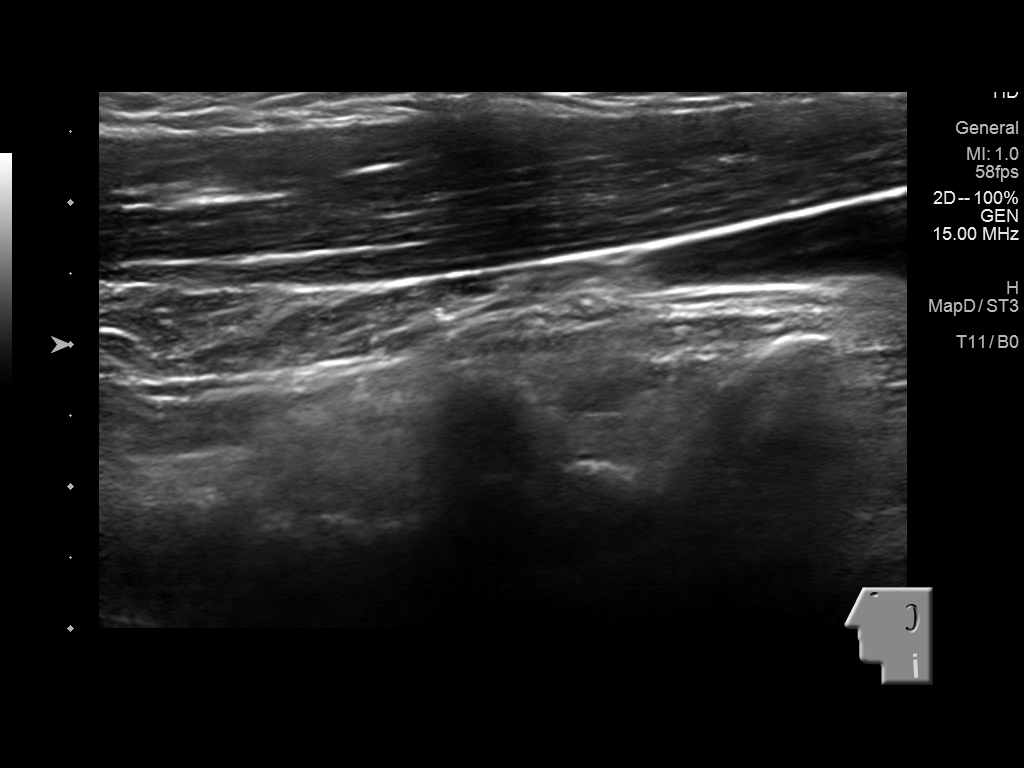
[im 3/6]
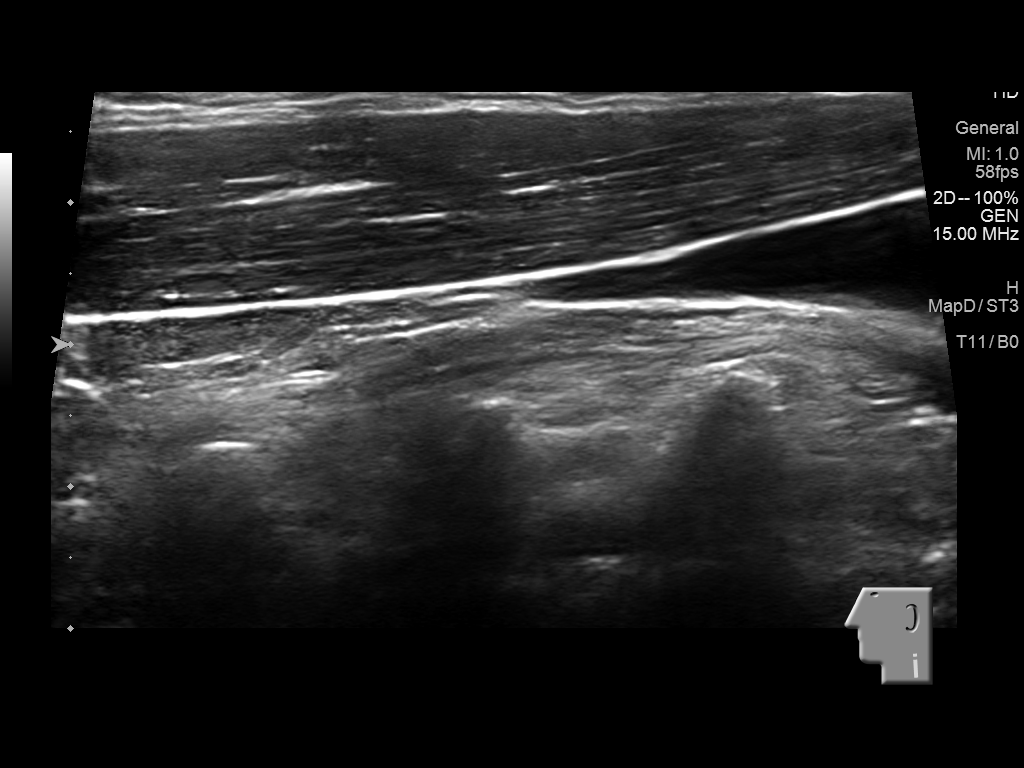
[im 4/6]
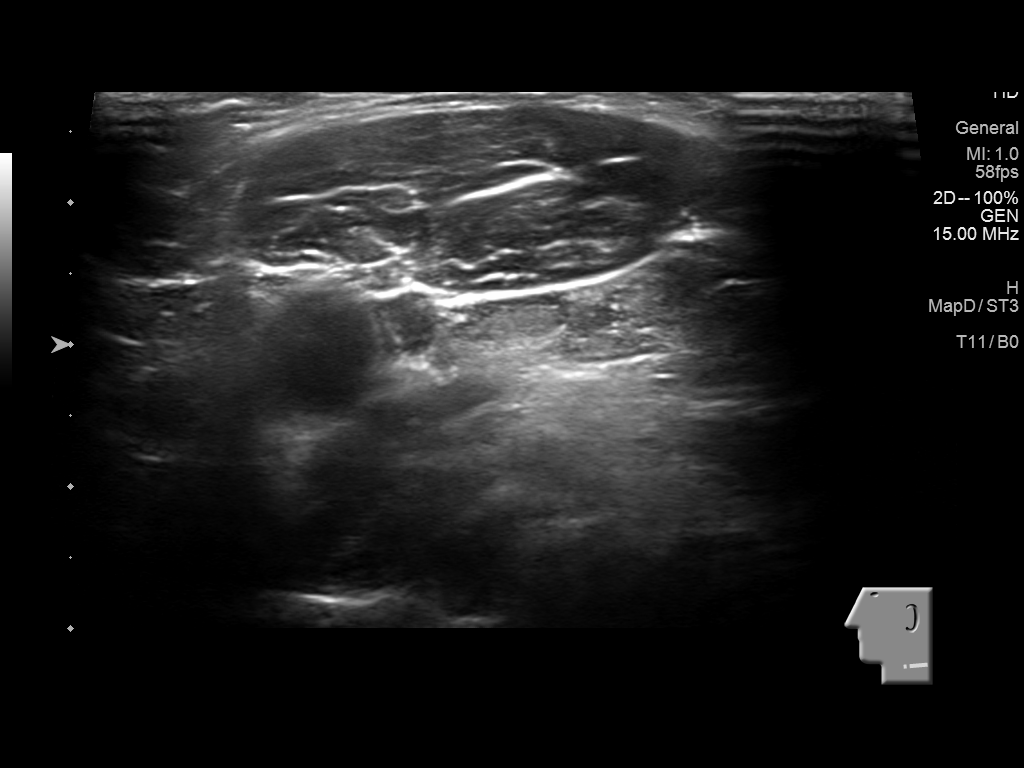
[im 5/6]
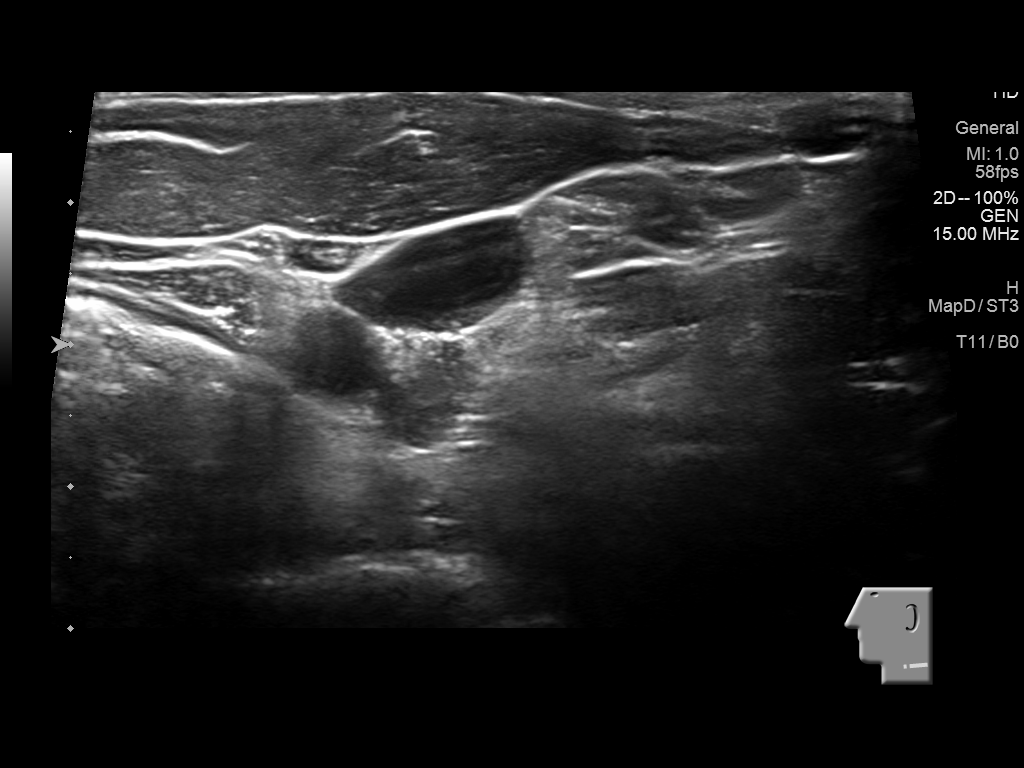
[im 6/6]
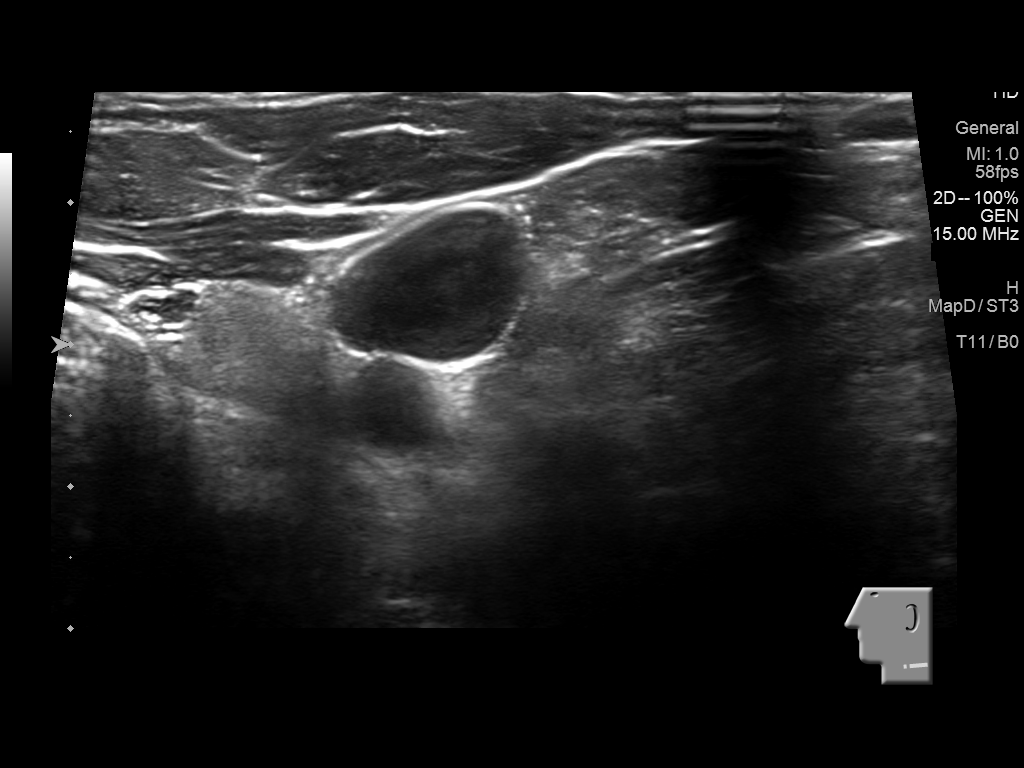

[6 of 6 positions shown; findings below may reference images not displayed]

FINDINGS: Grayscale and color duplex performed in the region of clinical
concern. No focal fluid collection or soft tissue lesion.
IMPRESSION: Unremarkable sonographic survey of the left neck in the region of
clinical concern.

## 2017-12-29 DIAGNOSIS — D225 Melanocytic nevi of trunk: Secondary | ICD-10-CM | POA: Diagnosis not present

## 2017-12-29 DIAGNOSIS — Z85828 Personal history of other malignant neoplasm of skin: Secondary | ICD-10-CM | POA: Diagnosis not present

## 2017-12-29 DIAGNOSIS — L570 Actinic keratosis: Secondary | ICD-10-CM | POA: Diagnosis not present

## 2017-12-29 DIAGNOSIS — D485 Neoplasm of uncertain behavior of skin: Secondary | ICD-10-CM | POA: Diagnosis not present

## 2018-01-01 ENCOUNTER — Other Ambulatory Visit: Payer: Self-pay | Admitting: Family Medicine

## 2018-01-29 ENCOUNTER — Other Ambulatory Visit: Payer: Self-pay | Admitting: Family Medicine

## 2018-01-29 NOTE — Telephone Encounter (Signed)
OV 02/08/18 

## 2018-01-31 DIAGNOSIS — R69 Illness, unspecified: Secondary | ICD-10-CM | POA: Diagnosis not present

## 2018-02-04 ENCOUNTER — Other Ambulatory Visit: Payer: Self-pay | Admitting: Family Medicine

## 2018-02-04 DIAGNOSIS — I10 Essential (primary) hypertension: Secondary | ICD-10-CM

## 2018-02-08 ENCOUNTER — Encounter: Payer: Self-pay | Admitting: Family Medicine

## 2018-02-08 ENCOUNTER — Ambulatory Visit (INDEPENDENT_AMBULATORY_CARE_PROVIDER_SITE_OTHER): Payer: Medicare HMO | Admitting: Family Medicine

## 2018-02-08 VITALS — BP 156/86 | HR 56 | Temp 97.0°F | Ht 67.0 in | Wt 178.6 lb

## 2018-02-08 DIAGNOSIS — I10 Essential (primary) hypertension: Secondary | ICD-10-CM

## 2018-02-08 DIAGNOSIS — J4520 Mild intermittent asthma, uncomplicated: Secondary | ICD-10-CM | POA: Diagnosis not present

## 2018-02-08 DIAGNOSIS — Z125 Encounter for screening for malignant neoplasm of prostate: Secondary | ICD-10-CM | POA: Diagnosis not present

## 2018-02-08 DIAGNOSIS — Z0000 Encounter for general adult medical examination without abnormal findings: Secondary | ICD-10-CM | POA: Diagnosis not present

## 2018-02-08 MED ORDER — VALSARTAN 320 MG PO TABS: 320.0000 mg | ORAL_TABLET | Freq: Every day | ORAL | 1 refills | Status: DC

## 2018-02-08 MED ORDER — NEOMYCIN-POLYMYXIN-HC 3.5-10000-1 OT SOLN: 3.0000 [drp] | Freq: Four times a day (QID) | OTIC | 0 refills | Status: DC

## 2018-02-08 MED ORDER — MONTELUKAST SODIUM 10 MG PO TABS: 10.0000 mg | ORAL_TABLET | Freq: Every day | ORAL | 1 refills | Status: DC

## 2018-02-08 MED ORDER — ALBUTEROL SULFATE HFA 108 (90 BASE) MCG/ACT IN AERS: 2.0000 | INHALATION_SPRAY | Freq: Four times a day (QID) | RESPIRATORY_TRACT | 0 refills | Status: DC | PRN

## 2018-02-08 MED ORDER — CETIRIZINE HCL 10 MG PO TABS: 10.0000 mg | ORAL_TABLET | Freq: Every day | ORAL | 3 refills | Status: DC

## 2018-02-08 MED ORDER — METOPROLOL TARTRATE 25 MG PO TABS: ORAL_TABLET | ORAL | 1 refills | Status: DC

## 2018-02-08 NOTE — Progress Notes (Signed)
Subjective:  Patient ID: Jimmy Park, male    DOB: Feb 12, 1951  Age: 67 y.o. MRN: [id number]  CC: Medical Management of Chronic Issues   HPI Jimmy Park presents for annual physical.  Follow-up of hypertension. Patient has no history of headache chest pain or shortness of breath or recent cough. Patient also denies symptoms of TIA such as numbness weakness lateralizing. Patient checks  blood pressure at home and has not had any elevated readings recently. Patient denies side effects from his medication. States taking it regularly. Patient in for follow-up of GERD. Currently asymptomatic taking  PPI daily. There is no chest pain or heartburn. No hematemesis and no melena. No dysphagia or choking. Onset is remote. Progression is stable. Complicating factors, none. Depression screen [facility] 2/9 02/08/2018 08/20/2017 08/06/2017  Decreased Interest 0 0 0  Down, Depressed, Hopeless 0 0 0  PHQ - 2 Score 0 0 0    History Jimmy Park has a past medical history of Allergy, Anemia, Asthma, B12 deficiency, Cerebral ventriculomegaly, Gait abnormality, and Hypertension.   He has a past surgical history that includes Lumbar drain implantation.   His family history includes Asthma in his brother; Diabetes in his father and mother; Hypertension in his father and mother.He reports that he quit smoking about 44 years ago. His smoking use included cigarettes. He started smoking about 50 years ago. He smoked 0.50 packs per day. He has never used smokeless tobacco. He reports that he drinks alcohol. He reports that he does not use drugs.    ROS Review of Systems  Constitutional: Negative.   HENT: Negative.   Eyes: Negative for visual disturbance.  Respiratory: Negative for cough and shortness of breath.   Cardiovascular: Negative for chest pain and leg swelling.  Gastrointestinal: Negative for abdominal pain, diarrhea, nausea and vomiting.  Genitourinary: Negative for difficulty urinating.  Musculoskeletal: Negative  for arthralgias and myalgias.  Skin: Negative for rash.  Neurological: Negative for headaches.  Psychiatric/Behavioral: Negative for sleep disturbance.    Objective:  BP (!) 156/86   Pulse (!) 56   Temp (!) 97 F (36.1 C) (Oral)   Ht '5\' 7"'$  (1.702 m)   Wt 178 lb 9.6 oz (81 kg)   BMI 27.97 kg/m    BP Readings from Last 3 Encounters:  02/08/18 (!) 156/86  08/20/17 (!) 142/82  08/06/17 131/83    Wt Readings from Last 3 Encounters:  02/08/18 178 lb 9.6 oz (81 kg)  08/20/17 178 lb 3.2 oz (80.8 kg)  08/06/17 177 lb 4 oz (80.4 kg)     Physical Exam  Constitutional: He is oriented to person, place, and time. He appears well-developed and well-nourished.  HENT:  Head: Normocephalic and atraumatic.  Mouth/Throat: Oropharynx is clear and moist.  Eyes: Pupils are equal, round, and reactive to light. EOM are normal.  Neck: Normal range of motion. No tracheal deviation present. No thyromegaly present.  Cardiovascular: Normal rate, regular rhythm and normal heart sounds. Exam reveals no gallop and no friction rub.  No murmur heard. Pulmonary/Chest: Breath sounds normal. He has no wheezes. He has no rales.  Abdominal: Soft. Bowel sounds are normal. He exhibits no distension and no mass. There is no tenderness. Hernia confirmed negative in the right inguinal area and confirmed negative in the left inguinal area.  Genitourinary: Testes normal and penis normal.  Musculoskeletal: Normal range of motion. He exhibits no edema.  Lymphadenopathy:    He has no cervical adenopathy.  Neurological: He is alert and oriented to  person, place, and time.  Skin: Skin is warm and dry.  Psychiatric: He has a normal mood and affect.      Assessment & Plan:   Jimmy Park was seen today for medical management of chronic issues.  Diagnoses and all orders for this visit:  Well adult exam  Mild intermittent asthma without complication -     montelukast (SINGULAIR) 10 MG tablet; Take 1 tablet (10 mg  total) by mouth daily. -     CBC with Differential/Platelet -     CMP14+EGFR -     Lipid panel -     PSA, total and free -     VITAMIN D 25 Hydroxy (Vit-D Deficiency, Fractures)  Benign essential HTN -     metoprolol tartrate (LOPRESSOR) 25 MG tablet; TAKE 1 TABLET (25 MG TOTAL) BY MOUTH DAILY. -     CBC with Differential/Platelet -     CMP14+EGFR -     Lipid panel -     PSA, total and free -     VITAMIN D 25 Hydroxy (Vit-D Deficiency, Fractures)  Other orders -     neomycin-polymyxin-hydrocortisone (CORTISPORIN) OTIC solution; Place 3 drops into both ears 4 (four) times daily. -     cetirizine (ZYRTEC) 10 MG tablet; Take 1 tablet (10 mg total) by mouth daily. -     valsartan (DIOVAN) 320 MG tablet; Take 1 tablet (320 mg total) by mouth daily. -     albuterol (PROVENTIL HFA;VENTOLIN HFA) 108 (90 Base) MCG/ACT inhaler; Inhale 2 puffs into the lungs every 6 (six) hours as needed for wheezing or shortness of breath.       I am having Jimmy Park start on cetirizine, valsartan, and albuterol. I am also having him maintain his aspirin, Docusate Sodium, folic acid, DHA-EPA-VITAMIN E PO, ibuprofen, albuterol, cyanocobalamin, fluticasone, fexofenadine, omeprazole, lisinopril, montelukast, neomycin-polymyxin-hydrocortisone, and metoprolol tartrate.  Allergies as of 02/08/2018   No Known Allergies     Medication List        Accurate as of 02/08/18 10:32 PM. Always use your most recent med list.          albuterol 108 (90 Base) MCG/ACT inhaler Commonly known as:  PROVENTIL HFA;VENTOLIN HFA INHALE 1 PUFF INTO THE LUNGS EVERY 4 HOURS AS NEEDED   albuterol 108 (90 Base) MCG/ACT inhaler Commonly known as:  PROVENTIL HFA;VENTOLIN HFA Inhale 2 puffs into the lungs every 6 (six) hours as needed for wheezing or shortness of breath.   aspirin 81 MG tablet Take 1 tablet by mouth daily.   cetirizine 10 MG tablet Commonly known as:  ZYRTEC Take 1 tablet (10 mg total) by mouth daily.     cyanocobalamin 1000 MCG/ML injection Commonly known as:  (VITAMIN B-12) INJECT 1 ML INTRAMUSCULARLY EVERY 30 DAYS   DHA-EPA-VITAMIN E PO Take 1 tablet by mouth daily.   Docusate Sodium 100 MG capsule Take 1 tablet by mouth at bedtime.   fexofenadine 180 MG tablet Commonly known as:  ALLEGRA TAKE 1 TABLET BY MOUTH EVERY DAY   fluticasone 50 MCG/ACT nasal spray Commonly known as:  FLONASE SPRAY 2 SPRAYS INTO EACH NOSTRIL EVERY DAY   folic acid 1 MG tablet Commonly known as:  FOLVITE Take 1 tablet by mouth daily.   ibuprofen 400 MG tablet Commonly known as:  ADVIL,MOTRIN Take 1 tablet by mouth 2 (two) times daily as needed.   lisinopril 20 MG tablet Commonly known as:  PRINIVIL,ZESTRIL TAKE 1 TABLET BY MOUTH EVERY DAY  metoprolol tartrate 25 MG tablet Commonly known as:  LOPRESSOR TAKE 1 TABLET (25 MG TOTAL) BY MOUTH DAILY.   montelukast 10 MG tablet Commonly known as:  SINGULAIR Take 1 tablet (10 mg total) by mouth daily.   neomycin-polymyxin-hydrocortisone OTIC solution Commonly known as:  CORTISPORIN Place 3 drops into both ears 4 (four) times daily.   omeprazole 40 MG capsule Commonly known as:  PRILOSEC TAKE 1 CAPSULE BY MOUTH EVERY DAY   valsartan 320 MG tablet Commonly known as:  DIOVAN Take 1 tablet (320 mg total) by mouth daily.        Follow-up: Return in about 6 months (around 08/09/2018).  Claretta Fraise, M.D.

## 2018-02-09 LAB — CMP14+EGFR
ALT: 48 IU/L — ABNORMAL HIGH (ref 0–44)
AST: 38 IU/L (ref 0–40)
Albumin/Globulin Ratio: 1.7 (ref 1.2–2.2)
Albumin: 4.7 g/dL (ref 3.6–4.8)
Alkaline Phosphatase: 79 IU/L (ref 39–117)
BUN/Creatinine Ratio: 17 (ref 10–24)
BUN: 16 mg/dL (ref 8–27)
Bilirubin Total: 0.6 mg/dL (ref 0.0–1.2)
CO2: 26 mmol/L (ref 20–29)
Calcium: 9.4 mg/dL (ref 8.6–10.2)
Chloride: 99 mmol/L (ref 96–106)
Creatinine, Ser: 0.92 mg/dL (ref 0.76–1.27)
GFR calc Af Amer: 99 mL/min/{1.73_m2} (ref >59)
GFR calc non Af Amer: 86 mL/min/{1.73_m2} (ref >59)
Globulin, Total: 2.7 g/dL (ref 1.5–4.5)
Glucose: 108 mg/dL — ABNORMAL HIGH (ref 65–99)
Potassium: 4.6 mmol/L (ref 3.5–5.2)
Sodium: 140 mmol/L (ref 134–144)
Total Protein: 7.4 g/dL (ref 6.0–8.5)

## 2018-02-09 LAB — CBC WITH DIFFERENTIAL/PLATELET
Basophils Absolute: 0.1 10*3/uL (ref 0.0–0.2)
Basos: 1 %
EOS (ABSOLUTE): 0.2 10*3/uL (ref 0.0–0.4)
Eos: 4 %
Hematocrit: 42.9 % (ref 37.5–51.0)
Hemoglobin: 15 g/dL (ref 13.0–17.7)
Immature Grans (Abs): 0 10*3/uL (ref 0.0–0.1)
Immature Granulocytes: 0 %
Lymphocytes Absolute: 1.7 10*3/uL (ref 0.7–3.1)
Lymphs: 33 %
MCH: 32.8 pg (ref 26.6–33.0)
MCHC: 35 g/dL (ref 31.5–35.7)
MCV: 94 fL (ref 79–97)
Monocytes Absolute: 0.5 10*3/uL (ref 0.1–0.9)
Monocytes: 10 %
Neutrophils Absolute: 2.6 10*3/uL (ref 1.4–7.0)
Neutrophils: 52 %
Platelets: 206 10*3/uL (ref 150–450)
RBC: 4.58 x10E6/uL (ref 4.14–5.80)
RDW: 12.1 % — ABNORMAL LOW (ref 12.3–15.4)
WBC: 5 10*3/uL (ref 3.4–10.8)

## 2018-02-09 LAB — VITAMIN D 25 HYDROXY (VIT D DEFICIENCY, FRACTURES): Vit D, 25-Hydroxy: 55 ng/mL (ref 30.0–100.0)

## 2018-02-09 LAB — LIPID PANEL
Chol/HDL Ratio: 4.3 ratio (ref 0.0–5.0)
Cholesterol, Total: 189 mg/dL (ref 100–199)
HDL: 44 mg/dL (ref >39)
LDL Calculated: 127 mg/dL — ABNORMAL HIGH (ref 0–99)
Triglycerides: 91 mg/dL (ref 0–149)
VLDL Cholesterol Cal: 18 mg/dL (ref 5–40)

## 2018-02-09 LAB — PSA, TOTAL AND FREE
PSA, Free Pct: 40 %
PSA, Free: 0.08 ng/mL
Prostate Specific Ag, Serum: 0.2 ng/mL (ref 0.0–4.0)

## 2018-02-11 ENCOUNTER — Telehealth: Payer: Self-pay | Admitting: Family Medicine

## 2018-02-11 NOTE — Telephone Encounter (Signed)
Results were released to [facility], patient aware

## 2018-02-24 ENCOUNTER — Other Ambulatory Visit: Payer: Self-pay | Admitting: Family Medicine

## 2018-03-17 ENCOUNTER — Ambulatory Visit (INDEPENDENT_AMBULATORY_CARE_PROVIDER_SITE_OTHER): Payer: Medicare HMO

## 2018-03-17 DIAGNOSIS — Z23 Encounter for immunization: Secondary | ICD-10-CM

## 2018-03-25 ENCOUNTER — Other Ambulatory Visit: Payer: Self-pay | Admitting: Family Medicine

## 2018-04-30 ENCOUNTER — Other Ambulatory Visit: Payer: Self-pay | Admitting: Family Medicine

## 2018-05-12 DIAGNOSIS — M79671 Pain in right foot: Secondary | ICD-10-CM | POA: Diagnosis not present

## 2018-05-12 DIAGNOSIS — M7662 Achilles tendinitis, left leg: Secondary | ICD-10-CM | POA: Diagnosis not present

## 2018-07-10 ENCOUNTER — Other Ambulatory Visit: Payer: Self-pay | Admitting: Family Medicine

## 2018-07-12 ENCOUNTER — Other Ambulatory Visit: Payer: Self-pay | Admitting: Family Medicine

## 2018-07-12 DIAGNOSIS — J4520 Mild intermittent asthma, uncomplicated: Secondary | ICD-10-CM

## 2018-07-21 ENCOUNTER — Other Ambulatory Visit: Payer: Self-pay | Admitting: Nurse Practitioner

## 2018-07-21 DIAGNOSIS — J069 Acute upper respiratory infection, unspecified: Secondary | ICD-10-CM

## 2018-08-01 ENCOUNTER — Other Ambulatory Visit: Payer: Self-pay | Admitting: Family Medicine

## 2018-08-01 NOTE — Telephone Encounter (Signed)
Last seen 9/19

## 2018-08-04 ENCOUNTER — Other Ambulatory Visit: Payer: Self-pay | Admitting: Family Medicine

## 2018-08-09 ENCOUNTER — Encounter: Payer: Self-pay | Admitting: Family Medicine

## 2018-08-09 ENCOUNTER — Ambulatory Visit (INDEPENDENT_AMBULATORY_CARE_PROVIDER_SITE_OTHER): Payer: Medicare HMO

## 2018-08-09 ENCOUNTER — Ambulatory Visit (INDEPENDENT_AMBULATORY_CARE_PROVIDER_SITE_OTHER): Payer: Medicare HMO | Admitting: Family Medicine

## 2018-08-09 VITALS — BP 144/74 | HR 59 | Temp 97.8°F | Ht 67.0 in | Wt 187.0 lb

## 2018-08-09 DIAGNOSIS — Z23 Encounter for immunization: Secondary | ICD-10-CM

## 2018-08-09 DIAGNOSIS — J4520 Mild intermittent asthma, uncomplicated: Secondary | ICD-10-CM | POA: Diagnosis not present

## 2018-08-09 DIAGNOSIS — G4485 Primary stabbing headache: Secondary | ICD-10-CM

## 2018-08-09 DIAGNOSIS — I10 Essential (primary) hypertension: Secondary | ICD-10-CM | POA: Diagnosis not present

## 2018-08-09 DIAGNOSIS — E538 Deficiency of other specified B group vitamins: Secondary | ICD-10-CM | POA: Diagnosis not present

## 2018-08-09 DIAGNOSIS — R05 Cough: Secondary | ICD-10-CM | POA: Diagnosis not present

## 2018-08-09 DIAGNOSIS — G9389 Other specified disorders of brain: Secondary | ICD-10-CM | POA: Diagnosis not present

## 2018-08-09 DIAGNOSIS — S41132A Puncture wound without foreign body of left upper arm, initial encounter: Secondary | ICD-10-CM | POA: Diagnosis not present

## 2018-08-09 DIAGNOSIS — R5383 Other fatigue: Secondary | ICD-10-CM

## 2018-08-09 MED ORDER — FLUTICASONE FUROATE-VILANTEROL 100-25 MCG/INH IN AEPB: 1.0000 | INHALATION_SPRAY | Freq: Every day | RESPIRATORY_TRACT | 11 refills | Status: DC

## 2018-08-09 MED ORDER — METOPROLOL TARTRATE 25 MG PO TABS: ORAL_TABLET | ORAL | 1 refills | Status: DC

## 2018-08-09 MED ORDER — VALSARTAN 320 MG PO TABS: 320.0000 mg | ORAL_TABLET | Freq: Every day | ORAL | 1 refills | Status: DC

## 2018-08-09 MED ORDER — MONTELUKAST SODIUM 10 MG PO TABS: 10.0000 mg | ORAL_TABLET | Freq: Every day | ORAL | 1 refills | Status: DC

## 2018-08-09 MED ORDER — OMEPRAZOLE 40 MG PO CPDR: DELAYED_RELEASE_CAPSULE | ORAL | 1 refills | Status: DC

## 2018-08-09 MED ORDER — NEOMYCIN-POLYMYXIN-HC 3.5-10000-1 OT SOLN: 3.0000 [drp] | Freq: Four times a day (QID) | OTIC | 0 refills | Status: DC

## 2018-08-09 NOTE — Progress Notes (Signed)
Your chest x-ray looked normal. Thanks, WS.

## 2018-08-09 NOTE — Progress Notes (Addendum)
Subjective:  Patient ID: Jimmy Park, male    DOB: Dec 19, 1950  Age: 68 y.o. MRN: [id number]  CC: Medical Management of Chronic Issues   HPI Erland Zeimet presents for  follow-up of hypertension. Patient has no history of headache chest pain or shortness of breath or recent cough. Patient also denies symptoms of TIA such as focal numbness or weakness. Patient denies side effects from medication. States taking it regularly.  Patient in for follow-up of GERD. Currently asymptomatic taking  PPI daily. There is no chest pain or heartburn. No hematemesis and no melena. No dysphagia or choking. Onset is remote. Progression is stable. Complicating factors, none.   Patient notes more cough recently.  Having to use his albuterol and this seems to increase his cough.  It did not improve with the switch from lisinopril to the R medication valsartan.  He is very fatigued, no energy.  Additionally he complains of lack of energy.  He is having sharp pain in the left forehead several times a day.  He had neurosurgery for hydrocephalus a decade or more ago.  He says this feels like it did just before his diagnosis.  He has seen a Dr. Winfield Cunas in the past a neurosurgeon at [facility].  He is concerned that his hydrocephalus may be reforming.  His daughter tells me they did not place a shunt. History Ikaika has a past medical history of Allergy, Anemia, Asthma, B12 deficiency, Cerebral ventriculomegaly, Gait abnormality, and Hypertension.   He has a past surgical history that includes Lumbar drain implantation.   His family history includes Asthma in his brother; Diabetes in his father and mother; Hypertension in his father and mother.He reports that he quit smoking about 45 years ago. His smoking use included cigarettes. He started smoking about 51 years ago. He smoked 0.50 packs per day. He has never used smokeless tobacco. He reports current alcohol use. He reports that he does not use drugs.  Current Outpatient  Medications on File Prior to Visit  Medication Sig Dispense Refill  . albuterol (PROVENTIL HFA;VENTOLIN HFA) 108 (90 Base) MCG/ACT inhaler TAKE 2 PUFFS BY MOUTH EVERY 6 HOURS AS NEEDED FOR WHEEZE OR SHORTNESS OF BREATH 18 Inhaler 4  . aspirin 81 MG tablet Take 1 tablet by mouth daily.    . cetirizine (ZYRTEC) 10 MG tablet Take 1 tablet (10 mg total) by mouth daily. 90 tablet 3  . cyanocobalamin (,VITAMIN B-12,) 1000 MCG/ML injection INJECT 1 ML INTRAMUSCULARLY EVERY 30 DAYS 3 mL 1  . DHA-EPA-VITAMIN E PO Take 1 tablet by mouth daily.    [name] Sodium 100 MG capsule Take 1 tablet by mouth at bedtime.    . fexofenadine (ALLEGRA) 180 MG tablet TAKE 1 TABLET BY MOUTH EVERY DAY 90 tablet 0  . fluticasone (FLONASE) 50 MCG/ACT nasal spray SPRAY 2 SPRAYS INTO EACH NOSTRIL EVERY DAY 48 g 1  . folic acid (FOLVITE) 1 MG tablet Take 1 tablet by mouth daily.    [name] ibuprofen (ADVIL,MOTRIN) 400 MG tablet Take 1 tablet by mouth 2 (two) times daily as needed.     No current facility-administered medications on file prior to visit.     ROS Review of Systems  Constitutional: Positive for fatigue.  HENT: Negative.   Eyes: Negative for visual disturbance.  Respiratory: Positive for cough and shortness of breath.   Cardiovascular: Negative for chest pain and leg swelling.  Gastrointestinal: Negative for abdominal pain, diarrhea, nausea and vomiting.  Genitourinary: Negative for difficulty  urinating.  Musculoskeletal: Negative for arthralgias and myalgias.  Skin: Positive for wound (recent puncture wound, left arm. Tetanus vaccine has expired). Negative for rash.  Neurological: Positive for headaches.  Psychiatric/Behavioral: Negative for sleep disturbance.    Objective:  BP (!) 144/74   Pulse (!) 59   Temp 97.8 F (36.6 C) (Oral)   Ht '5\' 7"'$  (1.702 m)   Wt 187 lb (84.8 kg)   BMI 29.29 kg/m   BP Readings from Last 3 Encounters:  08/09/18 (!) 144/74  02/08/18 (!) 156/86  08/20/17 (!) 142/82     Wt Readings from Last 3 Encounters:  08/09/18 187 lb (84.8 kg)  02/08/18 178 lb 9.6 oz (81 kg)  08/20/17 178 lb 3.2 oz (80.8 kg)     Physical Exam Constitutional:      General: He is not in acute distress.    Appearance: He is well-developed.  HENT:     Head: Normocephalic and atraumatic.     Right Ear: External ear normal.     Left Ear: External ear normal.     Nose: Nose normal.  Eyes:     Conjunctiva/sclera: Conjunctivae normal.     Pupils: Pupils are equal, round, and reactive to light.  Neck:     Musculoskeletal: Normal range of motion and neck supple.  Cardiovascular:     Rate and Rhythm: Normal rate and regular rhythm.     Heart sounds: Normal heart sounds. No murmur.  Pulmonary:     Effort: Pulmonary effort is normal. No respiratory distress.     Breath sounds: Normal breath sounds. No wheezing or rales.  Abdominal:     Palpations: Abdomen is soft.     Tenderness: There is no abdominal tenderness.  Musculoskeletal: Normal range of motion.  Skin:    General: Skin is warm and dry.     Comments: Puncture wound site free of signs of infection.   Neurological:     Mental Status: He is alert and oriented to person, place, and time.     Deep Tendon Reflexes: Reflexes are normal and symmetric.  Psychiatric:        Behavior: Behavior normal.        Thought Content: Thought content normal.        Judgment: Judgment normal.       Assessment & Plan:   Jonluke was seen today for medical management of chronic issues.  Diagnoses and all orders for this visit:  Benign essential HTN -     CBC with Differential/Platelet -     CMP14+EGFR -     metoprolol tartrate (LOPRESSOR) 25 MG tablet; TAKE 1 TABLET (25 MG TOTAL) BY MOUTH DAILY.  B12 deficiency -     Vitamin B12 -     Folate  Mild intermittent asthma without complication -     montelukast (SINGULAIR) 10 MG tablet; Take 1 tablet (10 mg total) by mouth daily. -     DG Chest 2 View; Future  Fatigue,  unspecified type -     TSH  Cerebral ventriculomegaly -     Ambulatory referral to Neurosurgery  Primary stabbing headache -     Ambulatory referral to Neurosurgery  Puncture wound of left upper arm, initial encounter  Other orders -     omeprazole (PRILOSEC) 40 MG capsule; TAKE 1 CAPSULE BY MOUTH EVERY DAY -     valsartan (DIOVAN) 320 MG tablet; Take 1 tablet (320 mg total) by mouth daily. -     neomycin-polymyxin-hydrocortisone (  CORTISPORIN) OTIC solution; Place 3 drops into both ears 4 (four) times daily. -     Pneumococcal polysaccharide vaccine 23-valent greater than or equal to 2yo subcutaneous/IM -     Td : Tetanus/diphtheria >7yo Preservative  free -     fluticasone furoate-vilanterol (BREO ELLIPTA) 100-25 MCG/INH AEPB; Inhale 1 puff into the lungs daily.   Allergies as of 08/09/2018   No Known Allergies     Medication List       Accurate as of August 09, 2018 11:59 PM. Always use your most recent med list.        albuterol 108 (90 Base) MCG/ACT inhaler Commonly known as:  PROVENTIL HFA;VENTOLIN HFA TAKE 2 PUFFS BY MOUTH EVERY 6 HOURS AS NEEDED FOR WHEEZE OR SHORTNESS OF BREATH   aspirin 81 MG tablet Take 1 tablet by mouth daily.   cetirizine 10 MG tablet Commonly known as:  ZYRTEC Take 1 tablet (10 mg total) by mouth daily.   cyanocobalamin 1000 MCG/ML injection Commonly known as:  (VITAMIN B-12) INJECT 1 ML INTRAMUSCULARLY EVERY 30 DAYS   DHA-EPA-VITAMIN E PO Take 1 tablet by mouth daily.   Docusate Sodium 100 MG capsule Take 1 tablet by mouth at bedtime.   fexofenadine 180 MG tablet Commonly known as:  ALLEGRA TAKE 1 TABLET BY MOUTH EVERY DAY   fluticasone 50 MCG/ACT nasal spray Commonly known as:  FLONASE SPRAY 2 SPRAYS INTO EACH NOSTRIL EVERY DAY   fluticasone furoate-vilanterol 100-25 MCG/INH Aepb Commonly known as:  Breo Ellipta Inhale 1 puff into the lungs daily.   folic acid 1 MG tablet Commonly known as:  FOLVITE Take 1 tablet by mouth  daily.   ibuprofen 400 MG tablet Commonly known as:  ADVIL,MOTRIN Take 1 tablet by mouth 2 (two) times daily as needed.   metoprolol tartrate 25 MG tablet Commonly known as:  LOPRESSOR TAKE 1 TABLET (25 MG TOTAL) BY MOUTH DAILY.   montelukast 10 MG tablet Commonly known as:  SINGULAIR Take 1 tablet (10 mg total) by mouth daily.   neomycin-polymyxin-hydrocortisone OTIC solution Commonly known as:  CORTISPORIN Place 3 drops into both ears 4 (four) times daily.   omeprazole 40 MG capsule Commonly known as:  PRILOSEC TAKE 1 CAPSULE BY MOUTH EVERY DAY   valsartan 320 MG tablet Commonly known as:  DIOVAN Take 1 tablet (320 mg total) by mouth daily.       Meds ordered this encounter  Medications  . metoprolol tartrate (LOPRESSOR) 25 MG tablet    Sig: TAKE 1 TABLET (25 MG TOTAL) BY MOUTH DAILY.    Dispense:  90 tablet    Refill:  1  . montelukast (SINGULAIR) 10 MG tablet    Sig: Take 1 tablet (10 mg total) by mouth daily.    Dispense:  90 tablet    Refill:  1    DX Code Needed  REFILLS NEEDED.  [name] omeprazole (PRILOSEC) 40 MG capsule    Sig: TAKE 1 CAPSULE BY MOUTH EVERY DAY    Dispense:  90 capsule    Refill:  1  . valsartan (DIOVAN) 320 MG tablet    Sig: Take 1 tablet (320 mg total) by mouth daily.    Dispense:  90 tablet    Refill:  1  . neomycin-polymyxin-hydrocortisone (CORTISPORIN) OTIC solution    Sig: Place 3 drops into both ears 4 (four) times daily.    Dispense:  10 mL    Refill:  0  . fluticasone furoate-vilanterol (BREO ELLIPTA) 100-25  MCG/INH AEPB    Sig: Inhale 1 puff into the lungs daily.    Dispense:  1 each    Refill:  11      Follow-up: Return in about 6 months (around 02/09/2019), or if symptoms worsen or fail to improve.  Claretta Fraise, M.D.

## 2018-08-10 LAB — CMP14+EGFR
ALT: 95 IU/L — ABNORMAL HIGH (ref 0–44)
AST: 77 IU/L — ABNORMAL HIGH (ref 0–40)
Albumin/Globulin Ratio: 1.7 (ref 1.2–2.2)
Albumin: 4.7 g/dL (ref 3.8–4.8)
Alkaline Phosphatase: 106 IU/L (ref 39–117)
BUN/Creatinine Ratio: 15 (ref 10–24)
BUN: 14 mg/dL (ref 8–27)
Bilirubin Total: 0.5 mg/dL (ref 0.0–1.2)
CO2: 22 mmol/L (ref 20–29)
Calcium: 9.7 mg/dL (ref 8.6–10.2)
Chloride: 102 mmol/L (ref 96–106)
Creatinine, Ser: 0.96 mg/dL (ref 0.76–1.27)
GFR calc Af Amer: 94 mL/min/{1.73_m2} (ref >59)
GFR calc non Af Amer: 81 mL/min/{1.73_m2} (ref >59)
Globulin, Total: 2.7 g/dL (ref 1.5–4.5)
Glucose: 124 mg/dL — ABNORMAL HIGH (ref 65–99)
Potassium: 4.8 mmol/L (ref 3.5–5.2)
Sodium: 140 mmol/L (ref 134–144)
Total Protein: 7.4 g/dL (ref 6.0–8.5)

## 2018-08-10 LAB — CBC WITH DIFFERENTIAL/PLATELET
Basophils Absolute: 0 10*3/uL (ref 0.0–0.2)
Basos: 1 %
EOS (ABSOLUTE): 0.2 10*3/uL (ref 0.0–0.4)
Eos: 4 %
Hematocrit: 45.3 % (ref 37.5–51.0)
Hemoglobin: 15.3 g/dL (ref 13.0–17.7)
Immature Grans (Abs): 0 10*3/uL (ref 0.0–0.1)
Immature Granulocytes: 0 %
Lymphocytes Absolute: 1.7 10*3/uL (ref 0.7–3.1)
Lymphs: 33 %
MCH: 31.9 pg (ref 26.6–33.0)
MCHC: 33.8 g/dL (ref 31.5–35.7)
MCV: 94 fL (ref 79–97)
Monocytes Absolute: 0.5 10*3/uL (ref 0.1–0.9)
Monocytes: 9 %
Neutrophils Absolute: 2.7 10*3/uL (ref 1.4–7.0)
Neutrophils: 53 %
Platelets: 211 10*3/uL (ref 150–450)
RBC: 4.8 x10E6/uL (ref 4.14–5.80)
RDW: 11.8 % (ref 11.6–15.4)
WBC: 5.1 10*3/uL (ref 3.4–10.8)

## 2018-08-10 LAB — VITAMIN B12: Vitamin B-12: 1558 pg/mL — ABNORMAL HIGH (ref 232–1245)

## 2018-08-10 LAB — FOLATE: Folate: >20 ng/mL (ref >3.0)

## 2018-08-10 LAB — TSH: TSH: 3.49 u[IU]/mL (ref 0.450–4.500)

## 2018-08-19 ENCOUNTER — Other Ambulatory Visit: Payer: Self-pay | Admitting: Family Medicine

## 2018-08-19 ENCOUNTER — Telehealth: Payer: Self-pay

## 2018-08-19 MED ORDER — CIPROFLOXACIN-HYDROCORTISONE 0.2-1 % OT SUSP: 3.0000 [drp] | Freq: Two times a day (BID) | OTIC | 0 refills | Status: DC

## 2018-08-19 MED ORDER — BUDESONIDE-FORMOTEROL FUMARATE 160-4.5 MCG/ACT IN AERO: 2.0000 | INHALATION_SPRAY | Freq: Two times a day (BID) | RESPIRATORY_TRACT | 5 refills | Status: DC

## 2018-08-19 NOTE — Telephone Encounter (Signed)
Can you change the Breo inahler and Neomycin ear drops  They are more than $100.00 each

## 2018-08-19 NOTE — Telephone Encounter (Signed)
I sent in substitutes. Neomycin is usally the cheapest drop for ears. Hopefully the newer one is better covered, but I can't be sure until he check with the pharmacy.

## 2018-08-22 DIAGNOSIS — R69 Illness, unspecified: Secondary | ICD-10-CM | POA: Diagnosis not present

## 2018-08-28 ENCOUNTER — Other Ambulatory Visit: Payer: Self-pay | Admitting: Family Medicine

## 2018-10-07 ENCOUNTER — Other Ambulatory Visit: Payer: Self-pay | Admitting: Family Medicine

## 2018-10-24 ENCOUNTER — Telehealth: Payer: Self-pay | Admitting: Family Medicine

## 2018-10-24 ENCOUNTER — Other Ambulatory Visit: Payer: Self-pay | Admitting: Family Medicine

## 2018-10-24 MED ORDER — FLUTICASONE-SALMETEROL 250-50 MCG/DOSE IN AEPB: 1.0000 | INHALATION_SPRAY | Freq: Two times a day (BID) | RESPIRATORY_TRACT | 3 refills | Status: DC

## 2018-10-24 MED ORDER — BUDESONIDE-FORMOTEROL FUMARATE 160-4.5 MCG/ACT IN AERO: 2.0000 | INHALATION_SPRAY | Freq: Two times a day (BID) | RESPIRATORY_TRACT | 5 refills | Status: DC

## 2018-10-24 NOTE — Addendum Note (Signed)
Addended by: Zannie Cove on: 10/24/2018 01:20 PM   Modules accepted: Orders

## 2018-10-24 NOTE — Telephone Encounter (Signed)
Please advise 

## 2018-10-24 NOTE — Telephone Encounter (Signed)
Pt is not able to afford breo has been using albuterol but not working as good wants to know if dr stacks could prescribe something close to breo that they can afford    [place]

## 2018-10-24 NOTE — Telephone Encounter (Signed)
Please contact the patient I can send in Advair if they want to check its price. Albuterol is okay, but not ideal.

## 2018-10-24 NOTE — Telephone Encounter (Signed)
advair was sent - per another encounter / wife aware

## 2018-10-24 NOTE — Telephone Encounter (Signed)
Try the symbicort again. Hopefully cheaper than breo

## 2018-10-24 NOTE — Telephone Encounter (Signed)
Pt willing to try the Advair  - please send in to [facility] and they will price check it.  Looks to be preferred per [employer] / pt insurance.

## 2018-11-07 ENCOUNTER — Telehealth: Payer: Self-pay | Admitting: Family Medicine

## 2018-11-07 NOTE — Telephone Encounter (Signed)
Please contact the patient He should still have albuterol available. Use it as needed. DC the advair

## 2018-11-07 NOTE — Telephone Encounter (Signed)
Pt notified of recommendation Verbalizes understanding 

## 2018-11-16 ENCOUNTER — Ambulatory Visit (INDEPENDENT_AMBULATORY_CARE_PROVIDER_SITE_OTHER): Payer: Medicare HMO | Admitting: Family Medicine

## 2018-11-16 DIAGNOSIS — J4520 Mild intermittent asthma, uncomplicated: Secondary | ICD-10-CM

## 2018-11-16 MED ORDER — FLUTICASONE FUROATE-VILANTEROL 100-25 MCG/INH IN AEPB: 1.0000 | INHALATION_SPRAY | Freq: Every day | RESPIRATORY_TRACT | 11 refills | Status: DC

## 2018-11-16 NOTE — Progress Notes (Signed)
    Subjective:    Patient ID: Jimmy Park, male    DOB: November 18, 1950, 68 y.o.   MRN: [id number]   HPI: Jimmy Park is a 68 y.o. male presenting for cough. REacted to advair.Advair DCed and it got much better. Cough decreased to minimal at night.  Relied on albuterol during that time, but didn't get dyspneic.Mouth sores reduced as well. Still no dyspnea.   Depression screen [facility] 2/9 08/09/2018 02/08/2018 08/20/2017 08/06/2017 06/19/2017  Decreased Interest 0 0 0 0 0  Down, Depressed, Hopeless 0 0 0 0 0  PHQ - 2 Score 0 0 0 0 0     Relevant past medical, surgical, family and social history reviewed and updated as indicated.  Interim medical history since our last visit reviewed. Allergies and medications reviewed and updated.  ROS:  Review of Systems  Constitutional: Negative for fever.  Respiratory: Negative for shortness of breath.   Cardiovascular: Negative for chest pain.  Musculoskeletal: Negative for arthralgias.  Skin: Negative for rash.     Social History   Tobacco Use  Smoking Status Former Smoker  . Packs/day: 0.50  . Types: Cigarettes  . Start date: 06/09/1967  . Quit date: 06/08/1973  . Years since quitting: 45.4  Smokeless Tobacco Never Used       Objective:     Wt Readings from Last 3 Encounters:  08/09/18 187 lb (84.8 kg)  02/08/18 178 lb 9.6 oz (81 kg)  08/20/17 178 lb 3.2 oz (80.8 kg)     Exam deferred. Pt. Harboring due to COVID 19. Phone visit performed.   Assessment & Plan:   1. Mild intermittent asthma without complication     Meds ordered this encounter  Medications  . fluticasone furoate-vilanterol (BREO ELLIPTA) 100-25 MCG/INH AEPB    Sig: Inhale 1 puff into the lungs daily.    Dispense:  1 each    Refill:  11    Use the Breo daily, and the albuterol only for rescue.    Diagnoses and all orders for this visit:  Mild intermittent asthma without complication  Other orders -     fluticasone furoate-vilanterol (BREO ELLIPTA) 100-25  MCG/INH AEPB; Inhale 1 puff into the lungs daily.    Virtual Visit via telephone Note  I discussed the limitations, risks, security and privacy concerns of performing an evaluation and management service by telephone and the availability of in person appointments. The patient was identified with two identifiers. Pt.expressed understanding and agreed to proceed. Pt. Is at home. Dr. Livia Snellen is in his office.  Follow Up Instructions:   I discussed the assessment and treatment plan with the patient. The patient was provided an opportunity to ask questions and all were answered. The patient agreed with the plan and demonstrated an understanding of the instructions.   The patient was advised to call back or seek an in-person evaluation if the symptoms worsen or if the condition fails to improve as anticipated.   Total minutes including chart review and phone contact time: 15   Follow up plan: As previously arranged and as needed if sx worsen  Claretta Fraise, MD [place]

## 2018-11-21 ENCOUNTER — Encounter: Payer: Self-pay | Admitting: Family Medicine

## 2018-12-20 DIAGNOSIS — L570 Actinic keratosis: Secondary | ICD-10-CM | POA: Diagnosis not present

## 2018-12-20 DIAGNOSIS — Z85828 Personal history of other malignant neoplasm of skin: Secondary | ICD-10-CM | POA: Diagnosis not present

## 2018-12-20 DIAGNOSIS — L819 Disorder of pigmentation, unspecified: Secondary | ICD-10-CM | POA: Diagnosis not present

## 2019-01-14 ENCOUNTER — Other Ambulatory Visit: Payer: Self-pay | Admitting: Nurse Practitioner

## 2019-01-14 DIAGNOSIS — J069 Acute upper respiratory infection, unspecified: Secondary | ICD-10-CM

## 2019-01-22 ENCOUNTER — Other Ambulatory Visit: Payer: Self-pay | Admitting: Family Medicine

## 2019-02-02 ENCOUNTER — Other Ambulatory Visit: Payer: Self-pay | Admitting: Family Medicine

## 2019-02-02 ENCOUNTER — Telehealth: Payer: Self-pay | Admitting: Family Medicine

## 2019-02-02 MED ORDER — CIPROFLOXACIN-HYDROCORTISONE 0.2-1 % OT SUSP: 3.0000 [drp] | Freq: Two times a day (BID) | OTIC | 0 refills | Status: DC

## 2019-02-02 NOTE — Telephone Encounter (Signed)
Please advise 

## 2019-02-02 NOTE — Telephone Encounter (Signed)
I sent in the requested prescription 

## 2019-02-03 NOTE — Telephone Encounter (Signed)
Left message rx sent to pharmacy

## 2019-02-08 ENCOUNTER — Other Ambulatory Visit: Payer: Self-pay | Admitting: Family Medicine

## 2019-02-09 ENCOUNTER — Ambulatory Visit (INDEPENDENT_AMBULATORY_CARE_PROVIDER_SITE_OTHER): Payer: Medicare HMO | Admitting: Family Medicine

## 2019-02-09 ENCOUNTER — Other Ambulatory Visit: Payer: Self-pay | Admitting: Family Medicine

## 2019-02-09 ENCOUNTER — Ambulatory Visit (INDEPENDENT_AMBULATORY_CARE_PROVIDER_SITE_OTHER): Payer: Medicare HMO | Admitting: *Deleted

## 2019-02-09 DIAGNOSIS — Z0000 Encounter for general adult medical examination without abnormal findings: Secondary | ICD-10-CM | POA: Diagnosis not present

## 2019-02-09 DIAGNOSIS — H6590 Unspecified nonsuppurative otitis media, unspecified ear: Secondary | ICD-10-CM

## 2019-02-09 DIAGNOSIS — I421 Obstructive hypertrophic cardiomyopathy: Secondary | ICD-10-CM

## 2019-02-09 DIAGNOSIS — K219 Gastro-esophageal reflux disease without esophagitis: Secondary | ICD-10-CM

## 2019-02-09 DIAGNOSIS — J4520 Mild intermittent asthma, uncomplicated: Secondary | ICD-10-CM

## 2019-02-09 DIAGNOSIS — I10 Essential (primary) hypertension: Secondary | ICD-10-CM

## 2019-02-09 MED ORDER — METOPROLOL TARTRATE 25 MG PO TABS: ORAL_TABLET | ORAL | 1 refills | Status: DC

## 2019-02-09 MED ORDER — MONTELUKAST SODIUM 10 MG PO TABS: 10.0000 mg | ORAL_TABLET | Freq: Every day | ORAL | 1 refills | Status: DC

## 2019-02-09 MED ORDER — OMEPRAZOLE 40 MG PO CPDR: DELAYED_RELEASE_CAPSULE | ORAL | 1 refills | Status: DC

## 2019-02-09 MED ORDER — VALSARTAN 320 MG PO TABS: 320.0000 mg | ORAL_TABLET | Freq: Every day | ORAL | 1 refills | Status: DC

## 2019-02-09 MED ORDER — CEFUROXIME AXETIL 500 MG PO TABS: 500.0000 mg | ORAL_TABLET | Freq: Two times a day (BID) | ORAL | 0 refills | Status: AC

## 2019-02-09 NOTE — Patient Instructions (Signed)
Preventive Care 69 Years and Older, Male Preventive care refers to lifestyle choices and visits with your health care provider that can promote health and wellness. This includes:  A yearly physical exam. This is also called an annual well check.  Regular dental and eye exams.  Immunizations.  Screening for certain conditions.  Healthy lifestyle choices, such as diet and exercise. What can I expect for my preventive care visit? Physical exam Your health care provider will check:  Height and weight. These may be used to calculate body mass index (BMI), which is a measurement that tells if you are at a healthy weight.  Heart rate and blood pressure.  Your skin for abnormal spots. Counseling Your health care provider may ask you questions about:  Alcohol, tobacco, and drug use.  Emotional well-being.  Home and relationship well-being.  Sexual activity.  Eating habits.  History of falls.  Memory and ability to understand (cognition).  Work and work Statistician. What immunizations do I need?  Influenza (flu) vaccine  This is recommended every year. Tetanus, diphtheria, and pertussis (Tdap) vaccine  You may need a Td booster every 10 years. Varicella (chickenpox) vaccine  You may need this vaccine if you have not already been vaccinated. Zoster (shingles) vaccine  You may need this after age 50. Pneumococcal conjugate (PCV13) vaccine  One dose is recommended after age 24. Pneumococcal polysaccharide (PPSV23) vaccine  One dose is recommended after age 33. Measles, mumps, and rubella (MMR) vaccine  You may need at least one dose of MMR if you were born in 1957 or later. You may also need a second dose. Meningococcal conjugate (MenACWY) vaccine  You may need this if you have certain conditions. Hepatitis A vaccine  You may need this if you have certain conditions or if you travel or work in places where you may be exposed to hepatitis A. Hepatitis B vaccine   You may need this if you have certain conditions or if you travel or work in places where you may be exposed to hepatitis B. Haemophilus influenzae type b (Hib) vaccine  You may need this if you have certain conditions. You may receive vaccines as individual doses or as more than one vaccine together in one shot (combination vaccines). Talk with your health care provider about the risks and benefits of combination vaccines. What tests do I need? Blood tests  Lipid and cholesterol levels. These may be checked every 5 years, or more frequently depending on your overall health.  Hepatitis C test.  Hepatitis B test. Screening  Lung cancer screening. You may have this screening every year starting at age 69 if you have a 30-pack-year history of smoking and currently smoke or have quit within the past 15 years.  Colorectal cancer screening. All adults should have this screening starting at age 69 and continuing until age 54. Your health care provider may recommend screening at age 69 if you are at increased risk. You will have tests every 1-10 years, depending on your results and the type of screening test.  Prostate cancer screening. Recommendations will vary depending on your family history and other risks.  Diabetes screening. This is done by checking your blood sugar (glucose) after you have not eaten for a while (fasting). You may have this done every 1-3 years.  Abdominal aortic aneurysm (AAA) screening. You may need this if you are a current or former smoker.  Sexually transmitted disease (STD) testing. Follow these instructions at home: Eating and drinking  Eat  a diet that includes fresh fruits and vegetables, whole grains, lean protein, and low-fat dairy products. Limit your intake of foods with high amounts of sugar, saturated fats, and salt.  Take vitamin and mineral supplements as recommended by your health care provider.  Do not drink alcohol if your health care provider  tells you not to drink.  If you drink alcohol: ? Limit how much you have to 0-2 drinks a day. ? Be aware of how much alcohol is in your drink. In the [place] one drink equals one 12 oz bottle of beer (355 mL), one 5 oz glass of wine (148 mL), or one 1 oz glass of hard liquor (44 mL). Lifestyle  Take daily care of your teeth and gums.  Stay active. Exercise for at least 30 minutes on 5 or more days each week.  Do not use any products that contain nicotine or tobacco, such as cigarettes, e-cigarettes, and chewing tobacco. If you need help quitting, ask your health care provider.  If you are sexually active, practice safe sex. Use a condom or other form of protection to prevent STIs (sexually transmitted infections).  Talk with your health care provider about taking a low-dose aspirin or statin. What's next?  Visit your health care provider once a year for a well check visit.  Ask your health care provider how often you should have your eyes and teeth checked.  Stay up to date on all vaccines. This information is not intended to replace advice given to you by your health care provider. Make sure you discuss any questions you have with your health care provider. Document Released: 06/21/2015 Document Revised: 05/19/2018 Document Reviewed: 05/19/2018 Elsevier Patient Education  2020 [employer]  

## 2019-02-09 NOTE — Progress Notes (Signed)
Subjective:  Patient ID: Jimmy Park, male    DOB: [birth year]  Age: 68 y.o. MRN: [id number]  CC: No chief complaint on file.   HPI Dainel Overbey presents for  follow-up of hypertension. Patient has no history of headache chest pain or shortness of breath or recent cough. Patient also denies symptoms of TIA such as focal numbness or weakness. Patient denies side effects from medication. States taking it regularly. Energy could be better.   133/80 this am. Yest 134/78, 108/70 on August 28. O2 staying about 95.  Stopped breo due to sore throat Occasional DOE only Patient in for follow-up of GERD. Currently asymptomatic taking  PPI daily. There is no chest pain or heartburn. No hematemesis and no melena. No dysphagia or choking. Onset is remote. Progression is stable. Complicating factors, none.  History Miguelangel has a past medical history of Allergy, Anemia, Asthma, B12 deficiency, Cerebral ventriculomegaly, Gait abnormality, and Hypertension.   He has a past surgical history that includes Lumbar drain implantation.   His family history includes Asthma in his brother; Diabetes in his father and mother; Hypertension in his father and mother.He reports that he quit smoking about 45 years ago. His smoking use included cigarettes. He started smoking about 51 years ago. He smoked 0.50 packs per day. He has never used smokeless tobacco. He reports previous alcohol use. He reports that he does not use drugs.  Current Outpatient Medications on File Prior to Visit  Medication Sig Dispense Refill  . albuterol (PROVENTIL HFA;VENTOLIN HFA) 108 (90 Base) MCG/ACT inhaler TAKE 2 PUFFS BY MOUTH EVERY 6 HOURS AS NEEDED FOR WHEEZE OR SHORTNESS OF BREATH 18 Inhaler 4  . aspirin 81 MG tablet Take 1 tablet by mouth daily.    . cetirizine (ZYRTEC) 10 MG tablet TAKE 1 TABLET BY MOUTH EVERY DAY 90 tablet 3  . fexofenadine (ALLEGRA) 180 MG tablet TAKE 1 TABLET BY MOUTH EVERY DAY 90 tablet 1  . fluticasone (FLONASE) 50  MCG/ACT nasal spray SPRAY 2 SPRAYS INTO EACH NOSTRIL EVERY DAY 48 mL 1  . fluticasone furoate-vilanterol (BREO ELLIPTA) 100-25 MCG/INH AEPB Inhale 1 puff into the lungs daily. 1 each 11  . folic acid (FOLVITE) 1 MG tablet Take 1 tablet by mouth daily.    [name] ibuprofen (ADVIL,MOTRIN) 400 MG tablet Take 1 tablet by mouth 2 (two) times daily as needed.     No current facility-administered medications on file prior to visit.     ROS Review of Systems  Constitutional: Negative.   HENT: Positive for ear pain (left ear not relieved by neosporin drops, using for about a month. ).   Eyes: Negative for visual disturbance.  Respiratory: Positive for cough. Negative for shortness of breath.   Cardiovascular: Negative for chest pain and leg swelling.  Gastrointestinal: Negative for abdominal pain, diarrhea, nausea and vomiting.  Genitourinary: Negative for difficulty urinating.  Musculoskeletal: Negative for arthralgias and myalgias.  Skin: Negative for rash.  Neurological: Negative for headaches.  Psychiatric/Behavioral: Negative for sleep disturbance.    Objective:  There were no vitals taken for this visit.  BP Readings from Last 3 Encounters:  08/09/18 (!) 144/74  02/08/18 (!) 156/86  08/20/17 (!) 142/82    Wt Readings from Last 3 Encounters:  08/09/18 187 lb (84.8 kg)  02/08/18 178 lb 9.6 oz (81 kg)  08/20/17 178 lb 3.2 oz (80.8 kg)     Physical Exam Exam deferred. Pt. Harboring due to COVID 19. Phone visit performed.    Assessment &  Plan:   Diagnoses and all orders for this visit:  Benign essential HTN -     metoprolol tartrate (LOPRESSOR) 25 MG tablet; TAKE 1 TABLET (25 MG TOTAL) BY MOUTH DAILY.  Mild intermittent asthma without complication -     montelukast (SINGULAIR) 10 MG tablet; Take 1 tablet (10 mg total) by mouth daily.  Cardiomyopathy, hypertrophic obstructive (HCC)  Otitis media with effusion, unspecified laterality  Gastroesophageal reflux disease without  esophagitis  Other orders -     cefUROXime (CEFTIN) 500 MG tablet; Take 1 tablet (500 mg total) by mouth 2 (two) times daily with a meal for 14 days. -     omeprazole (PRILOSEC) 40 MG capsule; TAKE 1 CAPSULE BY MOUTH EVERY DAY -     valsartan (DIOVAN) 320 MG tablet; Take 1 tablet (320 mg total) by mouth daily.   Allergies as of 02/09/2019   No Known Allergies     Medication List       Accurate as of February 09, 2019 11:59 PM. If you have any questions, ask your nurse or doctor.        STOP taking these medications   ciprofloxacin-hydrocortisone OTIC suspension Commonly known as: CIPRO HC OTIC Stopped by: Claretta Fraise, MD   DHA-EPA-VITAMIN E PO Stopped by: Marylin Crosby, LPN   Docusate Sodium 100 MG capsule Stopped by: Marylin Crosby, LPN     TAKE these medications   albuterol 108 (90 Base) MCG/ACT inhaler Commonly known as: VENTOLIN HFA TAKE 2 PUFFS BY MOUTH EVERY 6 HOURS AS NEEDED FOR WHEEZE OR SHORTNESS OF BREATH   aspirin 81 MG tablet Take 1 tablet by mouth daily.   cefUROXime 500 MG tablet Commonly known as: CEFTIN Take 1 tablet (500 mg total) by mouth 2 (two) times daily with a meal for 14 days. Started by: Claretta Fraise, MD   cetirizine 10 MG tablet Commonly known as: ZYRTEC TAKE 1 TABLET BY MOUTH EVERY DAY   cyanocobalamin 1000 MCG/ML injection Commonly known as: (VITAMIN B-12) INJECT 1 ML INTRAMUSCULARLY EVERY 30 DAYS   fexofenadine 180 MG tablet Commonly known as: ALLEGRA TAKE 1 TABLET BY MOUTH EVERY DAY   Fish Oil 1000 MG Caps Take by mouth.   fluticasone 50 MCG/ACT nasal spray Commonly known as: FLONASE SPRAY 2 SPRAYS INTO EACH NOSTRIL EVERY DAY   fluticasone furoate-vilanterol 100-25 MCG/INH Aepb Commonly known as: Breo Ellipta Inhale 1 puff into the lungs daily.   folic acid 1 MG tablet Commonly known as: FOLVITE Take 1 tablet by mouth daily.   ibuprofen 400 MG tablet Commonly known as: ADVIL Take 1 tablet by mouth 2 (two)  times daily as needed.   metoprolol tartrate 25 MG tablet Commonly known as: LOPRESSOR TAKE 1 TABLET (25 MG TOTAL) BY MOUTH DAILY.   montelukast 10 MG tablet Commonly known as: SINGULAIR Take 1 tablet (10 mg total) by mouth daily.   omeprazole 40 MG capsule Commonly known as: PRILOSEC TAKE 1 CAPSULE BY MOUTH EVERY DAY   valsartan 320 MG tablet Commonly known as: DIOVAN Take 1 tablet (320 mg total) by mouth daily.       Meds ordered this encounter  Medications  . cefUROXime (CEFTIN) 500 MG tablet    Sig: Take 1 tablet (500 mg total) by mouth 2 (two) times daily with a meal for 14 days.    Dispense:  28 tablet    Refill:  0  . metoprolol tartrate (LOPRESSOR) 25 MG tablet    Sig: TAKE 1  TABLET (25 MG TOTAL) BY MOUTH DAILY.    Dispense:  90 tablet    Refill:  1  . montelukast (SINGULAIR) 10 MG tablet    Sig: Take 1 tablet (10 mg total) by mouth daily.    Dispense:  90 tablet    Refill:  1    DX Code Needed  REFILLS NEEDED.  [name] omeprazole (PRILOSEC) 40 MG capsule    Sig: TAKE 1 CAPSULE BY MOUTH EVERY DAY    Dispense:  90 capsule    Refill:  1  . valsartan (DIOVAN) 320 MG tablet    Sig: Take 1 tablet (320 mg total) by mouth daily.    Dispense:  90 tablet    Refill:  1    Virtual Visit via telephone Note  I discussed the limitations, risks, security and privacy concerns of performing an evaluation and management service by telephone and the availability of in person appointments. I also discussed with the patient that there may be a patient responsible charge related to this service. The patient expressed understanding and agreed to proceed. Pt. Is at home. Dr. Livia Snellen is in his office.  Follow Up Instructions:   I discussed the assessment and treatment plan with the patient. The patient was provided an opportunity to ask questions and all were answered. The patient agreed with the plan and demonstrated an understanding of the instructions.   The patient was advised to  call back or seek an in-person evaluation if the symptoms worsen or if the condition fails to improve as anticipated.  Total minutes including chart review and phone contact time: 21   Follow-up: Return in about 6 months (around 08/09/2019), or if symptoms worsen or fail to improve.  Claretta Fraise, M.D.

## 2019-02-09 NOTE — Progress Notes (Signed)
MEDICARE ANNUAL WELLNESS VISIT  02/09/2019  Telephone Visit Disclaimer This Medicare AWV was conducted by telephone due to national recommendations for restrictions regarding the COVID-19 Pandemic (e.g. social distancing).  I verified, using two identifiers, that I am speaking with Jimmy Park or their authorized healthcare agent. I discussed the limitations, risks, security, and privacy concerns of performing an evaluation and management service by telephone and the potential availability of an in-person appointment in the future. The patient expressed understanding and agreed to proceed.   Subjective:  Jimmy Park is a 68 y.o. male patient of St[facility], [name], MD who had a Medicare Annual Wellness Visit today via telephone. Jimmy Park is Retired and lives with their spouse and stepson. he has 2 biologic children and 2 step-children. he reports that he is socially active and does interact with friends/family regularly. he is not physically active and enjoys doing word search puzzles and reading the newspaper.  Patient Care Team: Claretta Fraise, MD as PCP - General (Family Medicine)  Advanced Directives 02/09/2019 01/09/2015  Does Patient Have a Medical Advance Directive? Yes Yes  Type of Paramedic of [place];Living will -  Does patient want to make changes to medical advance directive? No - Patient declined -  Copy of [place] in Chart? No - copy requested [facility] Utilization Over the Past 12 Months: # of hospitalizations or ER visits: 0 # of surgeries: 0  Review of Systems    Patient reports that his overall health is unchanged compared to last year.  History obtained from chart review  Patient Reported Readings (BP, Pulse, CBG, Weight, etc) none  Pain Assessment Pain : No/denies pain     Current Medications & Allergies (verified) Allergies as of 02/09/2019   No Known Allergies     Medication List       Accurate as of  February 09, 2019 11:07 AM. If you have any questions, ask your nurse or doctor.        STOP taking these medications   ciprofloxacin-hydrocortisone OTIC suspension Commonly known as: CIPRO HC OTIC Stopped by: Claretta Fraise, MD   DHA-EPA-VITAMIN E PO   Docusate Sodium 100 MG capsule     TAKE these medications   albuterol 108 (90 Base) MCG/ACT inhaler Commonly known as: VENTOLIN HFA TAKE 2 PUFFS BY MOUTH EVERY 6 HOURS AS NEEDED FOR WHEEZE OR SHORTNESS OF BREATH   aspirin 81 MG tablet Take 1 tablet by mouth daily.   cefUROXime 500 MG tablet Commonly known as: CEFTIN Take 1 tablet (500 mg total) by mouth 2 (two) times daily with a meal for 14 days. Started by: Claretta Fraise, MD   cetirizine 10 MG tablet Commonly known as: ZYRTEC TAKE 1 TABLET BY MOUTH EVERY DAY   cyanocobalamin 1000 MCG/ML injection Commonly known as: (VITAMIN B-12) INJECT 1 ML INTRAMUSCULARLY EVERY 30 DAYS   fexofenadine 180 MG tablet Commonly known as: ALLEGRA TAKE 1 TABLET BY MOUTH EVERY DAY   Fish Oil 1000 MG Caps Take by mouth.   fluticasone 50 MCG/ACT nasal spray Commonly known as: FLONASE SPRAY 2 SPRAYS INTO EACH NOSTRIL EVERY DAY   fluticasone furoate-vilanterol 100-25 MCG/INH Aepb Commonly known as: Breo Ellipta Inhale 1 puff into the lungs daily.   folic acid 1 MG tablet Commonly known as: FOLVITE Take 1 tablet by mouth daily.   ibuprofen 400 MG tablet Commonly known as: ADVIL Take 1 tablet by mouth 2 (two) times daily as needed.  metoprolol tartrate 25 MG tablet Commonly known as: LOPRESSOR TAKE 1 TABLET (25 MG TOTAL) BY MOUTH DAILY.   montelukast 10 MG tablet Commonly known as: SINGULAIR Take 1 tablet (10 mg total) by mouth daily.   omeprazole 40 MG capsule Commonly known as: PRILOSEC TAKE 1 CAPSULE BY MOUTH EVERY DAY   valsartan 320 MG tablet Commonly known as: DIOVAN Take 1 tablet (320 mg total) by mouth daily.       History (reviewed): Past Medical History:   Diagnosis Date  . Allergy   . Anemia   . Asthma   . B12 deficiency   . Cerebral ventriculomegaly   . Gait abnormality   . Hypertension    Past Surgical History:  Procedure Laterality Date  . LUMBAR DRAIN IMPLANTATION     Family History  Problem Relation Age of Onset  . Diabetes Mother   . Hypertension Mother   . Diabetes Father   . Hypertension Father   . Asthma Brother    Social History   Socioeconomic History  . Marital status: Married    Spouse name: [name]  . Number of children: 4  . Years of education: 85  . Highest education level: High school graduate  Occupational History  . Occupation: retired  Scientific laboratory technician  . Financial resource strain: Not hard at all  . Food insecurity    Worry: Never true    Inability: Never true  . Transportation needs    Medical: No    Non-medical: No  Tobacco Use  . Smoking status: Former Smoker    Packs/day: 0.50    Types: Cigarettes    Start date: 06/09/1967    Quit date: 06/08/1973    Years since quitting: 45.7  . Smokeless tobacco: Never Used  Substance and Sexual Activity  . Alcohol use: Not Currently    Alcohol/week: 0.0 standard drinks  . Drug use: No  . Sexual activity: Not Currently  Lifestyle  . Physical activity    Days per week: 0 days    Minutes per session: 0 min  . Stress: Only a little  Relationships  . Social Herbalist on phone: Twice a week    Gets together: Twice a week    Attends religious service: 1 to 4 times per year    Active member of club or organization: No    Attends meetings of clubs or organizations: Never    Relationship status: Married  Other Topics Concern  . Not on file  Social History Narrative  . Not on file    Activities of Daily Living In your present state of health, do you have any difficulty performing the following activities: 02/09/2019  Hearing? Y  Comment hard of hearing-rx'd hearing aids but pt is unable to wear them due to increased pain while wearing them   Vision? N  Comment wears glasses and gets yearly eye exam  Difficulty concentrating or making decisions? Y  Comment has noticed as he gets older his memory isn't as good as it used to be  Walking or climbing stairs? Y  Comment due to right foot and having to use a cane  Dressing or bathing? N  Doing errands, shopping? Y  Comment wife takes him to all appointments and to run all errands  Preparing Food and eating ? N  Using the Toilet? N  In the past six months, have you accidently leaked urine? N  Do you have problems with loss of bowel control? N  Managing your Medications? N  Managing your Finances? N  Housekeeping or managing your Housekeeping? Y  Comment stepson does most of the housework  Some recent data might be hidden    Patient Education/ Literacy How often do you need to have someone help you when you read instructions, pamphlets, or other written materials from your doctor or pharmacy?: 1 - Never What is the last grade level you completed in school?: 12th grade  Exercise Current Exercise Habits: The patient does not participate in regular exercise at present, Exercise limited by: orthopedic condition(s);neurologic condition(s);respiratory conditions(s)  Diet Patient reports consuming 3 meals a day and 1 snack(s) a day Patient reports that his primary diet is: Regular Patient reports that she does have regular access to food.   Depression Screen PHQ 2/9 Scores 02/09/2019 08/09/2018 02/08/2018 08/20/2017 08/06/2017 06/19/2017 02/03/2017  PHQ - 2 Score 0 0 0 0 0 0 0     Fall Risk Fall Risk  02/09/2019 08/09/2018 02/08/2018 08/20/2017 08/06/2017  Falls in the past year? 0 0 Yes Yes No  Number falls in past yr: 0 - 1 1 -  Injury with Fall? 0 - Yes Yes -  Comment - - - Hip pain. -  Risk for fall due to : Impaired balance/gait;Impaired mobility - History of fall(s);Impaired balance/gait - -  Follow up Falls prevention discussed - - - -  Comment get rid of all throw rugs in the house,  adequate lighting in walkways and grab bars in the bathroom - - - -     Objective:  Jimmy Park seemed alert and oriented and he participated appropriately during our telephone visit.  Blood Pressure Weight BMI  BP Readings from Last 3 Encounters:  08/09/18 (!) 144/74  02/08/18 (!) 156/86  08/20/17 (!) 142/82   Wt Readings from Last 3 Encounters:  08/09/18 187 lb (84.8 kg)  02/08/18 178 lb 9.6 oz (81 kg)  08/20/17 178 lb 3.2 oz (80.8 kg)   BMI Readings from Last 1 Encounters:  08/09/18 29.29 kg/m    *Unable to obtain current vital signs, weight, and BMI due to telephone visit type  Hearing/Vision  . Dacen did not seem to have difficulty with hearing/understanding during the telephone conversation . Reports that he has had a formal eye exam by an eye care professional within the past year . Reports that he has not had a formal hearing evaluation within the past year *Unable to fully assess hearing and vision during telephone visit type  Cognitive Function: 6CIT Screen 02/09/2019  What Year? 0 points  What month? 0 points  What time? 0 points  Count back from 20 0 points  Months in reverse 0 points  Repeat phrase 4 points  Total Score 4   (Normal:0-7, Significant for Dysfunction: >8)  Normal Cognitive Function Screening: Yes   Immunization & Health Maintenance Record Immunization History  Administered Date(s) Administered  . DTaP 09/18/2007  . Influenza Whole 05/14/2010, 07/01/2011, 02/15/2012, 03/08/2013, 02/26/2014  . Influenza, High Dose Seasonal PF 03/17/2018  . Influenza,inj,Quad PF,6+ Mos 03/27/2015, 02/26/2016  . Influenza-Unspecified 03/17/2017  . Pneumococcal Conjugate-13 08/06/2017  . Pneumococcal Polysaccharide-23 08/09/2018  . Pneumococcal-Unspecified 02/15/2012  . Td 08/09/2018  . Tdap 09/18/2007    Health Maintenance  Topic Date Due  . Hepatitis C Screening  25-Aug-1950  . INFLUENZA VACCINE  01/07/2019  . COLONOSCOPY  05/08/2024  . TETANUS/TDAP   08/08/2028  . PNA vac Low Risk Adult  Completed       Assessment  This is a routine wellness examination for Jimmy Park.  Health Maintenance: Due or Overdue Health Maintenance Due  Topic Date Due  . Hepatitis C Screening  1950/08/05  . INFLUENZA VACCINE  01/07/2019    Jimmy Park does not need a referral for Community Assistance: Care Management:   no Social Work:    no Prescription Assistance:  no Nutrition/Diabetes Education:  no   Plan:  Personalized Goals Goals Addressed            This Visit's Progress   . DIET - INCREASE WATER INTAKE       Try to drink 6-8 glasses of water daily.      Personalized Health Maintenance & Screening Recommendations  Influenza vaccine  Lung Cancer Screening Recommended: no (Low Dose CT Chest recommended if Age 55-80 years, 30 pack-year currently smoking OR have quit w/in past 15 years) Hepatitis C Screening recommended: yes HIV Screening recommended: no  Advanced Directives: Written information was not prepared per patient's request.  Referrals & Orders No orders of the defined types were placed in this encounter.   Follow-up Plan . Follow-up with Claretta Fraise, MD as planned . Bring a copy of your Advanced Directives for our records . Get your Flu vaccine at your next visit with your PCP   I have personally reviewed and noted the following in the patient's chart:   . Medical and social history . Use of alcohol, tobacco or illicit drugs  . Current medications and supplements . Functional ability and status . Nutritional status . Physical activity . Advanced directives . List of other physicians . Hospitalizations, surgeries, and ER visits in previous 12 months . Vitals . Screenings to include cognitive, depression, and falls . Referrals and appointments  In addition, I have reviewed and discussed with Jimmy Park certain preventive protocols, quality metrics, and best practice recommendations. A written  personalized care plan for preventive services as well as general preventive health recommendations is available and can be mailed to the patient at his request.      Marylin Crosby, LPN  [phone]

## 2019-02-10 ENCOUNTER — Encounter: Payer: Self-pay | Admitting: Family Medicine

## 2019-02-23 DIAGNOSIS — H40013 Open angle with borderline findings, low risk, bilateral: Secondary | ICD-10-CM | POA: Diagnosis not present

## 2019-02-23 DIAGNOSIS — H524 Presbyopia: Secondary | ICD-10-CM | POA: Diagnosis not present

## 2019-03-02 DIAGNOSIS — R69 Illness, unspecified: Secondary | ICD-10-CM | POA: Diagnosis not present

## 2019-03-07 ENCOUNTER — Other Ambulatory Visit: Payer: Self-pay

## 2019-03-08 ENCOUNTER — Ambulatory Visit (INDEPENDENT_AMBULATORY_CARE_PROVIDER_SITE_OTHER): Payer: Medicare HMO

## 2019-03-08 DIAGNOSIS — Z23 Encounter for immunization: Secondary | ICD-10-CM | POA: Diagnosis not present

## 2019-04-01 ENCOUNTER — Other Ambulatory Visit: Payer: Self-pay | Admitting: Family Medicine

## 2019-04-03 ENCOUNTER — Other Ambulatory Visit: Payer: Self-pay | Admitting: Family Medicine

## 2019-05-18 ENCOUNTER — Encounter: Payer: Self-pay | Admitting: Family Medicine

## 2019-05-18 ENCOUNTER — Ambulatory Visit (INDEPENDENT_AMBULATORY_CARE_PROVIDER_SITE_OTHER): Payer: Medicare HMO | Admitting: Family Medicine

## 2019-05-18 ENCOUNTER — Other Ambulatory Visit: Payer: Self-pay

## 2019-05-18 DIAGNOSIS — J40 Bronchitis, not specified as acute or chronic: Secondary | ICD-10-CM | POA: Diagnosis not present

## 2019-05-18 DIAGNOSIS — J329 Chronic sinusitis, unspecified: Secondary | ICD-10-CM | POA: Diagnosis not present

## 2019-05-18 MED ORDER — LEVOFLOXACIN 500 MG PO TABS: 500.0000 mg | ORAL_TABLET | Freq: Every day | ORAL | 0 refills | Status: DC

## 2019-05-18 NOTE — Progress Notes (Signed)
    Subjective:    Patient ID: Jimmy Park, male    DOB: 12-13-1950, 68 y.o.   MRN: [id number]   HPI: Jimmy Park is a 68 y.o. male presenting for Symptoms include congestion, ears hurt,  facial pain, nasal congestion, non productive cough, post nasal drip and sinus pressure. There is no fever, chills, or sweats. Onset of symptoms was a week ago, gradually worsening since that time. A little dyspneic when he goes out in the cold.    Depression screen [facility] 2/9 02/09/2019 08/09/2018 02/08/2018 08/20/2017 08/06/2017  Decreased Interest 0 0 0 0 0  Down, Depressed, Hopeless 0 0 0 0 0  PHQ - 2 Score 0 0 0 0 0     Relevant past medical, surgical, family and social history reviewed and updated as indicated.  Interim medical history since our last visit reviewed. Allergies and medications reviewed and updated.  ROS:  Review of Systems  Constitutional: Negative for activity change, appetite change, chills and fever.  HENT: Positive for congestion, postnasal drip, rhinorrhea and sinus pressure. Negative for ear discharge, ear pain, hearing loss, nosebleeds, sneezing and trouble swallowing.   Respiratory: Positive for cough and shortness of breath. Negative for chest tightness and wheezing.   Cardiovascular: Negative for chest pain and palpitations.  Skin: Negative for rash.     Social History   Tobacco Use  Smoking Status Former Smoker  . Packs/day: 0.50  . Types: Cigarettes  . Start date: 06/09/1967  . Quit date: 06/08/1973  . Years since quitting: 45.9  Smokeless Tobacco Never Used       Objective:     Wt Readings from Last 3 Encounters:  08/09/18 187 lb (84.8 kg)  02/08/18 178 lb 9.6 oz (81 kg)  08/20/17 178 lb 3.2 oz (80.8 kg)     Exam deferred. Pt. Harboring due to COVID 19. Phone visit performed.   Assessment & Plan:   1. Sinobronchitis     Meds ordered this encounter  Medications  . levofloxacin (LEVAQUIN) 500 MG tablet    Sig: Take 1 tablet (500 mg total) by mouth  daily. For 10 days    Dispense:  10 tablet    Refill:  0    No orders of the defined types were placed in this encounter.     Diagnoses and all orders for this visit:  Sinobronchitis  Other orders -     levofloxacin (LEVAQUIN) 500 MG tablet; Take 1 tablet (500 mg total) by mouth daily. For 10 days    Virtual Visit via telephone Note  I discussed the limitations, risks, security and privacy concerns of performing an evaluation and management service by telephone and the availability of in person appointments. The patient was identified with two identifiers. Pt.expressed understanding and agreed to proceed. Pt. Is at home. Dr. Livia Snellen is in his office.  Follow Up Instructions:   I discussed the assessment and treatment plan with the patient. The patient was provided an opportunity to ask questions and all were answered. The patient agreed with the plan and demonstrated an understanding of the instructions.   The patient was advised to call back or seek an in-person evaluation if the symptoms worsen or if the condition fails to improve as anticipated.   Total minutes including chart review and phone contact time: 8   Follow up plan: Return if symptoms worsen or fail to improve.  Claretta Fraise, MD [place]

## 2019-07-09 ENCOUNTER — Other Ambulatory Visit: Payer: Self-pay | Admitting: Family Medicine

## 2019-08-03 ENCOUNTER — Other Ambulatory Visit: Payer: Self-pay | Admitting: Family Medicine

## 2019-08-22 ENCOUNTER — Other Ambulatory Visit: Payer: Self-pay | Admitting: Family Medicine

## 2019-08-22 DIAGNOSIS — I10 Essential (primary) hypertension: Secondary | ICD-10-CM

## 2019-09-14 DIAGNOSIS — R69 Illness, unspecified: Secondary | ICD-10-CM | POA: Diagnosis not present

## 2019-10-01 ENCOUNTER — Other Ambulatory Visit: Payer: Self-pay | Admitting: Family Medicine

## 2019-10-01 DIAGNOSIS — I10 Essential (primary) hypertension: Secondary | ICD-10-CM

## 2019-10-02 ENCOUNTER — Telehealth: Payer: Self-pay | Admitting: Family Medicine

## 2019-10-02 NOTE — Telephone Encounter (Signed)
LM to call back about meds

## 2019-10-02 NOTE — Telephone Encounter (Signed)
Lm 4/26-jhb NO fax yet from pharm or plan needing PA

## 2019-10-02 NOTE — Telephone Encounter (Signed)
Pt called back and appt made

## 2019-10-02 NOTE — Telephone Encounter (Signed)
[facility] NTBS 30 days given 09/05/19

## 2019-10-04 DIAGNOSIS — R69 Illness, unspecified: Secondary | ICD-10-CM | POA: Diagnosis not present

## 2019-10-07 ENCOUNTER — Other Ambulatory Visit: Payer: Self-pay | Admitting: Family Medicine

## 2019-10-07 DIAGNOSIS — J069 Acute upper respiratory infection, unspecified: Secondary | ICD-10-CM

## 2019-10-09 ENCOUNTER — Encounter: Payer: Self-pay | Admitting: Family Medicine

## 2019-10-09 ENCOUNTER — Ambulatory Visit (INDEPENDENT_AMBULATORY_CARE_PROVIDER_SITE_OTHER): Payer: Medicare HMO | Admitting: Family Medicine

## 2019-10-09 DIAGNOSIS — E538 Deficiency of other specified B group vitamins: Secondary | ICD-10-CM | POA: Diagnosis not present

## 2019-10-09 DIAGNOSIS — J309 Allergic rhinitis, unspecified: Secondary | ICD-10-CM

## 2019-10-09 DIAGNOSIS — J4520 Mild intermittent asthma, uncomplicated: Secondary | ICD-10-CM

## 2019-10-09 DIAGNOSIS — K219 Gastro-esophageal reflux disease without esophagitis: Secondary | ICD-10-CM | POA: Insufficient documentation

## 2019-10-09 DIAGNOSIS — I10 Essential (primary) hypertension: Secondary | ICD-10-CM

## 2019-10-09 MED ORDER — FEXOFENADINE HCL 180 MG PO TABS: 180.0000 mg | ORAL_TABLET | Freq: Every day | ORAL | 1 refills | Status: DC

## 2019-10-09 MED ORDER — VALSARTAN 320 MG PO TABS: 320.0000 mg | ORAL_TABLET | Freq: Every day | ORAL | 1 refills | Status: DC

## 2019-10-09 MED ORDER — OMEPRAZOLE 40 MG PO CPDR: 40.0000 mg | DELAYED_RELEASE_CAPSULE | Freq: Every day | ORAL | 1 refills | Status: DC

## 2019-10-09 MED ORDER — ALBUTEROL SULFATE HFA 108 (90 BASE) MCG/ACT IN AERS: INHALATION_SPRAY | RESPIRATORY_TRACT | 3 refills | Status: DC

## 2019-10-09 MED ORDER — CYANOCOBALAMIN 1000 MCG/ML IJ SOLN: 1000.0000 ug | INTRAMUSCULAR | 11 refills | Status: DC

## 2019-10-09 MED ORDER — METOPROLOL TARTRATE 25 MG PO TABS: ORAL_TABLET | ORAL | 1 refills | Status: DC

## 2019-10-09 MED ORDER — MONTELUKAST SODIUM 10 MG PO TABS: 10.0000 mg | ORAL_TABLET | Freq: Every day | ORAL | 1 refills | Status: DC

## 2019-10-09 MED ORDER — FLUTICASONE-SALMETEROL 250-50 MCG/DOSE IN AEPB: 1.0000 | INHALATION_SPRAY | Freq: Two times a day (BID) | RESPIRATORY_TRACT | 11 refills | Status: DC

## 2019-10-09 NOTE — Progress Notes (Signed)
Subjective:    Patient ID: Jimmy Park, male    DOB: [birth year], 69 y.o.   MRN: [id number]   HPI: Jimmy Park is a 69 y.o. male presenting for  presents for  follow-up of hypertension. Patient has no history of headache chest pain or shortness of breath or recent cough. Patient also denies symptoms of TIA such as focal numbness or weakness. Patient denies side effects from medication. States taking it regularly. Can go to [id number] systolic on occasion.  Coughing more. A little short of breath. Can perform ADLs & IADLs. Can ride on his lawnmower.  Patient does not like the Breo.  He says it seems to make him more short of breath instead of better.  Therefore he quit using it.  Patient in for follow-up of GERD. Currently asymptomatic taking  PPI daily. There is no chest pain or heartburn. No hematemesis and no melena. No dysphagia or choking. Onset is remote. Progression is stable. Complicating factors, none.    Depression screen [facility] 2/9 02/09/2019 08/09/2018 02/08/2018 08/20/2017 08/06/2017  Decreased Interest 0 0 0 0 0  Down, Depressed, Hopeless 0 0 0 0 0  PHQ - 2 Score 0 0 0 0 0     Relevant past medical, surgical, family and social history reviewed and updated as indicated.  Interim medical history since our last visit reviewed. Allergies and medications reviewed and updated.  ROS:  Review of Systems  Constitutional: Negative.   HENT: Negative.   Eyes: Negative for visual disturbance.  Respiratory: Positive for cough and shortness of breath (with exertion).   Cardiovascular: Negative for chest pain and leg swelling.  Gastrointestinal: Negative for abdominal pain, diarrhea, nausea and vomiting.  Genitourinary: Negative for difficulty urinating.  Musculoskeletal: Negative for arthralgias and myalgias.  Skin: Negative for rash.  Neurological: Negative for headaches.  Psychiatric/Behavioral: Negative for sleep disturbance.     Social History   Tobacco Use  Smoking Status Former  Smoker  . Packs/day: 0.50  . Types: Cigarettes  . Start date: 06/09/1967  . Quit date: 06/08/1973  . Years since quitting: 46.3  Smokeless Tobacco Never Used       Objective:     Wt Readings from Last 3 Encounters:  08/09/18 187 lb (84.8 kg)  02/08/18 178 lb 9.6 oz (81 kg)  08/20/17 178 lb 3.2 oz (80.8 kg)     Exam deferred. Pt. Harboring due to COVID 19. Phone visit performed.   Assessment & Plan:   1. Gastroesophageal reflux disease without esophagitis   2. Benign essential HTN   3. Mild intermittent asthma without complication   4. Allergic rhinitis, unspecified seasonality, unspecified trigger   5. Vitamin B12 deficiency     Meds ordered this encounter  Medications  . metoprolol tartrate (LOPRESSOR) 25 MG tablet    Sig: TAKE 1 TABLET BY MOUTH EVERY DAY (Needs to be seen before next refill)    Dispense:  90 tablet    Refill:  1  . Fluticasone-Salmeterol (ADVAIR DISKUS) 250-50 MCG/DOSE AEPB    Sig: Inhale 1 puff into the lungs 2 (two) times daily.    Dispense:  60 each    Refill:  11  . valsartan (DIOVAN) 320 MG tablet    Sig: Take 1 tablet (320 mg total) by mouth daily.    Dispense:  90 tablet    Refill:  1  . omeprazole (PRILOSEC) 40 MG capsule    Sig: Take 1 capsule (40 mg total) by mouth daily.  Dispense:  90 capsule    Refill:  1  . montelukast (SINGULAIR) 10 MG tablet    Sig: Take 1 tablet (10 mg total) by mouth daily.    Dispense:  90 tablet    Refill:  1    DX Code Needed  REFILLS NEEDED.  . fexofenadine (ALLEGRA) 180 MG tablet    Sig: Take 1 tablet (180 mg total) by mouth daily.    Dispense:  90 tablet    Refill:  1  . cyanocobalamin (,VITAMIN B-12,) 1000 MCG/ML injection    Sig: Inject 1 mL (1,000 mcg total) into the skin every 30 (thirty) days.    Dispense:  1 mL    Refill:  11  . albuterol (VENTOLIN HFA) 108 (90 Base) MCG/ACT inhaler    Sig: TAKE 2 PUFFS BY MOUTH EVERY 6 HOURS AS NEEDED FOR WHEEZE OR SHORTNESS OF BREATH    Dispense:  18 g     Refill:  3    No orders of the defined types were placed in this encounter.     Diagnoses and all orders for this visit:  Gastroesophageal reflux disease without esophagitis  Benign essential HTN -     metoprolol tartrate (LOPRESSOR) 25 MG tablet; TAKE 1 TABLET BY MOUTH EVERY DAY (Needs to be seen before next refill) -     valsartan (DIOVAN) 320 MG tablet; Take 1 tablet (320 mg total) by mouth daily.  Mild intermittent asthma without complication -     Fluticasone-Salmeterol (ADVAIR DISKUS) 250-50 MCG/DOSE AEPB; Inhale 1 puff into the lungs 2 (two) times daily. -     montelukast (SINGULAIR) 10 MG tablet; Take 1 tablet (10 mg total) by mouth daily. -     albuterol (VENTOLIN HFA) 108 (90 Base) MCG/ACT inhaler; TAKE 2 PUFFS BY MOUTH EVERY 6 HOURS AS NEEDED FOR WHEEZE OR SHORTNESS OF BREATH  Allergic rhinitis, unspecified seasonality, unspecified trigger -     fexofenadine (ALLEGRA) 180 MG tablet; Take 1 tablet (180 mg total) by mouth daily.  Vitamin B12 deficiency -     cyanocobalamin (,VITAMIN B-12,) 1000 MCG/ML injection; Inject 1 mL (1,000 mcg total) into the skin every 30 (thirty) days.  Other orders -     omeprazole (PRILOSEC) 40 MG capsule; Take 1 capsule (40 mg total) by mouth daily.    Virtual Visit via telephone Note  I discussed the limitations, risks, security and privacy concerns of performing an evaluation and management service by telephone and the availability of in person appointments. The patient was identified with two identifiers. Pt.expressed understanding and agreed to proceed. Pt. Is at home. Dr. Livia Snellen is in his office.  Follow Up Instructions:   I discussed the assessment and treatment plan with the patient. The patient was provided an opportunity to ask questions and all were answered. The patient agreed with the plan and demonstrated an understanding of the instructions.   The patient was advised to call back or seek an in-person evaluation if the  symptoms worsen or if the condition fails to improve as anticipated.   Total minutes including chart review and phone contact time: 24   Follow up plan: Return in about 6 months (around 04/10/2020), or if symptoms worsen or fail to improve.  Claretta Fraise, MD [place]

## 2019-10-16 ENCOUNTER — Telehealth: Payer: Self-pay | Admitting: Family Medicine

## 2019-10-16 NOTE — Telephone Encounter (Signed)
  Incoming Patient Call  10/16/2019  What symptoms do you have? Wheezing and cough and starting to bother both ears. Had appt with St[facility] last week and [facility] told him to use Albuterol and Advair. Patient is still coughing and wants Levofloxacin 500 mg called in for ears. That helped him the last time this flared up  How long have you been sick? Since las week  Have you been seen for this problem? Yes  If your provider decides to give you a prescription, which pharmacy would you like for it to be sent to[facility] [place] [facility]   Patient informed that this information will be sent to the clinical staff for review and that they should receive a follow up call.

## 2019-10-18 ENCOUNTER — Other Ambulatory Visit: Payer: Self-pay | Admitting: Family Medicine

## 2019-10-18 MED ORDER — AZITHROMYCIN 250 MG PO TABS
ORAL_TABLET | ORAL | 0 refills | Status: DC
Start: 2019-10-18 — End: 2020-02-23

## 2019-10-18 NOTE — Telephone Encounter (Signed)
Left detailed message stating antibiotic was sent to pharmacy

## 2019-10-18 NOTE — Telephone Encounter (Signed)
Wife states that patient has been using Albuterol, Advair, Allegra, Singulair and Flonase.  Complains of cough, nasal congestion, wheezing, dyspnea, ear pain and low grade fevers over the weekend with no relief from previous medications listed.  Patient would like to have an antibiotic sent to pharmacy.  Patient also states that he has had his COVID-19 vaccines not sure of the date will will send card through [facility] later today

## 2019-10-18 NOTE — Telephone Encounter (Signed)
I sent in a zpack for him

## 2019-11-07 ENCOUNTER — Ambulatory Visit: Payer: Medicare HMO | Admitting: Family Medicine

## 2019-12-26 DIAGNOSIS — L814 Other melanin hyperpigmentation: Secondary | ICD-10-CM | POA: Diagnosis not present

## 2019-12-26 DIAGNOSIS — D485 Neoplasm of uncertain behavior of skin: Secondary | ICD-10-CM | POA: Diagnosis not present

## 2019-12-26 DIAGNOSIS — L570 Actinic keratosis: Secondary | ICD-10-CM | POA: Diagnosis not present

## 2019-12-26 DIAGNOSIS — Z85828 Personal history of other malignant neoplasm of skin: Secondary | ICD-10-CM | POA: Diagnosis not present

## 2019-12-26 DIAGNOSIS — D239 Other benign neoplasm of skin, unspecified: Secondary | ICD-10-CM | POA: Diagnosis not present

## 2019-12-26 DIAGNOSIS — L918 Other hypertrophic disorders of the skin: Secondary | ICD-10-CM | POA: Diagnosis not present

## 2020-01-06 ENCOUNTER — Other Ambulatory Visit: Payer: Self-pay | Admitting: Family Medicine

## 2020-01-06 DIAGNOSIS — J069 Acute upper respiratory infection, unspecified: Secondary | ICD-10-CM

## 2020-02-23 ENCOUNTER — Ambulatory Visit (INDEPENDENT_AMBULATORY_CARE_PROVIDER_SITE_OTHER): Payer: Medicare HMO

## 2020-02-23 DIAGNOSIS — Z0000 Encounter for general adult medical examination without abnormal findings: Secondary | ICD-10-CM | POA: Diagnosis not present

## 2020-02-23 NOTE — Patient Instructions (Signed)
  [place] Maintenance Summary and Written Plan of Care  Mr. Lycan ,  Thank you for allowing me to perform your Medicare Annual Wellness Visit and for your ongoing commitment to your health.   Health Maintenance & Immunization History Health Maintenance  Topic Date Due  . Hepatitis C Screening  Never done  . COVID-19 Vaccine (1) Never done  . INFLUENZA VACCINE  01/07/2020  . COLONOSCOPY  05/08/2024  . TETANUS/TDAP  08/08/2028  . PNA vac Low Risk Adult  Completed   Immunization History  Administered Date(s) Administered  . DTaP 09/18/2007  . Fluad Quad(high Dose 65+) 03/08/2019  . Influenza Whole 05/14/2010, 07/01/2011, 02/15/2012, 03/08/2013, 02/26/2014  . Influenza, High Dose Seasonal PF 03/17/2018  . Influenza,inj,Quad PF,6+ Mos 03/27/2015, 02/26/2016  . Influenza-Unspecified 03/17/2017  . Pneumococcal Conjugate-13 08/06/2017  . Pneumococcal Polysaccharide-23 08/09/2018  . Pneumococcal-Unspecified 02/15/2012  . Td 08/09/2018  . Tdap 09/18/2007    These are the patient goals that we discussed: Goals Addressed            This Visit's Progress   . DIET - INCREASE WATER INTAKE   Not on track    Try to drink 6-8 glasses of water daily.    . Prevent falls          This is a list of Health Maintenance Items that are overdue or due now: Health Maintenance Due  Topic Date Due  . Hepatitis C Screening  Never done  . COVID-19 Vaccine (1) Never done  . INFLUENZA VACCINE  01/07/2020     Orders/Referrals Placed Today: No orders of the defined types were placed in this encounter.  (Contact our referral department at [phone] if you have not spoken with someone about your referral appointment within the next 5 days)    Follow-up Plan  Scheduled with Dr. Livia Snellen 04/10/20 at 8:55am.

## 2020-02-23 NOTE — Progress Notes (Signed)
MEDICARE ANNUAL WELLNESS VISIT  02/23/2020  Telephone Visit Disclaimer This Medicare AWV was conducted by telephone due to national recommendations for restrictions regarding the COVID-19 Pandemic (e.g. social distancing).  I verified, using two identifiers, that I am speaking with Jimmy Park or their authorized healthcare agent. I discussed the limitations, risks, security, and privacy concerns of performing an evaluation and management service by telephone and the potential availability of an in-person appointment in the future. The patient expressed understanding and agreed to proceed.  Location of Patient: Home Location of Provider (nurse):  [facility]  Subjective:    Jimmy Park is a 69 y.o. male patient of Stacks, [name], MD who had a Medicare Annual Wellness Visit today via telephone. [name] is Retired and lives with their spouse. he has 2 biological children and 2 step children. he reports that he is socially active and does interact with friends/family regularly. he is minimally physically active and enjoys reading.  Patient Care Team: Claretta Fraise, MD as PCP - General (Family Medicine)  Advanced Directives 02/23/2020 02/09/2019 01/09/2015  Does Patient Have a Medical Advance Directive? Yes Yes Yes  Type of Paramedic of [place];Living will [place];Living will -  Does patient want to make changes to medical advance directive? No - Patient declined No - Patient declined -  Copy of [place] in Chart? - No - copy requested [facility] Utilization Over the Past 12 Months: # of hospitalizations or ER visits: 0 # of surgeries: 0  Review of Systems    Patient reports that his overall health is unchanged compared to last year.  Patient Reported Readings (BP, Pulse, CBG, Weight, etc) none  Pain Assessment Pain : No/denies pain     Current Medications & Allergies (verified) Allergies as of 02/23/2020   No Known  Allergies     Medication List       Accurate as of February 23, 2020 10:27 AM. If you have any questions, ask your nurse or doctor.        STOP taking these medications   azithromycin 250 MG tablet Commonly known as: Zithromax Z-Pak     TAKE these medications   albuterol 108 (90 Base) MCG/ACT inhaler Commonly known as: VENTOLIN HFA TAKE 2 PUFFS BY MOUTH EVERY 6 HOURS AS NEEDED FOR WHEEZE OR SHORTNESS OF BREATH   aspirin 81 MG tablet Take 1 tablet by mouth daily.   cyanocobalamin 1000 MCG/ML injection Commonly known as: (VITAMIN B-12) Inject 1 mL (1,000 mcg total) into the skin every 30 (thirty) days.   fexofenadine 180 MG tablet Commonly known as: ALLEGRA Take 1 tablet (180 mg total) by mouth daily.   Fish Oil 1000 MG Caps Take by mouth.   Fluticasone-Salmeterol 250-50 MCG/DOSE Aepb Commonly known as: Advair Diskus Inhale 1 puff into the lungs 2 (two) times daily.   folic acid 1 MG tablet Commonly known as: FOLVITE Take 1 tablet by mouth daily.   ibuprofen 400 MG tablet Commonly known as: ADVIL Take 1 tablet by mouth 2 (two) times daily as needed.   metoprolol tartrate 25 MG tablet Commonly known as: LOPRESSOR TAKE 1 TABLET BY MOUTH EVERY DAY (Needs to be seen before next refill)   montelukast 10 MG tablet Commonly known as: SINGULAIR Take 1 tablet (10 mg total) by mouth daily.   omeprazole 40 MG capsule Commonly known as: PRILOSEC Take 1 capsule (40 mg total) by mouth daily.   valsartan 320  MG tablet Commonly known as: DIOVAN Take 1 tablet (320 mg total) by mouth daily.       History (reviewed): Past Medical History:  Diagnosis Date  . Allergy   . Anemia   . Asthma   . B12 deficiency   . Cerebral ventriculomegaly   . Gait abnormality   . Hypertension    Past Surgical History:  Procedure Laterality Date  . LUMBAR DRAIN IMPLANTATION     Family History  Problem Relation Age of Onset  . Diabetes Mother   . Hypertension Mother   .  Diabetes Father   . Hypertension Father   . Asthma Brother    Social History   Socioeconomic History  . Marital status: Married    Spouse name: [name]  . Number of children: 4  . Years of education: 27  . Highest education level: High school graduate  Occupational History  . Occupation: retired  Tobacco Use  . Smoking status: Former Smoker    Packs/day: 0.50    Types: Cigarettes    Start date: 06/09/1967    Quit date: 06/08/1973    Years since quitting: 46.7  . Smokeless tobacco: Never Used  Vaping Use  . Vaping Use: Never used  Substance and Sexual Activity  . Alcohol use: Not Currently    Alcohol/week: 0.0 standard drinks  . Drug use: No  . Sexual activity: Not Currently  Other Topics Concern  . Not on file  Social History Narrative  . Not on file   Social Determinants of Health   Financial Resource Strain:   . Difficulty of Paying Living Expenses: Not on file  Food Insecurity:   . Worried About Charity fundraiser in the Last Year: Not on file  . Ran Out of Food in the Last Year: Not on file  Transportation Needs:   . Lack of Transportation (Medical): Not on file  . Lack of Transportation (Non-Medical): Not on file  Physical Activity:   . Days of Exercise per Week: Not on file  . Minutes of Exercise per Session: Not on file  Stress:   . Feeling of Stress : Not on file  Social Connections:   . Frequency of Communication with Friends and Family: Not on file  . Frequency of Social Gatherings with Friends and Family: Not on file  . Attends Religious Services: Not on file  . Active Member of Clubs or Organizations: Not on file  . Attends Archivist Meetings: Not on file  . Marital Status: Not on file    Activities of Daily Living In your present state of health, do you have any difficulty performing the following activities: 02/23/2020  Hearing? N  Vision? N  Difficulty concentrating or making decisions? N  Walking or climbing stairs? Y  Dressing or  bathing? N  Doing errands, shopping? N  Preparing Food and eating ? N  Using the Toilet? N  In the past six months, have you accidently leaked urine? N  Do you have problems with loss of bowel control? N  Managing your Medications? N  Managing your Finances? N  Housekeeping or managing your Housekeeping? N  Some recent data might be hidden    Patient Education/ Literacy How often do you need to have someone help you when you read instructions, pamphlets, or other written materials from your doctor or pharmacy?: 2 - Rarely What is the last grade level you completed in school?: 12  Exercise Current Exercise Habits: Home exercise routine, Type  of exercise: walking, Time (Minutes): 20, Frequency (Times/Week): 5, Weekly Exercise (Minutes/Week): 100, Intensity: Mild, Exercise limited by: cardiac condition(s);orthopedic condition(s)  Diet Patient reports consuming 2 meals a day and 2 snack(s) a day Patient reports that his primary diet is: Regular Patient reports that she does have regular access to food.   Depression Screen PHQ 2/9 Scores 02/23/2020 02/09/2019 08/09/2018 02/08/2018 08/20/2017 08/06/2017 06/19/2017  PHQ - 2 Score 0 0 0 0 0 0 0     Fall Risk Fall Risk  02/23/2020 02/09/2019 08/09/2018 02/08/2018 08/20/2017  Falls in the past year? 0 0 0 Yes Yes  Number falls in past yr: - 0 - 1 1  Injury with Fall? - 0 - Yes Yes  Comment - - - - Hip pain.  Risk for fall due to : - Impaired balance/gait;Impaired mobility - History of fall(s);Impaired balance/gait -  Follow up - Falls prevention discussed - - -  Comment - get rid of all throw rugs in the house, adequate lighting in walkways and grab bars in the bathroom - - -     Objective:  Shadeed Narayan seemed alert and oriented and he participated appropriately during our telephone visit.  Blood Pressure Weight BMI  BP Readings from Last 3 Encounters:  08/09/18 (!) 144/74  02/08/18 (!) 156/86  08/20/17 (!) 142/82   Wt Readings from Last 3  Encounters:  08/09/18 187 lb (84.8 kg)  02/08/18 178 lb 9.6 oz (81 kg)  08/20/17 178 lb 3.2 oz (80.8 kg)   BMI Readings from Last 1 Encounters:  08/09/18 29.29 kg/m    *Unable to obtain current vital signs, weight, and BMI due to telephone visit type  Hearing/Vision  . [place] did not seem to have difficulty with hearing/understanding during the telephone conversation . Reports that he has had a formal eye exam by an eye care professional within the past year . Reports that he has had a formal hearing evaluation within the past year *Unable to fully assess hearing and vision during telephone visit type  Cognitive Function: 6CIT Screen 02/23/2020 02/23/2020 02/09/2019  What Year? - 0 points 0 points  What month? - 0 points 0 points  What time? - 0 points 0 points  Count back from 20 - 0 points 0 points  Months in reverse - 0 points 0 points  Repeat phrase 2 points 0 points 4 points  Total Score - 0 4   (Normal:0-7, Significant for Dysfunction: >8)  Normal Cognitive Function Screening: Yes   Immunization & Health Maintenance Record Immunization History  Administered Date(s) Administered  . DTaP 09/18/2007  . Fluad Quad(high Dose 65+) 03/08/2019  . Influenza Whole 05/14/2010, 07/01/2011, 02/15/2012, 03/08/2013, 02/26/2014  . Influenza, High Dose Seasonal PF 03/17/2018  . Influenza,inj,Quad PF,6+ Mos 03/27/2015, 02/26/2016  . Influenza-Unspecified 03/17/2017  . Pneumococcal Conjugate-13 08/06/2017  . Pneumococcal Polysaccharide-23 08/09/2018  . Pneumococcal-Unspecified 02/15/2012  . Td 08/09/2018  . Tdap 09/18/2007    Health Maintenance  Topic Date Due  . Hepatitis C Screening  Never done  . COVID-19 Vaccine (1) Never done  . INFLUENZA VACCINE  01/07/2020  . COLONOSCOPY  05/08/2024  . TETANUS/TDAP  08/08/2028  . PNA vac Low Risk Adult  Completed       Assessment  This is a routine wellness examination for [facility].  Health Maintenance: Due or Overdue Health  Maintenance Due  Topic Date Due  . Hepatitis C Screening  Never done  . COVID-19 Vaccine (1) Never done  . INFLUENZA  VACCINE  01/07/2020    Arvine Bergsma does not need a referral for Community Assistance: Care Management:   no Social Work:    no Prescription Assistance:  no Nutrition/Diabetes Education:  no   Plan:  Personalized Goals Goals Addressed            This Visit's Progress   . DIET - INCREASE WATER INTAKE   Not on track    Try to drink 6-8 glasses of water daily.    . Prevent falls        Personalized Health Maintenance & Screening Recommendations   Lung Cancer Screening Recommended: no (Low Dose CT Chest recommended if Age 50-80 years, 30 pack-year currently smoking OR have quit w/in past 15 years) Hepatitis C Screening recommended: no HIV Screening recommended: no  Advanced Directives: Written information was not prepared per patient's request.  Referrals & Orders No orders of the defined types were placed in this encounter.   Follow-up Plan . Follow-up with Claretta Fraise, MD as planned . Schedule 04/10/2020   I have personally reviewed and noted the following in the patient's chart:   . Medical and social history . Use of alcohol, tobacco or illicit drugs  . Current medications and supplements . Functional ability and status . Nutritional status . Physical activity . Advanced directives . List of other physicians . Hospitalizations, surgeries, and ER visits in previous 12 months . Vitals . Screenings to include cognitive, depression, and falls . Referrals and appointments  In addition, I have reviewed and discussed with [facility] certain preventive protocols, quality metrics, and best practice recommendations. A written personalized care plan for preventive services as well as general preventive health recommendations is available and can be mailed to the patient at his request.      Maud Deed [facility]  [phone]

## 2020-03-13 ENCOUNTER — Other Ambulatory Visit: Payer: Self-pay | Admitting: Family Medicine

## 2020-03-13 ENCOUNTER — Telehealth: Payer: Self-pay

## 2020-03-13 MED ORDER — CETIRIZINE HCL 10 MG PO TABS: 10.0000 mg | ORAL_TABLET | Freq: Every day | ORAL | 3 refills | Status: DC

## 2020-03-13 NOTE — Telephone Encounter (Signed)
Pts wife called stating that pt has already been prescribed Cetirizine and has been taking it for a while now. Pt needs something else prescribed for cough and ear aches.

## 2020-03-13 NOTE — Telephone Encounter (Signed)
  Incoming Patient Call  03/13/2020  What symptoms do you have? Seasonal allergies ears are bothering him  How long have you been sick? Started over the weekend  Have you been seen for this problem? In the past Dr. Livia Snellen has prescribed something for him he does this every season  If your provider decides to give you a prescription, which pharmacy would you like for it to be sent to? [facility]    Patient informed that this information will be sent to the clinical staff for review and that they should receive a follow up call.

## 2020-03-13 NOTE — Telephone Encounter (Signed)
Done. Thanks, WS 

## 2020-03-13 NOTE — Telephone Encounter (Signed)
Patient aware and verbalized understanding. °

## 2020-03-13 NOTE — Telephone Encounter (Signed)
Pt. Needs to be seen for this. Thanks, WS 

## 2020-03-20 NOTE — Telephone Encounter (Signed)
Patient has appt scheduled for 04/10/20 with Dr. Livia Snellen

## 2020-03-22 ENCOUNTER — Ambulatory Visit (INDEPENDENT_AMBULATORY_CARE_PROVIDER_SITE_OTHER): Payer: Medicare HMO

## 2020-03-22 ENCOUNTER — Other Ambulatory Visit: Payer: Self-pay | Admitting: Family Medicine

## 2020-03-22 ENCOUNTER — Other Ambulatory Visit: Payer: Self-pay

## 2020-03-22 DIAGNOSIS — Z23 Encounter for immunization: Secondary | ICD-10-CM

## 2020-03-22 DIAGNOSIS — I10 Essential (primary) hypertension: Secondary | ICD-10-CM

## 2020-03-22 DIAGNOSIS — J069 Acute upper respiratory infection, unspecified: Secondary | ICD-10-CM

## 2020-03-28 DIAGNOSIS — R69 Illness, unspecified: Secondary | ICD-10-CM | POA: Diagnosis not present

## 2020-04-01 ENCOUNTER — Encounter: Payer: Self-pay | Admitting: Family Medicine

## 2020-04-01 ENCOUNTER — Telehealth: Payer: Self-pay

## 2020-04-01 NOTE — Telephone Encounter (Signed)
Televisit appointment made and patient aware

## 2020-04-01 NOTE — Telephone Encounter (Signed)
Wife states that patient is having non productive cough, wheezing and bilateral ear pain-Left worse. Wife also states that patient may have had a low-grade fever yesterday. Televisit appointment made for 04/02/20, wife and patient aware.

## 2020-04-02 ENCOUNTER — Encounter: Payer: Self-pay | Admitting: Family Medicine

## 2020-04-02 ENCOUNTER — Ambulatory Visit (INDEPENDENT_AMBULATORY_CARE_PROVIDER_SITE_OTHER): Payer: Medicare HMO | Admitting: Family Medicine

## 2020-04-02 DIAGNOSIS — J4541 Moderate persistent asthma with (acute) exacerbation: Secondary | ICD-10-CM

## 2020-04-02 DIAGNOSIS — H66003 Acute suppurative otitis media without spontaneous rupture of ear drum, bilateral: Secondary | ICD-10-CM

## 2020-04-02 DIAGNOSIS — J069 Acute upper respiratory infection, unspecified: Secondary | ICD-10-CM

## 2020-04-02 MED ORDER — CEFDINIR 300 MG PO CAPS: 300.0000 mg | ORAL_CAPSULE | Freq: Two times a day (BID) | ORAL | 0 refills | Status: DC

## 2020-04-02 MED ORDER — BENZONATATE 200 MG PO CAPS: 200.0000 mg | ORAL_CAPSULE | Freq: Two times a day (BID) | ORAL | 0 refills | Status: DC | PRN

## 2020-04-02 MED ORDER — PREDNISONE 20 MG PO TABS: ORAL_TABLET | ORAL | 0 refills | Status: DC

## 2020-04-02 NOTE — Progress Notes (Signed)
Telephone visit  Subjective: CC: URI PCP: Claretta Fraise, MD Jimmy Park is a 69 y.o. male calls for telephone consult today. Patient provides verbal consent for consult held via phone.  Due to COVID-19 pandemic this visit was conducted virtually. This visit type was conducted due to national recommendations for restrictions regarding the COVID-19 Pandemic (e.g. social distancing, sheltering in place) in an effort to limit this patient's exposure and mitigate transmission in our community. All issues noted in this document were discussed and addressed.  A physical exam was not performed with this format.   Location of patient: home Location of provider: [facility] Others present for call: Doris, spouse  1. URI Wife reports that he has been having a throbbing ear pain bilaterally.  No drainage or bleeding from ears.  She reports symptoms onset 3 weeks ago.  He has a dry cough that is worse at night and is refractory to OTC cough medications.  Symptoms seem to wax and wane in severity.  She used prescription ear drops that were a few years old but this didn't seem to help.  She reports low grade fever last week. She reports chills/ sweating, wheezing.  He has asthma.  He is compliant with Advair, Albuterol.  He has been vaccinated against COVID-19.   ROS: Per HPI  No Known Allergies Past Medical History:  Diagnosis Date  . Allergy   . Anemia   . Asthma   . B12 deficiency   . Cerebral ventriculomegaly   . Gait abnormality   . Hypertension     Current Outpatient Medications:  .  albuterol (VENTOLIN HFA) 108 (90 Base) MCG/ACT inhaler, TAKE 2 PUFFS BY MOUTH EVERY 6 HOURS AS NEEDED FOR WHEEZE OR SHORTNESS OF BREATH, Disp: 18 g, Rfl: 3 .  aspirin 81 MG tablet, Take 1 tablet by mouth daily., Disp: , Rfl:  .  cetirizine (ZYRTEC) 10 MG tablet, Take 1 tablet (10 mg total) by mouth daily., Disp: 90 tablet, Rfl: 3 .  cyanocobalamin (,VITAMIN B-12,) 1000 MCG/ML injection, Inject 1 mL (1,000 mcg  total) into the skin every 30 (thirty) days., Disp: 1 mL, Rfl: 11 .  fexofenadine (ALLEGRA) 180 MG tablet, Take 1 tablet (180 mg total) by mouth daily., Disp: 90 tablet, Rfl: 1 .  Fluticasone-Salmeterol (ADVAIR DISKUS) 250-50 MCG/DOSE AEPB, Inhale 1 puff into the lungs 2 (two) times daily., Disp: 60 each, Rfl: 11 .  folic acid (FOLVITE) 1 MG tablet, Take 1 tablet by mouth daily., Disp: , Rfl:  .  ibuprofen (ADVIL,MOTRIN) 400 MG tablet, Take 1 tablet by mouth 2 (two) times daily as needed., Disp: , Rfl:  .  metoprolol tartrate (LOPRESSOR) 25 MG tablet, TAKE 1 TABLET BY MOUTH EVERY DAY, Disp: 90 tablet, Rfl: 0 .  montelukast (SINGULAIR) 10 MG tablet, Take 1 tablet (10 mg total) by mouth daily., Disp: 90 tablet, Rfl: 1 .  Omega-3 Fatty Acids (FISH OIL) 1000 MG CAPS, Take by mouth., Disp: , Rfl:  .  omeprazole (PRILOSEC) 40 MG capsule, Take 1 capsule (40 mg total) by mouth daily., Disp: 90 capsule, Rfl: 1 .  valsartan (DIOVAN) 320 MG tablet, Take 1 tablet (320 mg total) by mouth daily., Disp: 90 tablet, Rfl: 1  Assessment/ Plan: 69 y.o. male   1. Moderate persistent asthma with acute exacerbation Empiric treatment with antibiotics, corticosteroids and Tessalon Perles. We discussed that there is a possibility that this is viral mediated and may in fact be COVID-19 variant. I have recommended that he come in for  testing for this reason. His wife voices good understanding of the plan and reasons for urgent and emergent evaluation. Okay to use albuterol inhaler 2 puffs every 6 hours scheduled for the next 2 days and then as needed as directed - cefdinir (OMNICEF) 300 MG capsule; Take 1 capsule (300 mg total) by mouth 2 (two) times daily. 1 po BID  Dispense: 20 capsule; Refill: 0 - predniSONE (DELTASONE) 20 MG tablet; 2 po at same time daily for 5 days  Dispense: 10 tablet; Refill: 0 - benzonatate (TESSALON) 200 MG capsule; Take 1 capsule (200 mg total) by mouth 2 (two) times daily as needed for cough.   Dispense: 20 capsule; Refill: 0  2. Non-recurrent acute suppurative otitis media of both ears without spontaneous rupture of tympanic membranes - cefdinir (OMNICEF) 300 MG capsule; Take 1 capsule (300 mg total) by mouth 2 (two) times daily. 1 po BID  Dispense: 20 capsule; Refill: 0  3. URI with cough and congestion - Novel Coronavirus, NAA ([facility])   Start time: 12:40pm End time: 12:51pm  Total time spent on patient care (including telephone call/ virtual visit): 11 minutes  [place], [place] [phone]

## 2020-04-03 LAB — NOVEL CORONAVIRUS, NAA: SARS-CoV-2, NAA: NOT DETECTED

## 2020-04-03 LAB — SARS-COV-2, NAA 2 DAY TAT

## 2020-04-10 ENCOUNTER — Encounter: Payer: Self-pay | Admitting: Family Medicine

## 2020-04-10 ENCOUNTER — Other Ambulatory Visit: Payer: Self-pay

## 2020-04-10 ENCOUNTER — Ambulatory Visit (INDEPENDENT_AMBULATORY_CARE_PROVIDER_SITE_OTHER): Payer: Medicare HMO | Admitting: Family Medicine

## 2020-04-10 VITALS — BP 143/87 | HR 68 | Temp 97.0°F | Resp 20 | Ht 67.0 in | Wt 180.5 lb

## 2020-04-10 DIAGNOSIS — Z1159 Encounter for screening for other viral diseases: Secondary | ICD-10-CM

## 2020-04-10 DIAGNOSIS — I421 Obstructive hypertrophic cardiomyopathy: Secondary | ICD-10-CM

## 2020-04-10 DIAGNOSIS — J4541 Moderate persistent asthma with (acute) exacerbation: Secondary | ICD-10-CM | POA: Diagnosis not present

## 2020-04-10 DIAGNOSIS — J4520 Mild intermittent asthma, uncomplicated: Secondary | ICD-10-CM | POA: Diagnosis not present

## 2020-04-10 DIAGNOSIS — K219 Gastro-esophageal reflux disease without esophagitis: Secondary | ICD-10-CM

## 2020-04-10 DIAGNOSIS — E538 Deficiency of other specified B group vitamins: Secondary | ICD-10-CM | POA: Diagnosis not present

## 2020-04-10 DIAGNOSIS — Z125 Encounter for screening for malignant neoplasm of prostate: Secondary | ICD-10-CM

## 2020-04-10 DIAGNOSIS — I10 Essential (primary) hypertension: Secondary | ICD-10-CM | POA: Diagnosis not present

## 2020-04-10 MED ORDER — LEVOFLOXACIN 500 MG PO TABS: 500.0000 mg | ORAL_TABLET | Freq: Every day | ORAL | 0 refills | Status: DC

## 2020-04-10 MED ORDER — FLUTICASONE-SALMETEROL 250-50 MCG/DOSE IN AEPB: 1.0000 | INHALATION_SPRAY | Freq: Two times a day (BID) | RESPIRATORY_TRACT | 11 refills | Status: DC

## 2020-04-10 MED ORDER — METOPROLOL TARTRATE 25 MG PO TABS: 25.0000 mg | ORAL_TABLET | Freq: Two times a day (BID) | ORAL | 1 refills | Status: DC

## 2020-04-10 MED ORDER — BETAMETHASONE SOD PHOS & ACET 6 (3-3) MG/ML IJ SUSP
6.0000 mg | Freq: Once | INTRAMUSCULAR | Status: AC
  Administered 2020-04-10: 6 mg via INTRAMUSCULAR

## 2020-04-10 NOTE — Progress Notes (Signed)
Subjective:  Patient ID: Jimmy Park, male    DOB: 1951/01/05  Age: 69 y.o. MRN: [id number]  CC: Medical Management of Chronic Issues   HPI Jimmy Park presents for 78-month follow-up of his chronic conditions including asthma reflux and hypertension.  Follow-up of hypertension. Patient has no history of headache chest pain or shortness of breath or recent cough. Patient also denies symptoms of TIA such as numbness weakness lateralizing. Patient checks  blood pressure at home and has not had any elevated readings recently. Patient denies side effects from his medication. States taking it regularly.  Patient in for follow-up of GERD. Currently asymptomatic taking  PPI daily. There is no chest pain or heartburn. No hematemesis and no melena. No dysphagia or choking. Onset is remote. Progression is stable. Complicating factors, none.  Patient says that he gets dyspneic even with mild exertion around the house.  He has wheezing.  Still having cough periodically.  He has been using albuterol regularly but has only been using the Advair as needed and not very often.  He reports that blood pressures are staying high at home as well.  Depression screen [facility] 2/9 04/10/2020 02/23/2020 02/09/2019  Decreased Interest 0 0 0  Down, Depressed, Hopeless 0 0 0  PHQ - 2 Score 0 0 0    History Jimmy Park has a past medical history of Allergy, Anemia, Asthma, B12 deficiency, Cerebral ventriculomegaly, Gait abnormality, and Hypertension.   He has a past surgical history that includes Lumbar drain implantation.   His family history includes Asthma in his brother; Diabetes in his father and mother; Hypertension in his father and mother.He reports that he quit smoking about 46 years ago. His smoking use included cigarettes. He started smoking about 52 years ago. He smoked 0.50 packs per day. He has never used smokeless tobacco. He reports previous alcohol use. He reports that he does not use drugs.    ROS Review of  Systems  Constitutional: Positive for fatigue. Negative for fever.  HENT: Negative.   Eyes: Negative for visual disturbance.  Respiratory: Positive for cough, shortness of breath and wheezing.   Cardiovascular: Negative for chest pain and leg swelling.  Gastrointestinal: Negative for abdominal pain, diarrhea, nausea and vomiting.  Genitourinary: Negative for difficulty urinating.  Musculoskeletal: Negative for arthralgias and myalgias.  Skin: Negative for rash.  Neurological: Negative for headaches.  Psychiatric/Behavioral: Negative for sleep disturbance.    Objective:  BP (!) 143/87   Pulse 68   Temp (!) 97 F (36.1 C) (Temporal)   Resp 20   Ht 5\' 7"  (1.702 m)   Wt 180 lb 8 oz (81.9 kg)   SpO2 95%   BMI 28.27 kg/m   BP Readings from Last 3 Encounters:  04/10/20 (!) 143/87  08/09/18 (!) 144/74  02/08/18 (!) 156/86    Wt Readings from Last 3 Encounters:  04/10/20 180 lb 8 oz (81.9 kg)  08/09/18 187 lb (84.8 kg)  02/08/18 178 lb 9.6 oz (81 kg)     Physical Exam Constitutional:      General: He is not in acute distress.    Appearance: He is well-developed.  HENT:     Head: Normocephalic and atraumatic.     Right Ear: External ear normal.     Left Ear: External ear normal.     Nose: Nose normal.  Eyes:     Conjunctiva/sclera: Conjunctivae normal.     Pupils: Pupils are equal, round, and reactive to light.  Cardiovascular:  Rate and Rhythm: Normal rate and regular rhythm.     Heart sounds: Normal heart sounds. No murmur heard.   Pulmonary:     Effort: Pulmonary effort is normal. No respiratory distress.     Breath sounds: Wheezing and rhonchi present. No rales.  Abdominal:     Palpations: Abdomen is soft.     Tenderness: There is no abdominal tenderness.  Musculoskeletal:        General: Normal range of motion.     Cervical back: Normal range of motion and neck supple.  Skin:    General: Skin is warm and dry.  Neurological:     Mental Status: He is  alert and oriented to person, place, and time.     Deep Tendon Reflexes: Reflexes are normal and symmetric.  Psychiatric:        Behavior: Behavior normal.        Thought Content: Thought content normal.        Judgment: Judgment normal.       Assessment & Plan:   Jimmy Park was seen today for medical management of chronic issues.  Diagnoses and all orders for this visit:  Moderate persistent asthma with acute exacerbation -     betamethasone acetate-betamethasone sodium phosphate (CELESTONE) injection 6 mg  Benign essential HTN -     metoprolol tartrate (LOPRESSOR) 25 MG tablet; Take 1 tablet (25 mg total) by mouth 2 (two) times daily.  Vitamin B12 deficiency  Gastroesophageal reflux disease without esophagitis  Cardiomyopathy, hypertrophic obstructive (HCC)  Need for hepatitis C screening test -     Hepatitis C antibody  Screening for prostate cancer -     PSA Total (Reflex To Free)  Mild intermittent asthma without complication -     Fluticasone-Salmeterol (ADVAIR DISKUS) 250-50 MCG/DOSE AEPB; Inhale 1 puff into the lungs 2 (two) times daily.  Other orders -     levofloxacin (LEVAQUIN) 500 MG tablet; Take 1 tablet (500 mg total) by mouth daily.       I have discontinued Jimmy Park's cefdinir and predniSONE. I have also changed his metoprolol tartrate. Additionally, I am having him start on levofloxacin. Lastly, I am having him maintain his aspirin, folic acid, ibuprofen, Fish Oil, valsartan, omeprazole, montelukast, fexofenadine, cyanocobalamin, albuterol, cetirizine, benzonatate, and Fluticasone-Salmeterol. We administered betamethasone acetate-betamethasone sodium phosphate.  Allergies as of 04/10/2020   No Known Allergies     Medication List       Accurate as of April 10, 2020  7:24 PM. If you have any questions, ask your nurse or doctor.        STOP taking these medications   cefdinir 300 MG capsule Commonly known as: OMNICEF Stopped by: Claretta Fraise, MD   predniSONE 20 MG tablet Commonly known as: DELTASONE Stopped by: Claretta Fraise, MD     TAKE these medications   albuterol 108 (90 Base) MCG/ACT inhaler Commonly known as: VENTOLIN HFA TAKE 2 PUFFS BY MOUTH EVERY 6 HOURS AS NEEDED FOR WHEEZE OR SHORTNESS OF BREATH   aspirin 81 MG tablet Take 1 tablet by mouth daily.   benzonatate 200 MG capsule Commonly known as: TESSALON Take 1 capsule (200 mg total) by mouth 2 (two) times daily as needed for cough.   cetirizine 10 MG tablet Commonly known as: ZYRTEC Take 1 tablet (10 mg total) by mouth daily.   cyanocobalamin 1000 MCG/ML injection Commonly known as: (VITAMIN B-12) Inject 1 mL (1,000 mcg total) into the skin every 30 (thirty) days.  fexofenadine 180 MG tablet Commonly known as: ALLEGRA Take 1 tablet (180 mg total) by mouth daily.   Fish Oil 1000 MG Caps Take by mouth.   Fluticasone-Salmeterol 250-50 MCG/DOSE Aepb Commonly known as: Advair Diskus Inhale 1 puff into the lungs 2 (two) times daily.   folic acid 1 MG tablet Commonly known as: FOLVITE Take 1 tablet by mouth daily.   ibuprofen 400 MG tablet Commonly known as: ADVIL Take 1 tablet by mouth 2 (two) times daily as needed.   levofloxacin 500 MG tablet Commonly known as: LEVAQUIN Take 1 tablet (500 mg total) by mouth daily. Started by: Claretta Fraise, MD   metoprolol tartrate 25 MG tablet Commonly known as: LOPRESSOR Take 1 tablet (25 mg total) by mouth 2 (two) times daily. What changed:   how much to take  how to take this  when to take this  additional instructions Changed by: Claretta Fraise, MD   montelukast 10 MG tablet Commonly known as: SINGULAIR Take 1 tablet (10 mg total) by mouth daily.   omeprazole 40 MG capsule Commonly known as: PRILOSEC Take 1 capsule (40 mg total) by mouth daily.   valsartan 320 MG tablet Commonly known as: DIOVAN Take 1 tablet (320 mg total) by mouth daily.      I encouraged him to use the  Advair 1 puff twice daily with the Diskus.  Use the albuterol regularly until his symptoms resolve and he can go back to using it as needed  Follow-up: Return in about 6 months (around 10/08/2020), or if symptoms worsen or fail to improve, for hypertension, Compete physical, asthma.  Claretta Fraise, M.D.

## 2020-04-11 LAB — PSA TOTAL (REFLEX TO FREE): Prostate Specific Ag, Serum: 0.2 ng/mL (ref 0.0–4.0)

## 2020-04-11 LAB — HEPATITIS C ANTIBODY: Hep C Virus Ab: 1.4 s/co ratio — ABNORMAL HIGH (ref 0.0–0.9)

## 2020-05-20 ENCOUNTER — Telehealth: Payer: Self-pay | Admitting: Family Medicine

## 2020-05-20 NOTE — Telephone Encounter (Signed)
Appointment scheduled.

## 2020-05-20 NOTE — Telephone Encounter (Signed)
  Incoming Patient Call  05/20/2020  What symptoms do you have? Coughing and slight fever  How long have you been sick? Since he got up leaves this weekend  Have you been seen for this problem? Yes, about a month ago and was given an antibiotic   If your provider decides to give you a prescription, which pharmacy would you like for it to be sent to? [facility]    Patient informed that this information will be sent to the clinical staff for review and that they should receive a follow up call.

## 2020-05-21 ENCOUNTER — Encounter: Payer: Self-pay | Admitting: Family

## 2020-05-21 ENCOUNTER — Ambulatory Visit (INDEPENDENT_AMBULATORY_CARE_PROVIDER_SITE_OTHER): Payer: Medicare HMO | Admitting: Family

## 2020-05-21 DIAGNOSIS — J4520 Mild intermittent asthma, uncomplicated: Secondary | ICD-10-CM

## 2020-05-21 DIAGNOSIS — J4541 Moderate persistent asthma with (acute) exacerbation: Secondary | ICD-10-CM

## 2020-05-21 MED ORDER — FLUTICASONE-SALMETEROL 250-50 MCG/DOSE IN AEPB: 1.0000 | INHALATION_SPRAY | Freq: Two times a day (BID) | RESPIRATORY_TRACT | 11 refills | Status: DC

## 2020-05-21 MED ORDER — ALBUTEROL SULFATE HFA 108 (90 BASE) MCG/ACT IN AERS: INHALATION_SPRAY | RESPIRATORY_TRACT | 3 refills | Status: DC

## 2020-05-21 MED ORDER — DOXYCYCLINE HYCLATE 100 MG PO TABS: 100.0000 mg | ORAL_TABLET | Freq: Two times a day (BID) | ORAL | 0 refills | Status: DC

## 2020-05-21 MED ORDER — PREDNISONE 10 MG (21) PO TBPK: ORAL_TABLET | ORAL | 0 refills | Status: DC

## 2020-05-21 MED ORDER — BENZONATATE 200 MG PO CAPS: 200.0000 mg | ORAL_CAPSULE | Freq: Two times a day (BID) | ORAL | 0 refills | Status: DC | PRN

## 2020-05-21 NOTE — Progress Notes (Signed)
Virtual Visit via telephone Note Due to COVID-19 pandemic this visit was conducted virtually. This visit type was conducted due to national recommendations for restrictions regarding the COVID-19 Pandemic (e.g. social distancing, sheltering in place) in an effort to limit this patient's exposure and mitigate transmission in our community. All issues noted in this document were discussed and addressed.  A physical exam was not performed with this format.  I connected with Jimmy Park on 05/21/20 at 12:46 pm  by telephone and verified that I am speaking with the correct person using two identifiers. Jimmy Park is currently located at home  and no one is currently with him  during visit. The provider, Evelina Dun, FNP is located in their office at time of visit.  I discussed the limitations, risks, security and privacy concerns of performing an evaluation and management service by telephone and the availability of in person appointments. I also discussed with the patient that there may be a patient responsible charge related to this service. The patient expressed understanding and agreed to proceed.   History and Present Illness:  Pt calls the office today with cough and fever. He has had all three COVID vaccines.  Cough This is a new problem. The current episode started 1 to 4 weeks ago. The problem has been gradually worsening. The problem occurs every few minutes. The cough is non-productive. Associated symptoms include a fever, shortness of breath and wheezing. Pertinent negatives include no chills, ear congestion, ear pain, headaches, myalgias, nasal congestion or postnasal drip. The symptoms are aggravated by lying down. He has tried rest for the symptoms. The treatment provided mild relief. His past medical history is significant for asthma.     Review of Systems  Constitutional: Positive for fever. Negative for chills.  HENT: Negative for ear pain and postnasal drip.   Respiratory:  Positive for cough, shortness of breath and wheezing.   Musculoskeletal: Negative for myalgias.  Neurological: Negative for headaches.     Observations/Objective: No SOB or distress noted, hoarse voice  Assessment and Plan: 1. Moderate persistent asthma with acute exacerbation - doxycycline (VIBRA-TABS) 100 MG tablet; Take 1 tablet (100 mg total) by mouth 2 (two) times daily.  Dispense: 20 tablet; Refill: 0 - predniSONE (STERAPRED UNI-PAK 21 TAB) 10 MG (21) TBPK tablet; Use as directed  Dispense: 21 tablet; Refill: 0 - albuterol (VENTOLIN HFA) 108 (90 Base) MCG/ACT inhaler; TAKE 2 PUFFS BY MOUTH EVERY 6 HOURS AS NEEDED FOR WHEEZE OR SHORTNESS OF BREATH  Dispense: 18 g; Refill: 3 - benzonatate (TESSALON) 200 MG capsule; Take 1 capsule (200 mg total) by mouth 2 (two) times daily as needed for cough.  Dispense: 20 capsule; Refill: 0  2. Mild intermittent asthma without complication - albuterol (VENTOLIN HFA) 108 (90 Base) MCG/ACT inhaler; TAKE 2 PUFFS BY MOUTH EVERY 6 HOURS AS NEEDED FOR WHEEZE OR SHORTNESS OF BREATH  Dispense: 18 g; Refill: 3 - Fluticasone-Salmeterol (ADVAIR DISKUS) 250-50 MCG/DOSE AEPB; Inhale 1 puff into the lungs 2 (two) times daily.  Dispense: 60 each; Refill: 11  Continue Advair Albuterol as needed Start doxycyline and prednisone  Call if symptoms worsen or do not improve     I discussed the assessment and treatment plan with the patient. The patient was provided an opportunity to ask questions and all were answered. The patient agreed with the plan and demonstrated an understanding of the instructions.   The patient was advised to call back or seek an in-person evaluation if the symptoms worsen  or if the condition fails to improve as anticipated.  The above assessment and management plan was discussed with the patient. The patient verbalized understanding of and has agreed to the management plan. Patient is aware to call the clinic if symptoms persist or worsen.  Patient is aware when to return to the clinic for a follow-up visit. Patient educated on when it is appropriate to go to the emergency department.   Time call ended:  12:57 Pm    I provided 11 minutes of non-face-to-face time during this encounter.    Evelina Dun, FNP

## 2020-06-11 ENCOUNTER — Encounter: Payer: Self-pay | Admitting: Family Medicine

## 2020-06-11 DIAGNOSIS — J9801 Acute bronchospasm: Secondary | ICD-10-CM | POA: Diagnosis not present

## 2020-06-11 DIAGNOSIS — R059 Cough, unspecified: Secondary | ICD-10-CM | POA: Diagnosis not present

## 2020-06-11 DIAGNOSIS — M791 Myalgia, unspecified site: Secondary | ICD-10-CM | POA: Diagnosis not present

## 2020-06-11 DIAGNOSIS — Z20822 Contact with and (suspected) exposure to covid-19: Secondary | ICD-10-CM | POA: Diagnosis not present

## 2020-06-14 DIAGNOSIS — J189 Pneumonia, unspecified organism: Secondary | ICD-10-CM | POA: Diagnosis not present

## 2020-06-14 DIAGNOSIS — B029 Zoster without complications: Secondary | ICD-10-CM | POA: Diagnosis not present

## 2020-06-14 DIAGNOSIS — Z20822 Contact with and (suspected) exposure to covid-19: Secondary | ICD-10-CM | POA: Diagnosis not present

## 2020-06-17 ENCOUNTER — Telehealth: Payer: Self-pay

## 2020-06-17 NOTE — Telephone Encounter (Signed)
Since I have no where to put him (My next [facility] clinic is 1/20) Please let him know that he should see one of the other providers at the first [facility] opening available. If they have any concerns about him after, I will be glad to confer with them. WS

## 2020-06-17 NOTE — Telephone Encounter (Signed)
Returning nurse call.

## 2020-06-17 NOTE — Telephone Encounter (Signed)
Appt made for covid clinic

## 2020-06-17 NOTE — Telephone Encounter (Signed)
Called patients wife an the soonest appt with you is 01/18. Per Clinical manager he needs to be seen in [facility] [facility] patient is only wanting to see you and you have nothing I can make an appointment with. Patients has asked to send you a message and ask what you think I tried to put in [place] [facility] with another provider they refused.

## 2020-06-18 ENCOUNTER — Other Ambulatory Visit: Payer: Self-pay | Admitting: Family Medicine

## 2020-06-18 ENCOUNTER — Ambulatory Visit (INDEPENDENT_AMBULATORY_CARE_PROVIDER_SITE_OTHER): Payer: Medicare HMO | Admitting: Nurse Practitioner

## 2020-06-18 ENCOUNTER — Encounter: Payer: Self-pay | Admitting: Nurse Practitioner

## 2020-06-18 ENCOUNTER — Ambulatory Visit (INDEPENDENT_AMBULATORY_CARE_PROVIDER_SITE_OTHER): Payer: Medicare HMO

## 2020-06-18 VITALS — BP 139/80 | HR 83 | Temp 98.6°F | Resp 20 | Ht 67.0 in | Wt 180.0 lb

## 2020-06-18 DIAGNOSIS — B029 Zoster without complications: Secondary | ICD-10-CM

## 2020-06-18 DIAGNOSIS — J189 Pneumonia, unspecified organism: Secondary | ICD-10-CM | POA: Diagnosis not present

## 2020-06-18 DIAGNOSIS — J9811 Atelectasis: Secondary | ICD-10-CM | POA: Diagnosis not present

## 2020-06-18 DIAGNOSIS — J4520 Mild intermittent asthma, uncomplicated: Secondary | ICD-10-CM

## 2020-06-18 MED ORDER — PROMETHAZINE-DM 6.25-15 MG/5ML PO SYRP: 5.0000 mL | ORAL_SOLUTION | Freq: Four times a day (QID) | ORAL | 0 refills | Status: DC | PRN

## 2020-06-18 MED ORDER — BUDESONIDE 0.5 MG/2ML IN SUSP: 0.5000 mg | Freq: Two times a day (BID) | RESPIRATORY_TRACT | 0 refills | Status: DC | PRN

## 2020-06-18 MED ORDER — TRAMADOL HCL 50 MG PO TABS: 50.0000 mg | ORAL_TABLET | Freq: Three times a day (TID) | ORAL | 0 refills | Status: AC | PRN

## 2020-06-18 NOTE — Patient Instructions (Signed)
Shingles  Shingles is an infection. It gives you a painful skin rash and blisters that have fluid in them. Shingles is caused by the same germ (virus) that causes chickenpox. Shingles only happens in people who:  Have had chickenpox.  Have been given a shot of medicine (vaccine) to protect against chickenpox. Shingles is rare in this group. The first symptoms of shingles may be itching, tingling, or pain in an area on your skin. A rash will show on your skin a few days or weeks later. The rash is likely to be on one side of your body. The rash usually has a shape like a belt or a band. Over time, the rash turns into fluid-filled blisters. The blisters will break open, change into scabs, and dry up. Medicines may:  Help with pain and itching.  Help you get better sooner.  Help to prevent long-term problems. Follow these instructions at home: Medicines  Take over-the-counter and prescription medicines only as told by your doctor.  Put on an anti-itch cream or numbing cream where you have a rash, blisters, or scabs. Do this as told by your doctor. Helping with itching and discomfort  Put cold, wet cloths (cold compresses) on the area of the rash or blisters as told by your doctor.  Cool baths can help you feel better. Try adding baking soda or dry oatmeal to the water to lessen itching. Do not bathe in hot water.   Blister and rash care  Keep your rash covered with a loose bandage (dressing).  Wear loose clothing that does not rub on your rash.  Keep your rash and blisters clean. To do this, wash the area with mild soap and cool water as told by your doctor.  Check your rash every day for signs of infection. Check for: ? More redness, swelling, or pain. ? Fluid or blood. ? Warmth. ? Pus or a bad smell.  Do not scratch your rash. Do not pick at your blisters. To help you to not scratch: ? Keep your fingernails clean and cut short. ? Wear gloves or mittens when you sleep, if  scratching is a problem. General instructions  Rest as told by your doctor.  Keep all follow-up visits as told by your doctor. This is important.  Wash your hands often with soap and water. If soap and water are not available, use hand sanitizer. Doing this lowers your chance of getting a skin infection caused by germs (bacteria).  Your infection can cause chickenpox in people who have never had chickenpox or never got a shot of chickenpox vaccine. If you have blisters that did not change into scabs yet, try not to touch other people or be around other people, especially: ? Babies. ? Pregnant women. ? Children who have areas of red, itchy, or rough skin (eczema). ? Very old people who have transplants. ? People who have a long-term (chronic) sickness, like cancer or AIDS. Contact a doctor if:  Your pain does not get better with medicine.  Your pain does not get better after the rash heals.  You have any signs of infection in the rash area. These signs include: ? More redness, swelling, or pain around the rash. ? Fluid or blood coming from the rash. ? The rash area feeling warm to the touch. ? Pus or a bad smell coming from the rash. Get help right away if:  The rash is on your face or nose.  You have pain in your face or pain  by your eye.  You lose feeling on one side of your face.  You have trouble seeing.  You have ear pain, or you have ringing in your ear.  You have a loss of taste.  Your condition gets worse. Summary  Shingles gives you a painful skin rash and blisters that have fluid in them.  Shingles is an infection. It is caused by the same germ (virus) that causes chickenpox.  Keep your rash covered with a loose bandage (dressing). Wear loose clothing that does not rub on your rash.  If you have blisters that did not change into scabs yet, try not to touch other people or be around people. This information is not intended to replace advice given to you by  your health care provider. Make sure you discuss any questions you have with your health care provider. Document Revised: 09/16/2018 Document Reviewed: 01/27/2017 Elsevier Patient Education  2021 [employer].

## 2020-06-18 NOTE — Progress Notes (Signed)
   Subjective:    Patient ID: Jimmy Park, male    DOB: [birth year], 70 y.o.   MRN: [id number]   Chief Complaint: Shortness of Breath   HPI Patient come sin  With his wife with 2 complaints: - Pneumonia- was dx January 4 by urgent care. He was given doxycycline and is almost finished take in. He was also given prednisone. He is still coughing and is slightly productive. - shingles on left side. He os on valtrex. He says pain is getting some better.   Review of Systems  Constitutional: Negative for diaphoresis.  Eyes: Negative for pain.  Respiratory: Positive for cough, shortness of breath and wheezing.   Cardiovascular: Negative for chest pain, palpitations and leg swelling.  Gastrointestinal: Negative for abdominal pain.  Endocrine: Negative for polydipsia.  Skin: Positive for rash (left flank).  Neurological: Negative for dizziness, weakness and headaches.  Hematological: Does not bruise/bleed easily.  All other systems reviewed and are negative.      Objective:   Physical Exam Pulmonary:     Effort: No tachypnea.     Breath sounds: Examination of the right-upper field reveals rhonchi. Examination of the left-upper field reveals rhonchi. Examination of the right-middle field reveals rhonchi. Examination of the left-middle field reveals rhonchi. Examination of the right-lower field reveals rhonchi. Examination of the left-lower field reveals rhonchi. Rhonchi present. No decreased breath sounds.  Skin:    General: Skin is warm.     Findings: Rash (erythematous maculao papaul=ar vesicular lesions on left chest wall wrapping around left flank.) present.     Chest xray- light left lower lobe opacity-Preliminary reading by Ronnald Collum, FNP  [facility]   BP 139/80   Pulse 83   Temp 98.6 F (37 C) (Temporal)   Resp 20   Ht 5\' 7"  (1.702 m)   Wt 180 lb (81.6 kg)   SpO2 94%   BMI 28.19 kg/m      Assessment & Plan:  [name] in today with chief complaint of Shortness of  Breath   1. Pneumonia of left lung due to infectious organism, unspecified part of lung Deep breathes every 2 hours Nebulizer 2x a day as needed - DG Chest 2 View; Future - For home use only DME Nebulizer machine - budesonide (PULMICORT) 0.5 MG/2ML nebulizer solution; Take 2 mLs (0.5 mg total) by nebulization 2 (two) times daily as needed.  Dispense: 60 mL; Refill: 0 - promethazine-dextromethorphan (PROMETHAZINE-DM) 6.25-15 MG/5ML syrup; Take 5 mLs by mouth 4 (four) times daily as needed for cough.  Dispense: 180 mL; Refill: 0  2. Herpes zoster without complication Avoid scratching Pain meds as needed Cool compresses - traMADol (ULTRAM) 50 MG tablet; Take 1 tablet (50 mg total) by mouth every 8 (eight) hours as needed for up to 5 days.  Dispense: 15 tablet; Refill: 0    The above assessment and management plan was discussed with the patient. The patient verbalized understanding of and has agreed to the management plan. Patient is aware to call the clinic if symptoms persist or worsen. Patient is aware when to return to the clinic for a follow-up visit. Patient educated on when it is appropriate to go to the emergency department.   [name] Done, FNP

## 2020-06-19 ENCOUNTER — Other Ambulatory Visit: Payer: Self-pay

## 2020-06-19 ENCOUNTER — Other Ambulatory Visit: Payer: Self-pay | Admitting: Family Medicine

## 2020-06-19 DIAGNOSIS — J4541 Moderate persistent asthma with (acute) exacerbation: Secondary | ICD-10-CM

## 2020-06-19 DIAGNOSIS — J069 Acute upper respiratory infection, unspecified: Secondary | ICD-10-CM

## 2020-06-19 DIAGNOSIS — I10 Essential (primary) hypertension: Secondary | ICD-10-CM

## 2020-06-20 ENCOUNTER — Other Ambulatory Visit: Payer: Self-pay | Admitting: Nurse Practitioner

## 2020-06-20 MED ORDER — SULFAMETHOXAZOLE-TRIMETHOPRIM 800-160 MG PO TABS: 1.0000 | ORAL_TABLET | Freq: Two times a day (BID) | ORAL | 0 refills | Status: DC

## 2020-06-20 NOTE — Progress Notes (Signed)
Sent in antibiotic but changed it to bactrim. Let us know if not improving.

## 2020-06-21 NOTE — Progress Notes (Signed)
They are aware and have already picked it up and started taking it.

## 2020-06-25 ENCOUNTER — Ambulatory Visit: Payer: Medicare HMO | Admitting: Family Medicine

## 2020-07-11 ENCOUNTER — Other Ambulatory Visit: Payer: Self-pay | Admitting: Nurse Practitioner

## 2020-07-11 DIAGNOSIS — J189 Pneumonia, unspecified organism: Secondary | ICD-10-CM

## 2020-07-19 ENCOUNTER — Other Ambulatory Visit: Payer: Self-pay | Admitting: Family Medicine

## 2020-07-19 DIAGNOSIS — J189 Pneumonia, unspecified organism: Secondary | ICD-10-CM

## 2020-08-01 ENCOUNTER — Other Ambulatory Visit: Payer: Self-pay | Admitting: Family Medicine

## 2020-08-01 DIAGNOSIS — E538 Deficiency of other specified B group vitamins: Secondary | ICD-10-CM

## 2020-08-01 DIAGNOSIS — J309 Allergic rhinitis, unspecified: Secondary | ICD-10-CM

## 2020-08-01 DIAGNOSIS — J069 Acute upper respiratory infection, unspecified: Secondary | ICD-10-CM

## 2020-08-01 DIAGNOSIS — J4520 Mild intermittent asthma, uncomplicated: Secondary | ICD-10-CM

## 2020-08-21 ENCOUNTER — Other Ambulatory Visit: Payer: Self-pay | Admitting: Family Medicine

## 2020-08-21 ENCOUNTER — Ambulatory Visit (INDEPENDENT_AMBULATORY_CARE_PROVIDER_SITE_OTHER): Payer: Medicare HMO

## 2020-08-21 ENCOUNTER — Ambulatory Visit (INDEPENDENT_AMBULATORY_CARE_PROVIDER_SITE_OTHER): Payer: Medicare HMO | Admitting: Nurse Practitioner

## 2020-08-21 ENCOUNTER — Encounter: Payer: Self-pay | Admitting: Nurse Practitioner

## 2020-08-21 VITALS — BP 139/87 | HR 67 | Temp 97.5°F | Ht 67.0 in | Wt 180.0 lb

## 2020-08-21 DIAGNOSIS — R0602 Shortness of breath: Secondary | ICD-10-CM | POA: Diagnosis not present

## 2020-08-21 DIAGNOSIS — J189 Pneumonia, unspecified organism: Secondary | ICD-10-CM

## 2020-08-21 DIAGNOSIS — J069 Acute upper respiratory infection, unspecified: Secondary | ICD-10-CM

## 2020-08-21 HISTORY — DX: Pneumonia, unspecified organism: J18.9

## 2020-08-21 HISTORY — DX: Acute upper respiratory infection, unspecified: J06.9

## 2020-08-21 MED ORDER — BENZONATATE 100 MG PO CAPS
100.0000 mg | ORAL_CAPSULE | Freq: Three times a day (TID) | ORAL | 0 refills | Status: DC | PRN
Start: 2020-08-21 — End: 2020-08-27

## 2020-08-21 MED ORDER — AMOXICILLIN-POT CLAVULANATE 875-125 MG PO TABS: 1.0000 | ORAL_TABLET | Freq: Two times a day (BID) | ORAL | 0 refills | Status: DC

## 2020-08-21 MED ORDER — DM-GUAIFENESIN ER 30-600 MG PO TB12: 1.0000 | ORAL_TABLET | Freq: Two times a day (BID) | ORAL | 0 refills | Status: DC

## 2020-08-21 NOTE — Assessment & Plan Note (Signed)
Upper respiratory infection signs and symptoms with cough and congestion not well controlled.  Started patient on guaifenesin, benzonatate for cough, Augmentin 875-125 mg tablet for 7 days. Increase hydration, advised patient to follow-up with worsening unresolved symptoms Rx sent to pharmacy.

## 2020-08-21 NOTE — Patient Instructions (Signed)
Cough, Adult A cough helps to clear your throat and lungs. A cough may be a sign of an illness or another medical condition. An acute cough may only last 2-3 weeks, while a chronic cough may last 8 or more weeks. Many things can cause a cough. They include:  Germs (viruses or bacteria) that attack the airway.  Breathing in things that bother (irritate) your lungs.  Allergies.  Asthma.  Mucus that runs down the back of your throat (postnasal drip).  Smoking.  Acid backing up from the stomach into the tube that moves food from the mouth to the stomach (gastroesophageal reflux).  Some medicines.  Lung problems.  Other medical conditions, such as heart failure or a blood clot in the lung (pulmonary embolism). Follow these instructions at home: Medicines  Take over-the-counter and prescription medicines only as told by your doctor.  Talk with your doctor before you take medicines that stop a cough (cough suppressants). Lifestyle  Do not smoke, and try not to be around smoke. Do not use any products that contain nicotine or tobacco, such as cigarettes, e-cigarettes, and chewing tobacco. If you need help quitting, ask your doctor.  Drink enough fluid to keep your pee (urine) pale yellow.  Avoid caffeine.  Do not drink alcohol if your doctor tells you not to drink.   General instructions  Watch for any changes in your cough. Tell your doctor about them.  Always cover your mouth when you cough.  Stay away from things that make you cough, such as perfume, candles, campfire smoke, or cleaning products.  If the air is dry, use a cool mist vaporizer or humidifier in your home.  If your cough is worse at night, try using extra pillows to raise your head up higher while you sleep.  Rest as needed.  Keep all follow-up visits as told by your doctor. This is important.   Contact a doctor if:  You have new symptoms.  You cough up pus.  Your cough does not get better after 2-3  weeks, or your cough gets worse.  Cough medicine does not help your cough and you are not sleeping well.  You have pain that gets worse or pain that is not helped with medicine.  You have a fever.  You are losing weight and you do not know why.  You have night sweats. Get help right away if:  You cough up blood.  You have trouble breathing.  Your heartbeat is very fast. These symptoms may be an emergency. Do not wait to see if the symptoms will go away. Get medical help right away. Call your local emergency services (911 in the [place] Do not drive yourself to the hospital. Summary  A cough helps to clear your throat and lungs. Many things can cause a cough.  Take over-the-counter and prescription medicines only as told by your doctor.  Always cover your mouth when you cough.  Contact a doctor if you have new symptoms or you have a cough that does not get better or gets worse. This information is not intended to replace advice given to you by your health care provider. Make sure you discuss any questions you have with your health care provider. Document Revised: 07/14/2019 Document Reviewed: 06/13/2018 Elsevier Patient Education  [place] Pneumonia, Adult Pneumonia is an infection of the lungs. It causes irritation and swelling in the airways of the lungs. Mucus and fluid may also build up inside the airways. This may cause  coughing and trouble breathing. One type of pneumonia can happen while you are in a hospital. A different type can happen when you are not in a hospital (community-acquired pneumonia). What are the causes? This condition is caused by germs (viruses, bacteria, or fungi). Some types of germs can spread from person to person. Pneumonia is not thought to spread from person to person.   What increases the risk? You are more likely to develop this condition if:  You have a long-term (chronic) disease, such as: ? Disease of the lungs.  This may be chronic obstructive pulmonary disease (COPD) or asthma. ? Heart failure. ? Cystic fibrosis. ? Diabetes. ? Kidney disease. ? Sickle cell disease. ? HIV.  You have other health problems, such as: ? Your body's defense system (immune system) is weak. ? A condition that may cause you to breathe in fluids from your mouth and nose.  You had your spleen taken out.  You do not take good care of your teeth and mouth (poor dental hygiene).  You use or have used tobacco products.  You travel where the germs that cause this illness are common.  You are near certain animals or the places they live.  You are older than 71 years of age. What are the signs or symptoms? Symptoms of this condition include:  A cough.  A fever.  Sweating or chills.  Chest pain, often when you breathe deeply or cough.  Breathing problems, such as: ? Fast breathing. ? Trouble breathing. ? Shortness of breath.  Feeling tired (fatigued).  Muscle aches. How is this treated? Treatment for this condition depends on many things, such as:  The cause of your illness.  Your medicines.  Your other health problems. Most adults can be treated at home. Sometimes, treatment must happen in a hospital.  Treatment may include medicines to kill germs.  Medicines may depend on which germ caused your illness. Very bad pneumonia is rare. If you get it, you may:  Have a machine to help you breathe.  Have fluid taken away from around your lungs. Follow these instructions at home: Medicines  Take over-the-counter and prescription medicines only as told by your doctor.  Take cough medicine only if you are losing sleep. Cough medicine can keep your body from taking mucus away from your lungs.  If you were prescribed an antibiotic medicine, take it as told by your doctor. Do not stop taking the antibiotic even if you start to feel better. Lifestyle  Do not drink alcohol.  Do not use any products  that contain nicotine or tobacco, such as cigarettes, e-cigarettes, and chewing tobacco. If you need help quitting, ask your doctor.  Eat a healthy diet. This includes a lot of vegetables, fruits, whole grains, low-fat dairy products, and low-fat (lean) protein.      General instructions  Rest a lot. Sleep for at least 8 hours each night.  Sleep with your head and neck raised. Put a few pillows under your head or sleep in a reclining chair.  Return to your normal activities as told by your doctor. Ask your doctor what activities are safe for you.  Drink enough fluid to keep your pee (urine) pale yellow.  If your throat is sore, rinse your mouth often with salt water. To make salt water, dissolve -1 tsp (3-6 g) of salt in 1 cup (237 mL) of warm water.  Keep all follow-up visits as told by your doctor. This is important.   How is  this prevented? You can lower your risk of pneumonia by:  Getting the pneumonia shot (vaccine). These shots have different types and schedules. Ask your doctor what works best for you. Think about getting this shot if: ? You are older than 71 years of age. ? You are 42-15 years of age and:  You are being treated for cancer.  You have long-term lung disease.  You have other problems that affect your body's defense system. Ask your doctor if you have one of these.  Getting your flu shot every year. Ask your doctor which type of shot is best for you.  Going to the dentist as often as told.  Washing your hands often with soap and water for at least 20 seconds. If you cannot use soap and water, use hand sanitizer. Contact a doctor if:  You have a fever.  You lose sleep because your cough medicine does not help. Get help right away if:  You are short of breath and this gets worse.  You have more chest pain.  Your sickness gets worse. This is very serious if: ? You are an older adult. ? Your body's defense system is weak.  You cough up  blood. These symptoms may be an emergency. Do not wait to see if the symptoms will go away. Get medical help right away. Call your local emergency services (911 in the [place] Do not drive yourself to the hospital. Summary  Pneumonia is an infection of the lungs.  Community-acquired pneumonia affects people who have not been in the hospital. Certain germs can cause this infection.  This condition may be treated with medicines that kill germs.  For very bad pneumonia, you may need a hospital stay and treatment to help with breathing. This information is not intended to replace advice given to you by your health care provider. Make sure you discuss any questions you have with your health care provider. Document Revised: 03/07/2019 Document Reviewed: 03/07/2019 Elsevier Patient Education  [place].

## 2020-08-21 NOTE — Progress Notes (Signed)
Acute Office Visit  Subjective:    Patient ID: Jimmy Park, male    DOB: [birth year], 70 y.o.   MRN: [id number]  Chief Complaint  Patient presents with  . Pneumonia    Back in 06/08/20, cough sob     Pneumonia He complains of cough, frequent throat clearing and shortness of breath. This is a recurrent problem. The current episode started in the past 7 days. The problem has been unchanged. The cough is productive of sputum. Pertinent negatives include no ear congestion, fever, headaches or nasal congestion. Associated symptoms comments: Low-grade fever in the last 24 hours. His symptoms are aggravated by nothing (Positive for pneumonia 6 weeks ago). He reports no improvement on treatment. His past medical history is significant for pneumonia.      Past Medical History:  Diagnosis Date  . Allergy   . Anemia   . Asthma   . B12 deficiency   . Cerebral ventriculomegaly   . Gait abnormality   . Hypertension     Past Surgical History:  Procedure Laterality Date  . LUMBAR DRAIN IMPLANTATION      Family History  Problem Relation Age of Onset  . Diabetes Mother   . Hypertension Mother   . Diabetes Father   . Hypertension Father   . Asthma Brother     Social History   Socioeconomic History  . Marital status: Married    Spouse name: [name]  . Number of children: 4  . Years of education: 55  . Highest education level: High school graduate  Occupational History  . Occupation: retired  Tobacco Use  . Smoking status: Former Smoker    Packs/day: 0.50    Types: Cigarettes    Start date: 06/09/1967    Quit date: 06/08/1973    Years since quitting: 47.2  . Smokeless tobacco: Never Used  Vaping Use  . Vaping Use: Never used  Substance and Sexual Activity  . Alcohol use: Not Currently    Alcohol/week: 0.0 standard drinks  . Drug use: No  . Sexual activity: Not Currently  Other Topics Concern  . Not on file  Social History Narrative  . Not on file   Social Determinants  of Health   Financial Resource Strain: Not on file  Food Insecurity: Not on file  Transportation Needs: Not on file  Physical Activity: Not on file  Stress: Not on file  Social Connections: Not on file  Intimate Partner Violence: Not on file    Outpatient Medications Prior to Visit  Medication Sig Dispense Refill  . albuterol (VENTOLIN HFA) 108 (90 Base) MCG/ACT inhaler TAKE 2 PUFFS BY MOUTH EVERY 6 HOURS AS NEEDED FOR WHEEZE OR SHORTNESS OF BREATH 18 g 3  . aspirin 81 MG tablet Take 1 tablet by mouth daily.    . budesonide (PULMICORT) 0.5 MG/2ML nebulizer solution TAKE 2 MLS (0.5 MG TOTAL) BY NEBULIZATION 2 (TWO) TIMES DAILY AS NEEDED. 60 mL 0  . cetirizine (ZYRTEC) 10 MG tablet Take 1 tablet (10 mg total) by mouth daily. 90 tablet 3  . cyanocobalamin (,VITAMIN B-12,) 1000 MCG/ML injection INJECT 1 ML (1,000 MCG TOTAL) INTO THE SKIN EVERY 30 (THIRTY) DAYS. 3 mL 0  . doxycycline (VIBRA-TABS) 100 MG tablet Take 1 tablet (100 mg total) by mouth 2 (two) times daily. 20 tablet 0  . fexofenadine (ALLEGRA) 180 MG tablet TAKE 1 TABLET BY MOUTH EVERY DAY 90 tablet 1  . Fluticasone-Salmeterol (ADVAIR DISKUS) 250-50 MCG/DOSE AEPB Inhale 1 puff into the  lungs 2 (two) times daily. 60 each 11  . folic acid (FOLVITE) 1 MG tablet Take 1 tablet by mouth daily.    [name] ibuprofen (ADVIL,MOTRIN) 400 MG tablet Take 1 tablet by mouth 2 (two) times daily as needed.    . metoprolol tartrate (LOPRESSOR) 25 MG tablet TAKE 1 TABLET BY MOUTH EVERY DAY 90 tablet 1  . montelukast (SINGULAIR) 10 MG tablet TAKE 1 TABLET BY MOUTH EVERY DAY 90 tablet 0  . Omega-3 Fatty Acids (FISH OIL) 1000 MG CAPS Take by mouth.    [name] omeprazole (PRILOSEC) 40 MG capsule TAKE 1 CAPSULE BY MOUTH EVERY DAY 90 capsule 1  . predniSONE (STERAPRED UNI-PAK 21 TAB) 10 MG (21) TBPK tablet Use as directed 21 tablet 0  . promethazine-dextromethorphan (PROMETHAZINE-DM) 6.25-15 MG/5ML syrup Take 5 mLs by mouth 4 (four) times daily as needed for cough.  180 mL 0  . sulfamethoxazole-trimethoprim (BACTRIM DS) 800-160 MG tablet Take 1 tablet by mouth 2 (two) times daily. 14 tablet 0  . valsartan (DIOVAN) 320 MG tablet TAKE 1 TABLET BY MOUTH EVERY DAY 90 tablet 1   No facility-administered medications prior to visit.    No Known Allergies  Review of Systems  Constitutional: Negative.  Negative for fever.  HENT: Negative.   Respiratory: Positive for cough and shortness of breath.   Cardiovascular: Negative.   Gastrointestinal: Negative.   Genitourinary: Negative.   Musculoskeletal: Negative.   Neurological: Negative.  Negative for headaches.  All other systems reviewed and are negative.      Objective:    Physical Exam Vitals reviewed.  Constitutional:      Appearance: Normal appearance.  HENT:     Head: Normocephalic.     Nose: Congestion present.  Eyes:     Conjunctiva/sclera: Conjunctivae normal.  Cardiovascular:     Rate and Rhythm: Normal rate and regular rhythm.     Pulses: Normal pulses.     Heart sounds: Normal heart sounds.  Pulmonary:     Effort: Pulmonary effort is normal.     Breath sounds: Normal breath sounds.     Comments: Cough Abdominal:     General: Bowel sounds are normal.  Skin:    General: Skin is dry.  Neurological:     Mental Status: He is alert and oriented to person, place, and time.  Psychiatric:        Mood and Affect: Mood normal.        Behavior: Behavior normal.     BP 139/87   Pulse 67   Temp (!) 97.5 F (36.4 C) (Temporal)   Ht 5\' 7"  (1.702 m)   Wt 180 lb (81.6 kg)   SpO2 97%   BMI 28.19 kg/m  Wt Readings from Last 3 Encounters:  06/18/20 180 lb (81.6 kg)  04/10/20 180 lb 8 oz (81.9 kg)  08/09/18 187 lb (84.8 kg)      Lab Results  Component Value Date   TSH 3.490 08/09/2018   Lab Results  Component Value Date   WBC 5.1 08/09/2018   HGB 15.3 08/09/2018   HCT 45.3 08/09/2018   MCV 94 08/09/2018   PLT 211 08/09/2018   Lab Results  Component Value Date   NA  140 08/09/2018   K 4.8 08/09/2018   CO2 22 08/09/2018   GLUCOSE 124 (H) 08/09/2018   BUN 14 08/09/2018   CREATININE 0.96 08/09/2018   BILITOT 0.5 08/09/2018   ALKPHOS 106 08/09/2018   AST 77 (H) 08/09/2018  ALT 95 (H) 08/09/2018   PROT 7.4 08/09/2018   ALBUMIN 4.7 08/09/2018   CALCIUM 9.7 08/09/2018   Lab Results  Component Value Date   CHOL 189 02/08/2018   Lab Results  Component Value Date   HDL 44 02/08/2018   Lab Results  Component Value Date   LDLCALC 127 (H) 02/08/2018   Lab Results  Component Value Date   TRIG 91 02/08/2018   Lab Results  Component Value Date   CHOLHDL 4.3 02/08/2018   Lab Results  Component Value Date   HGBA1C 5.6 08/06/2017       Assessment & Plan:   Problem List Items Addressed This Visit      Respiratory   URI with cough and congestion    Upper respiratory infection signs and symptoms with cough and congestion not well controlled.  Started patient on guaifenesin, benzonatate for cough, Augmentin 875-125 mg tablet for 7 days. Increase hydration, advised patient to follow-up with worsening unresolved symptoms Rx sent to pharmacy.      Relevant Medications   amoxicillin-clavulanate (AUGMENTIN) 875-125 MG tablet   dextromethorphan-guaiFENesin (MUCINEX DM) 30-600 MG 12hr tablet   benzonatate (TESSALON PERLES) 100 MG capsule   Pneumonia of left lung due to infectious organism - Primary    Follow-up chest x-ray after positive pneumonia 6 weeks ago.  X-ray completed results pending. Patient is reporting recurrent shortness of breath, cough and chest tightness.  On assessment patient oxygen saturation changed from 97% to 95% after walking. Low-grade fever in the last 24 hours.  Patient is afebrile in clinic, no nausea, vomiting or chills present. Education provided to patient with printed handouts given.  Increase hydration.  Follow-up for worsening unresolved symptoms.       Relevant Medications   amoxicillin-clavulanate  (AUGMENTIN) 875-125 MG tablet   dextromethorphan-guaiFENesin (MUCINEX DM) 30-600 MG 12hr tablet   benzonatate (TESSALON PERLES) 100 MG capsule   Other Relevant Orders   DG Chest 2 View       Meds ordered this encounter  Medications  . amoxicillin-clavulanate (AUGMENTIN) 875-125 MG tablet    Sig: Take 1 tablet by mouth 2 (two) times daily.    Dispense:  14 tablet    Refill:  0    Order Specific Question:   Supervising Provider    Answer:   Janora Norlander [[id number]]  . dextromethorphan-guaiFENesin (MUCINEX DM) 30-600 MG 12hr tablet    Sig: Take 1 tablet by mouth 2 (two) times daily.    Dispense:  30 tablet    Refill:  0    Order Specific Question:   Supervising Provider    Answer:   Janora Norlander [[id number]]  . benzonatate (TESSALON PERLES) 100 MG capsule    Sig: Take 1 capsule (100 mg total) by mouth 3 (three) times daily as needed for cough.    Dispense:  30 capsule    Refill:  0    Order Specific Question:   Supervising Provider    Answer:   Janora Norlander [[id number]]     Ivy Lynn, NP

## 2020-08-21 NOTE — Assessment & Plan Note (Signed)
Follow-up chest x-ray after positive pneumonia 6 weeks ago.  X-ray completed results pending. Patient is reporting recurrent shortness of breath, cough and chest tightness.  On assessment patient oxygen saturation changed from 97% to 95% after walking. Low-grade fever in the last 24 hours.  Patient is afebrile in clinic, no nausea, vomiting or chills present. Education provided to patient with printed handouts given.  Increase hydration.  Follow-up for worsening unresolved symptoms.

## 2020-08-27 ENCOUNTER — Encounter: Payer: Self-pay | Admitting: Nurse Practitioner

## 2020-08-27 ENCOUNTER — Ambulatory Visit (INDEPENDENT_AMBULATORY_CARE_PROVIDER_SITE_OTHER): Payer: Medicare HMO | Admitting: Nurse Practitioner

## 2020-08-27 DIAGNOSIS — J069 Acute upper respiratory infection, unspecified: Secondary | ICD-10-CM | POA: Diagnosis not present

## 2020-08-27 MED ORDER — BENZONATATE 100 MG PO CAPS: 100.0000 mg | ORAL_CAPSULE | Freq: Three times a day (TID) | ORAL | 0 refills | Status: DC | PRN

## 2020-08-27 MED ORDER — DM-GUAIFENESIN ER 30-600 MG PO TB12: 1.0000 | ORAL_TABLET | Freq: Two times a day (BID) | ORAL | 0 refills | Status: DC

## 2020-08-27 MED ORDER — PREDNISONE 10 MG (21) PO TBPK: ORAL_TABLET | ORAL | 0 refills | Status: DC

## 2020-08-27 NOTE — Patient Instructions (Signed)

## 2020-08-27 NOTE — Assessment & Plan Note (Signed)
Patient continues to experience cough with sputum production.  Symptoms present since 08/21/2020 patient has used guaifenesin and benzonatate with moderate effect.  Patient is not reporting shortness of breath, oxygen saturation on room air 96%, patient denies fever chills, nausea or vomiting.  Patient is reporting slight right-sided pain which is not new.  Patient completed antibiotic therapy today but continues to report fatigue.  Advised patient to seek emergency care with increased or worsening symptoms, and fever.  Spoke to patient's wife explaining chest x-rays from 08/21/2020.  Patient is scheduled for repeat chest x-ray in 4 to 5 weeks to rule out pneumonia.   Started patient on prednisone taper, refilled guaifenesin and benzonatate for cough and congestion. Rx sent to pharmacy Education provided to patient/spouse in the both verbalized understanding.

## 2020-08-27 NOTE — Progress Notes (Signed)
   Virtual Visit via telephone Note Due to COVID-19 pandemic this visit was conducted virtually. This visit type was conducted due to national recommendations for restrictions regarding the COVID-19 Pandemic (e.g. social distancing, sheltering in place) in an effort to limit this patient's exposure and mitigate transmission in our community. All issues noted in this document were discussed and addressed.  A physical exam was not performed with this format.  I connected with Jimmy Park on 08/27/20 at  10:30 AM by telephone and verified that I am speaking with the correct person using two identifiers. Jimmy Park is currently located at home and spouse is currently with patient during visit. The provider, Ivy Lynn, NP is located in their office at time of visit.  I discussed the limitations, risks, security and privacy concerns of performing an evaluation and management service by telephone and the availability of in person appointments. I also discussed with the patient that there may be a patient responsible charge related to this service. The patient expressed understanding and agreed to proceed.   History and Present Illness:  HPI    Review of Systems  Constitutional: Negative for chills and fever.  HENT: Negative.   Respiratory: Positive for cough and sputum production. Negative for shortness of breath and wheezing.   Cardiovascular: Negative.   Musculoskeletal: Negative.   Skin: Negative.   All other systems reviewed and are negative.    Observations/Objective: Telephone visit patient did not sound to be in distress. Assessment and Plan:   URI with cough and congestion Patient continues to experience cough with sputum production.  Symptoms present since 08/21/2020 patient has used guaifenesin and benzonatate with moderate effect.  Patient is not reporting shortness of breath, oxygen saturation on room air 96%, patient denies fever chills, nausea or vomiting.  Patient is  reporting slight right-sided pain which is not new.  Patient completed antibiotic therapy today but continues to report fatigue.  Advised patient to seek emergency care with increased or worsening symptoms, and fever.  Spoke to patient's wife explaining chest x-rays from 08/21/2020.  Patient is scheduled for repeat chest x-ray in 4 to 5 weeks to rule out pneumonia.   Started patient on prednisone taper, refilled guaifenesin and benzonatate for cough and congestion. Rx sent to pharmacy Education provided to patient/spouse in the both verbalized understanding.   Follow Up Instructions: Advised patient to follow-up with worsening unresolved symptoms.    I discussed the assessment and treatment plan with the patient. The patient was provided an opportunity to ask questions and all were answered. The patient agreed with the plan and demonstrated an understanding of the instructions.   The patient was advised to call back or seek an in-person evaluation if the symptoms worsen or if the condition fails to improve as anticipated.  The above assessment and management plan was discussed with the patient. The patient verbalized understanding of and has agreed to the management plan. Patient is aware to call the clinic if symptoms persist or worsen. Patient is aware when to return to the clinic for a follow-up visit. Patient educated on when it is appropriate to go to the emergency department.   Time call ended: 10:42 AM  I provided 12 minutes of non-face-to-face time during this encounter.    Ivy Lynn, NP

## 2020-09-03 ENCOUNTER — Other Ambulatory Visit: Payer: Self-pay | Admitting: Family Medicine

## 2020-09-03 DIAGNOSIS — J189 Pneumonia, unspecified organism: Secondary | ICD-10-CM

## 2020-09-05 ENCOUNTER — Encounter: Payer: Self-pay | Admitting: Family Medicine

## 2020-09-05 ENCOUNTER — Ambulatory Visit (INDEPENDENT_AMBULATORY_CARE_PROVIDER_SITE_OTHER): Payer: Medicare HMO | Admitting: Family Medicine

## 2020-09-05 DIAGNOSIS — J189 Pneumonia, unspecified organism: Secondary | ICD-10-CM

## 2020-09-05 MED ORDER — MOXIFLOXACIN HCL 400 MG PO TABS: 400.0000 mg | ORAL_TABLET | Freq: Every day | ORAL | 0 refills | Status: DC

## 2020-09-05 MED ORDER — PREDNISONE 10 MG PO TABS: ORAL_TABLET | ORAL | 0 refills | Status: DC

## 2020-09-05 MED ORDER — PROMETHAZINE-DM 6.25-15 MG/5ML PO SYRP
5.0000 mL | ORAL_SOLUTION | Freq: Four times a day (QID) | ORAL | 0 refills | Status: DC | PRN
Start: 2020-09-05 — End: 2020-10-08

## 2020-09-05 NOTE — Progress Notes (Signed)
Subjective:    Patient ID: Jimmy Park, male    DOB: 10/01/1950, 70 y.o.   MRN: [id number]   HPI: Jimmy Park is a 70 y.o. male presenting for pains in the back and the left side. Can't get my breath anymore. Dyspneic with just a few steps.Bad cough. Worse with laying down.Intermittent left ear pain. Denies fever.    Depression screen [facility] 2/9 04/10/2020 02/23/2020 02/09/2019 08/09/2018 02/08/2018  Decreased Interest 0 0 0 0 0  Down, Depressed, Hopeless 0 0 0 0 0  PHQ - 2 Score 0 0 0 0 0     Relevant past medical, surgical, family and social history reviewed and updated as indicated.  Interim medical history since our last visit reviewed. Allergies and medications reviewed and updated.  ROS:  Review of Systems  Constitutional: Positive for fatigue. Negative for fever.  Respiratory: Positive for cough and shortness of breath.   Cardiovascular: Negative for chest pain.  Musculoskeletal: Positive for back pain (left side - thoracic).  Skin: Negative for rash.     Social History   Tobacco Use  Smoking Status Former Smoker  . Packs/day: 0.50  . Types: Cigarettes  . Start date: 06/09/1967  . Quit date: 06/08/1973  . Years since quitting: 47.2  Smokeless Tobacco Never Used       Objective:     Wt Readings from Last 3 Encounters:  08/21/20 180 lb (81.6 kg)  06/18/20 180 lb (81.6 kg)  04/10/20 180 lb 8 oz (81.9 kg)     Exam deferred. Pt. Harboring due to COVID 19. Phone visit performed.   Assessment & Plan:   1. Pneumonia of left lung due to infectious organism, unspecified part of lung     Meds ordered this encounter  Medications  . predniSONE (DELTASONE) 10 MG tablet    Sig: Take 5 daily for 3 days followed by 4,3,2 and 1 for 3 days each.    Dispense:  45 tablet    Refill:  0  . moxifloxacin (AVELOX) 400 MG tablet    Sig: Take 1 tablet (400 mg total) by mouth daily. Take all of these, for infection    Dispense:  10 tablet    Refill:  0  .  promethazine-dextromethorphan (PROMETHAZINE-DM) 6.25-15 MG/5ML syrup    Sig: Take 5 mLs by mouth 4 (four) times daily as needed for cough.    Dispense:  180 mL    Refill:  0        Diagnoses and all orders for this visit:  Pneumonia of left lung due to infectious organism, unspecified part of lung -     promethazine-dextromethorphan (PROMETHAZINE-DM) 6.25-15 MG/5ML syrup; Take 5 mLs by mouth 4 (four) times daily as needed for cough.  Other orders -     predniSONE (DELTASONE) 10 MG tablet; Take 5 daily for 3 days followed by 4,3,2 and 1 for 3 days each. -     moxifloxacin (AVELOX) 400 MG tablet; Take 1 tablet (400 mg total) by mouth daily. Take all of these, for infection    Virtual Visit via telephone Note  I discussed the limitations, risks, security and privacy concerns of performing an evaluation and management service by telephone and the availability of in person appointments. The patient was identified with two identifiers. Pt.expressed understanding and agreed to proceed. Pt. Is at home. Dr. Livia Snellen is in his office.  Follow Up Instructions:   I discussed the assessment and treatment plan with the patient. The patient was  provided an opportunity to ask questions and all were answered. The patient agreed with the plan and demonstrated an understanding of the instructions.   The patient was advised to call back or seek an in-person evaluation if the symptoms worsen or if the condition fails to improve as anticipated.   Total minutes including chart review and phone contact time: 21   Follow up plan: No follow-ups on file.  Claretta Fraise, MD [place]

## 2020-09-06 ENCOUNTER — Other Ambulatory Visit: Payer: Self-pay | Admitting: Family Medicine

## 2020-09-06 DIAGNOSIS — J189 Pneumonia, unspecified organism: Secondary | ICD-10-CM

## 2020-09-24 ENCOUNTER — Encounter: Payer: Self-pay | Admitting: Family Medicine

## 2020-10-08 ENCOUNTER — Ambulatory Visit (INDEPENDENT_AMBULATORY_CARE_PROVIDER_SITE_OTHER): Payer: Medicare HMO | Admitting: Family Medicine

## 2020-10-08 ENCOUNTER — Other Ambulatory Visit: Payer: Self-pay

## 2020-10-08 ENCOUNTER — Encounter: Payer: Self-pay | Admitting: Family Medicine

## 2020-10-08 VITALS — BP 123/73 | HR 65 | Temp 97.7°F | Ht 67.0 in | Wt 179.4 lb

## 2020-10-08 DIAGNOSIS — K219 Gastro-esophageal reflux disease without esophagitis: Secondary | ICD-10-CM

## 2020-10-08 DIAGNOSIS — I10 Essential (primary) hypertension: Secondary | ICD-10-CM | POA: Diagnosis not present

## 2020-10-08 DIAGNOSIS — E782 Mixed hyperlipidemia: Secondary | ICD-10-CM | POA: Diagnosis not present

## 2020-10-08 DIAGNOSIS — I421 Obstructive hypertrophic cardiomyopathy: Secondary | ICD-10-CM

## 2020-10-08 DIAGNOSIS — G9389 Other specified disorders of brain: Secondary | ICD-10-CM | POA: Diagnosis not present

## 2020-10-08 DIAGNOSIS — E538 Deficiency of other specified B group vitamins: Secondary | ICD-10-CM

## 2020-10-08 DIAGNOSIS — Z0000 Encounter for general adult medical examination without abnormal findings: Secondary | ICD-10-CM | POA: Diagnosis not present

## 2020-10-08 DIAGNOSIS — R42 Dizziness and giddiness: Secondary | ICD-10-CM

## 2020-10-08 DIAGNOSIS — Z125 Encounter for screening for malignant neoplasm of prostate: Secondary | ICD-10-CM | POA: Diagnosis not present

## 2020-10-08 DIAGNOSIS — Z0001 Encounter for general adult medical examination with abnormal findings: Secondary | ICD-10-CM

## 2020-10-08 DIAGNOSIS — E559 Vitamin D deficiency, unspecified: Secondary | ICD-10-CM | POA: Diagnosis not present

## 2020-10-08 LAB — URINALYSIS
Bilirubin, UA: NEGATIVE
Ketones, UA: NEGATIVE
Leukocytes,UA: NEGATIVE
Nitrite, UA: NEGATIVE
Protein,UA: NEGATIVE
RBC, UA: NEGATIVE
Specific Gravity, UA: 1.02 (ref 1.005–1.030)
Urobilinogen, Ur: 1 mg/dL (ref 0.2–1.0)
pH, UA: 7 (ref 5.0–7.5)

## 2020-10-08 NOTE — Progress Notes (Signed)
Subjective:  Patient ID: Jimmy Park, male    DOB: 05-Oct-1950  Age: 70 y.o. MRN: [id number]  CC: Annual Exam   HPI Jimmy Park presents for Complete physical.  Reports that he has been a little bit dizzy for a while.  He says that he gets lightheaded when he stands up.  This lasts approximately a minute.  He has not had any passing out spells.  No chest pain or shortness of breath associated.  No breaking out in sweats.  Patient reports sneezing and coughing but no dyspnea.  He is having some pain in the ears still.  He has not had any hearing changes.  He does have some sense of fluttering in his ears.  Patient also has vitamin B12 deficiency for which he takes injections his wife is giving those to him.   Follow-up of hypertension. Patient has no history of headache chest pain or shortness of breath or recent cough. Patient also denies symptoms of TIA such as numbness weakness lateralizing. Patient checks  blood pressure at home and has not had any elevated readings recently. Patient denies side effects from his medication. States taking it regularly.    Depression screen [facility] 2/9 10/08/2020 04/10/2020 02/23/2020  Decreased Interest 0 0 0  Down, Depressed, Hopeless 0 0 0  PHQ - 2 Score 0 0 0    History Jimmy Park has a past medical history of Allergy, Anemia, Asthma, B12 deficiency, Cerebral ventriculomegaly, Gait abnormality, and Hypertension.   He has a past surgical history that includes Lumbar drain implantation.   His family history includes Asthma in his brother; Diabetes in his father and mother; Hypertension in his father and mother.He reports that he quit smoking about 47 years ago. His smoking use included cigarettes. He started smoking about 53 years ago. He smoked 0.50 packs per day. He has never used smokeless tobacco. He reports previous alcohol use. He reports that he does not use drugs.    ROS Review of Systems  Constitutional: Negative for activity change, fatigue and  unexpected weight change.  HENT: Positive for congestion and ear pain. Negative for hearing loss, postnasal drip and trouble swallowing.   Eyes: Negative for pain and visual disturbance.  Respiratory: Negative for cough, chest tightness and shortness of breath.   Cardiovascular: Negative for chest pain, palpitations and leg swelling.  Gastrointestinal: Negative for abdominal distention, abdominal pain, blood in stool, constipation, diarrhea, nausea and vomiting.  Endocrine: Negative for cold intolerance, heat intolerance and polydipsia.  Genitourinary: Negative for difficulty urinating, dysuria, flank pain, frequency and urgency.  Musculoskeletal: Negative for arthralgias and joint swelling.  Skin: Negative for color change, rash and wound.  Neurological: Positive for dizziness. Negative for syncope, speech difficulty, weakness, light-headedness, numbness and headaches.  Hematological: Does not bruise/bleed easily.  Psychiatric/Behavioral: Negative for confusion, decreased concentration, dysphoric mood and sleep disturbance. The patient is not nervous/anxious.     Objective:  BP 123/73   Pulse 65   Temp 97.7 F (36.5 C)   Ht 5\' 7"  (1.702 m)   Wt 179 lb 6.4 oz (81.4 kg)   SpO2 97%   BMI 28.10 kg/m   BP Readings from Last 3 Encounters:  10/08/20 123/73  08/21/20 139/87  06/18/20 139/80    Wt Readings from Last 3 Encounters:  10/08/20 179 lb 6.4 oz (81.4 kg)  08/21/20 180 lb (81.6 kg)  06/18/20 180 lb (81.6 kg)     Physical Exam Constitutional:      Appearance: He is well-developed.  HENT:     Head: Normocephalic and atraumatic.  Eyes:     Pupils: Pupils are equal, round, and reactive to light.  Neck:     Thyroid: No thyromegaly.     Trachea: No tracheal deviation.  Cardiovascular:     Rate and Rhythm: Normal rate and regular rhythm.     Heart sounds: Normal heart sounds. No murmur heard. No friction rub. No gallop.   Pulmonary:     Breath sounds: Normal breath  sounds. No wheezing or rales.  Abdominal:     General: Bowel sounds are normal. There is no distension.     Palpations: Abdomen is soft. There is no mass.     Tenderness: There is no abdominal tenderness.     Hernia: There is no hernia in the left inguinal area.  Genitourinary:    Penis: Normal.      Testes: Normal.  Musculoskeletal:        General: Normal range of motion.     Cervical back: Normal range of motion.  Lymphadenopathy:     Cervical: No cervical adenopathy.  Skin:    General: Skin is warm and dry.  Neurological:     Mental Status: He is alert and oriented to person, place, and time.       Assessment & Plan:   Jimmy Park was seen today for annual exam.  Diagnoses and all orders for this visit:  Physical exam -     EKG 12-Lead -     CBC with Differential/Platelet -     CMP14+EGFR -     PSA, total and free -     Urinalysis  Vitamin B12 deficiency -     CBC with Differential/Platelet -     CMP14+EGFR -     Vitamin B12  Benign essential HTN -     EKG 12-Lead -     CBC with Differential/Platelet -     CMP14+EGFR -     Urinalysis  Cardiomyopathy, hypertrophic obstructive (HCC) -     EKG 12-Lead -     CBC with Differential/Platelet -     CMP14+EGFR  Cerebral ventriculomegaly -     CBC with Differential/Platelet -     CMP14+EGFR  Gastroesophageal reflux disease without esophagitis -     CBC with Differential/Platelet -     CMP14+EGFR  Screening for prostate cancer -     CBC with Differential/Platelet -     CMP14+EGFR -     PSA, total and free  Vitamin D deficiency -     CBC with Differential/Platelet -     CMP14+EGFR -     VITAMIN D 25 Hydroxy (Vit-D Deficiency, Fractures)  Mixed hyperlipidemia -     CMP14+EGFR -     Lipid panel  Dizziness -     EKG 12-Lead       I have discontinued Jimmy Park's benzonatate, predniSONE, moxifloxacin, and promethazine-dextromethorphan. I am also having him maintain his aspirin, folic acid, ibuprofen,  Fish Oil, albuterol, Fluticasone-Salmeterol, omeprazole, metoprolol tartrate, valsartan, montelukast, cyanocobalamin, fexofenadine, and budesonide.  Allergies as of 10/08/2020   No Known Allergies     Medication List       Accurate as of Oct 08, 2020  9:28 PM. If you have any questions, ask your nurse or doctor.        STOP taking these medications   benzonatate 100 MG capsule Commonly known as: Best boy Stopped by: Claretta Fraise, MD   moxifloxacin 400 MG tablet Commonly  known as: Avelox Stopped by: Claretta Fraise, MD   predniSONE 10 MG tablet Commonly known as: DELTASONE Stopped by: Claretta Fraise, MD   promethazine-dextromethorphan 6.25-15 MG/5ML syrup Commonly known as: PROMETHAZINE-DM Stopped by: Claretta Fraise, MD     TAKE these medications   albuterol 108 (90 Base) MCG/ACT inhaler Commonly known as: VENTOLIN HFA TAKE 2 PUFFS BY MOUTH EVERY 6 HOURS AS NEEDED FOR WHEEZE OR SHORTNESS OF BREATH   aspirin 81 MG tablet Take 1 tablet by mouth daily.   budesonide 0.5 MG/2ML nebulizer solution Commonly known as: PULMICORT TAKE 2 MLS (0.5 MG TOTAL) BY NEBULIZATION 2 (TWO) TIMES DAILY AS NEEDED.   cyanocobalamin 1000 MCG/ML injection Commonly known as: (VITAMIN B-12) INJECT 1 ML (1,000 MCG TOTAL) INTO THE SKIN EVERY 30 (THIRTY) DAYS.   fexofenadine 180 MG tablet Commonly known as: ALLEGRA TAKE 1 TABLET BY MOUTH EVERY DAY   Fish Oil 1000 MG Caps Take by mouth.   Fluticasone-Salmeterol 250-50 MCG/DOSE Aepb Commonly known as: Advair Diskus Inhale 1 puff into the lungs 2 (two) times daily.   folic acid 1 MG tablet Commonly known as: FOLVITE Take 1 tablet by mouth daily.   ibuprofen 400 MG tablet Commonly known as: ADVIL Take 1 tablet by mouth 2 (two) times daily as needed.   metoprolol tartrate 25 MG tablet Commonly known as: LOPRESSOR TAKE 1 TABLET BY MOUTH EVERY DAY   montelukast 10 MG tablet Commonly known as: SINGULAIR TAKE 1 TABLET BY MOUTH EVERY  DAY   omeprazole 40 MG capsule Commonly known as: PRILOSEC TAKE 1 CAPSULE BY MOUTH EVERY DAY   valsartan 320 MG tablet Commonly known as: DIOVAN TAKE 1 TABLET BY MOUTH EVERY DAY        Follow-up: Return in about 6 months (around 04/10/2021), or if symptoms worsen or fail to improve.  Claretta Fraise, M.D.

## 2020-10-09 LAB — CBC WITH DIFFERENTIAL/PLATELET
Basophils Absolute: 0 10*3/uL (ref 0.0–0.2)
Basos: 1 %
EOS (ABSOLUTE): 0.1 10*3/uL (ref 0.0–0.4)
Eos: 2 %
Hematocrit: 45.5 % (ref 37.5–51.0)
Hemoglobin: 15.6 g/dL (ref 13.0–17.7)
Immature Grans (Abs): 0 10*3/uL (ref 0.0–0.1)
Immature Granulocytes: 0 %
Lymphocytes Absolute: 1.9 10*3/uL (ref 0.7–3.1)
Lymphs: 33 %
MCH: 32.5 pg (ref 26.6–33.0)
MCHC: 34.3 g/dL (ref 31.5–35.7)
MCV: 95 fL (ref 79–97)
Monocytes Absolute: 0.5 10*3/uL (ref 0.1–0.9)
Monocytes: 8 %
Neutrophils Absolute: 3.2 10*3/uL (ref 1.4–7.0)
Neutrophils: 56 %
Platelets: 215 10*3/uL (ref 150–450)
RBC: 4.8 x10E6/uL (ref 4.14–5.80)
RDW: 11.8 % (ref 11.6–15.4)
WBC: 5.7 10*3/uL (ref 3.4–10.8)

## 2020-10-09 LAB — VITAMIN B12: Vitamin B-12: 887 pg/mL (ref 232–1245)

## 2020-10-09 LAB — LIPID PANEL
Chol/HDL Ratio: 5.2 ratio — ABNORMAL HIGH (ref 0.0–5.0)
Cholesterol, Total: 199 mg/dL (ref 100–199)
HDL: 38 mg/dL — ABNORMAL LOW (ref >39)
LDL Chol Calc (NIH): 139 mg/dL — ABNORMAL HIGH (ref 0–99)
Triglycerides: 123 mg/dL (ref 0–149)
VLDL Cholesterol Cal: 22 mg/dL (ref 5–40)

## 2020-10-09 LAB — CMP14+EGFR
ALT: 45 IU/L — ABNORMAL HIGH (ref 0–44)
AST: 35 IU/L (ref 0–40)
Albumin/Globulin Ratio: 1.7 (ref 1.2–2.2)
Albumin: 4.7 g/dL (ref 3.8–4.8)
Alkaline Phosphatase: 102 IU/L (ref 44–121)
BUN/Creatinine Ratio: 24 (ref 10–24)
BUN: 21 mg/dL (ref 8–27)
Bilirubin Total: 0.6 mg/dL (ref 0.0–1.2)
CO2: 22 mmol/L (ref 20–29)
Calcium: 9.6 mg/dL (ref 8.6–10.2)
Chloride: 101 mmol/L (ref 96–106)
Creatinine, Ser: 0.88 mg/dL (ref 0.76–1.27)
Globulin, Total: 2.7 g/dL (ref 1.5–4.5)
Glucose: 149 mg/dL — ABNORMAL HIGH (ref 65–99)
Potassium: 5.1 mmol/L (ref 3.5–5.2)
Sodium: 142 mmol/L (ref 134–144)
Total Protein: 7.4 g/dL (ref 6.0–8.5)
eGFR: 93 mL/min/{1.73_m2} (ref >59)

## 2020-10-09 LAB — PSA, TOTAL AND FREE
PSA, Free Pct: 26.7 %
PSA, Free: 0.08 ng/mL
Prostate Specific Ag, Serum: 0.3 ng/mL (ref 0.0–4.0)

## 2020-10-09 LAB — VITAMIN D 25 HYDROXY (VIT D DEFICIENCY, FRACTURES): Vit D, 25-Hydroxy: 85.8 ng/mL (ref 30.0–100.0)

## 2020-11-14 ENCOUNTER — Other Ambulatory Visit: Payer: Self-pay | Admitting: Family Medicine

## 2020-11-14 DIAGNOSIS — J4520 Mild intermittent asthma, uncomplicated: Secondary | ICD-10-CM

## 2020-11-14 DIAGNOSIS — J309 Allergic rhinitis, unspecified: Secondary | ICD-10-CM

## 2020-11-17 ENCOUNTER — Encounter: Payer: Self-pay | Admitting: Family Medicine

## 2020-11-18 ENCOUNTER — Encounter: Payer: Self-pay | Admitting: Family

## 2020-11-18 ENCOUNTER — Ambulatory Visit (INDEPENDENT_AMBULATORY_CARE_PROVIDER_SITE_OTHER): Payer: Medicare HMO | Admitting: Family

## 2020-11-18 ENCOUNTER — Encounter: Payer: Self-pay | Admitting: Family Medicine

## 2020-11-18 DIAGNOSIS — U071 COVID-19: Secondary | ICD-10-CM

## 2020-11-18 MED ORDER — MOLNUPIRAVIR EUA 200MG CAPSULE: 4.0000 | ORAL_CAPSULE | Freq: Two times a day (BID) | ORAL | 0 refills | Status: AC

## 2020-11-18 MED ORDER — FLUTICASONE PROPIONATE 50 MCG/ACT NA SUSP: 2.0000 | Freq: Every day | NASAL | 6 refills | Status: DC

## 2020-11-18 NOTE — Telephone Encounter (Signed)
Spoke with patient and appointment given.

## 2020-11-18 NOTE — Progress Notes (Signed)
Virtual Visit  Note Due to COVID-19 pandemic this visit was conducted virtually. This visit type was conducted due to national recommendations for restrictions regarding the COVID-19 Pandemic (e.g. social distancing, sheltering in place) in an effort to limit this patient's exposure and mitigate transmission in our community. All issues noted in this document were discussed and addressed.  A physical exam was not performed with this format.  I connected with [name] on 11/18/20 at 8:35 AM  by telephone and verified that I am speaking with the correct person using two identifiers. Brown Gersten is currently located at home and his wife is currently with him during visit. The provider, Evelina Dun, FNP is located in their office at time of visit.  I discussed the limitations, risks, security and privacy concerns of performing an evaluation and management service by telephone and the availability of in person appointments. I also discussed with the patient that there may be a patient responsible charge related to this service. The patient expressed understanding and agreed to proceed.   History and Present Illness:  Pt presents today with COVID that started yesterday. He took two at home test that were positive.  Cough This is a new problem. The current episode started yesterday. The problem has been rapidly worsening. The problem occurs every few minutes. The cough is Non-productive. Associated symptoms include chills, a fever, headaches, myalgias, nasal congestion, shortness of breath, sweats and wheezing. Pertinent negatives include no ear congestion or sore throat. The symptoms are aggravated by lying down. He has tried rest (tylenol) for the symptoms. The treatment provided mild relief. His past medical history is significant for asthma.      Review of Systems  Constitutional:  Positive for chills and fever.  HENT:  Negative for sore throat.   Respiratory:  Positive for cough,  shortness of breath and wheezing.   Musculoskeletal:  Positive for myalgias.  Neurological:  Positive for headaches.  All other systems reviewed and are negative.   Observations/Objective: No SOB, weakness noted. Pt laying in bed and wife did most of the talking.   Assessment and Plan: 1. COVID-19 virus detected COVID positive, rest, force fluids, tylenol as needed, Quarantine for at least 5 days and fever free, report any worsening symptoms such as increased shortness of breath, swelling, or continued high fevers.  - molnupiravir EUA 200 mg CAPS; Take 4 capsules (800 mg total) by mouth 2 (two) times daily for 5 days.  Dispense: 40 capsule; Refill: 0 - fluticasone (FLONASE) 50 MCG/ACT nasal spray; Place 2 sprays into both nostrils daily.  Dispense: 16 g; Refill: 6 - MyChart COVID-19 home monitoring program; Future     I discussed the assessment and treatment plan with the patient. The patient was provided an opportunity to ask questions and all were answered. The patient agreed with the plan and demonstrated an understanding of the instructions.   The patient was advised to call back or seek an in-person evaluation if the symptoms worsen or if the condition fails to improve as anticipated.  The above assessment and management plan was discussed with the patient. The patient verbalized understanding of and has agreed to the management plan. Patient is aware to call the clinic if symptoms persist or worsen. Patient is aware when to return to the clinic for a follow-up visit. Patient educated on when it is appropriate to go to the emergency department.   Time call ended:  8:46 AM   I provided 11 minutes of  non  face-to-face time during this encounter.    Evelina Dun, FNP

## 2020-11-19 ENCOUNTER — Other Ambulatory Visit: Payer: Self-pay | Admitting: Family

## 2020-11-19 MED ORDER — PROMETHAZINE-DM 6.25-15 MG/5ML PO SYRP: 5.0000 mL | ORAL_SOLUTION | Freq: Three times a day (TID) | ORAL | 0 refills | Status: DC | PRN

## 2020-11-19 MED ORDER — BENZONATATE 200 MG PO CAPS: 200.0000 mg | ORAL_CAPSULE | Freq: Three times a day (TID) | ORAL | 1 refills | Status: DC | PRN

## 2020-11-21 ENCOUNTER — Encounter: Payer: Self-pay | Admitting: Family Medicine

## 2020-11-22 NOTE — Telephone Encounter (Signed)
Visit with HAWKS 06/13 = for COVID given  Fluticasone Propionate 50 MCG/ACT 2 sprays Each Nare Daily  Molnupiravir 800 mg Oral 2 times daily   And tessalon.  Now complains of bad sore throat. Patient has used spray and cough drops and mucenix. Please advise what he can do?

## 2020-11-28 ENCOUNTER — Encounter (INDEPENDENT_AMBULATORY_CARE_PROVIDER_SITE_OTHER): Payer: Self-pay

## 2020-11-28 ENCOUNTER — Telehealth: Payer: Self-pay

## 2020-11-28 NOTE — Telephone Encounter (Signed)
Patient states that he has had to episodes of diarrhea this morning. Patient advise on symptoms per protocol as follows:    If diarrhea remains the same: encourage patient to drink oral fluids and bland foods.   Avoid alcohol, spicy foods, caffeine or fatty foods that could make diarrhea worse.   Continue to monitor for signs of dehydration (increased thirst decreased urine output, yellow urine, dry skin, headache or dizziness).   Advise patient to try OTC medication (Imodium, kaopectate, Pepto-Bismol) as per manufacturer's instructions.   If worsening diarrhea occurs and becomes severe (6-7 bowel movements a day): notify PCP   If diarrhea last greater than 7 days: notify PCP   IF SIGNS OF DEHYDRATION OCCUR (INCREASED THIRST, DECREASED URINE OUTPUT, YELLOW URINE, DRY SKIN, HEADACHE OR DIZZINESS) ADVISE PATIENT TO CALL 911 AND SEEK TREATMENT IN THE ED  Patient verbalized understanding and will continue to monitor.

## 2020-12-17 ENCOUNTER — Other Ambulatory Visit: Payer: Self-pay | Admitting: Family Medicine

## 2020-12-25 DIAGNOSIS — Z1283 Encounter for screening for malignant neoplasm of skin: Secondary | ICD-10-CM | POA: Diagnosis not present

## 2020-12-25 DIAGNOSIS — L570 Actinic keratosis: Secondary | ICD-10-CM | POA: Diagnosis not present

## 2020-12-25 DIAGNOSIS — Z85828 Personal history of other malignant neoplasm of skin: Secondary | ICD-10-CM | POA: Diagnosis not present

## 2020-12-25 DIAGNOSIS — L218 Other seborrheic dermatitis: Secondary | ICD-10-CM | POA: Diagnosis not present

## 2021-01-04 ENCOUNTER — Other Ambulatory Visit: Payer: Self-pay | Admitting: Family Medicine

## 2021-01-04 DIAGNOSIS — I10 Essential (primary) hypertension: Secondary | ICD-10-CM

## 2021-01-08 ENCOUNTER — Other Ambulatory Visit: Payer: Self-pay | Admitting: Family Medicine

## 2021-01-08 DIAGNOSIS — I10 Essential (primary) hypertension: Secondary | ICD-10-CM

## 2021-01-08 DIAGNOSIS — J4541 Moderate persistent asthma with (acute) exacerbation: Secondary | ICD-10-CM

## 2021-01-08 DIAGNOSIS — J4520 Mild intermittent asthma, uncomplicated: Secondary | ICD-10-CM

## 2021-02-02 ENCOUNTER — Other Ambulatory Visit: Payer: Self-pay | Admitting: Family Medicine

## 2021-02-02 DIAGNOSIS — J189 Pneumonia, unspecified organism: Secondary | ICD-10-CM

## 2021-02-04 DIAGNOSIS — H524 Presbyopia: Secondary | ICD-10-CM | POA: Diagnosis not present

## 2021-02-04 DIAGNOSIS — H40013 Open angle with borderline findings, low risk, bilateral: Secondary | ICD-10-CM | POA: Diagnosis not present

## 2021-02-08 ENCOUNTER — Other Ambulatory Visit: Payer: Self-pay | Admitting: Family Medicine

## 2021-02-08 DIAGNOSIS — E538 Deficiency of other specified B group vitamins: Secondary | ICD-10-CM

## 2021-02-11 ENCOUNTER — Telehealth: Payer: Self-pay | Admitting: Family Medicine

## 2021-02-11 ENCOUNTER — Other Ambulatory Visit: Payer: Self-pay | Admitting: Family Medicine

## 2021-02-11 MED ORDER — "SYRINGE/NEEDLE (DISP) 23G X 1"" 1 ML MISC": 1.0000 | 1 refills | Status: DC

## 2021-02-11 NOTE — Telephone Encounter (Signed)
Pt's wife said that they need a precription sent to [facility] in [place] for pt to get the needles to go with his B12 vials. The pharmacy told them they are unable to give any needles without having a prescription for them. Please call back.

## 2021-02-11 NOTE — Telephone Encounter (Signed)
Needles and syringe sent to pharmacy

## 2021-02-11 NOTE — Telephone Encounter (Signed)
Pt's wife said that they need a precription sent to [facility] in

## 2021-02-24 ENCOUNTER — Ambulatory Visit (INDEPENDENT_AMBULATORY_CARE_PROVIDER_SITE_OTHER): Payer: Medicare HMO

## 2021-02-24 VITALS — Ht 67.0 in | Wt 180.0 lb

## 2021-02-24 DIAGNOSIS — Z0000 Encounter for general adult medical examination without abnormal findings: Secondary | ICD-10-CM | POA: Diagnosis not present

## 2021-02-24 NOTE — Progress Notes (Signed)
Subjective:   Jimmy Park is a 70 y.o. male who presents for Medicare Annual/Subsequent preventive examination.  Virtual Visit via Telephone Note  I connected with  Jimmy Park on 02/24/21 at 10:30 AM EDT by telephone and verified that I am speaking with the correct person using two identifiers.  Location: Patient: Home Provider: [facility] Persons participating in the virtual visit: patient/Nurse Health Advisor   I discussed the limitations, risks, security and privacy concerns of performing an evaluation and management service by telephone and the availability of in person appointments. The patient expressed understanding and agreed to proceed.  Interactive audio and video telecommunications were attempted between this nurse and patient, however failed, due to patient having technical difficulties OR patient did not have access to video capability.  We continued and completed visit with audio only.  Some vital signs may be absent or patient reported.   Charlann Wayne E Victoriah Wilds, LPN   Review of Systems     Cardiac Risk Factors include: advanced age (>34men, >82 women);dyslipidemia;hypertension;male gender;sedentary lifestyle     Objective:    Today's Vitals   02/24/21 1011  Weight: 180 lb (81.6 kg)  Height: 5\' 7"  (1.702 m)   Body mass index is 28.19 kg/m.  Advanced Directives 02/24/2021 02/23/2020 02/09/2019 01/09/2015  Does Patient Have a Medical Advance Directive? Yes Yes Yes Yes  Type of Paramedic of [place];Living will [place];Living will [place];Living will -  Does patient want to make changes to medical advance directive? - No - Patient declined No - Patient declined -  Copy of [place] in Chart? No - copy requested - No - copy requested -    Current Medications (verified) Outpatient Encounter Medications as of 02/24/2021  Medication Sig   albuterol (VENTOLIN HFA) 108 (90 Base) MCG/ACT inhaler  TAKE 2 PUFFS BY MOUTH EVERY 6 HOURS AS NEEDED FOR WHEEZE OR SHORTNESS OF BREATH   aspirin 81 MG tablet Take 1 tablet by mouth daily.   benzonatate (TESSALON) 200 MG capsule Take 1 capsule (200 mg total) by mouth 3 (three) times daily as needed.   budesonide (PULMICORT) 0.5 MG/2ML nebulizer solution TAKE 2 MLS (0.5 MG TOTAL) BY NEBULIZATION 2 (TWO) TIMES DAILY AS NEEDED.   cetirizine (ZYRTEC) 10 MG tablet TAKE 1 TABLET BY MOUTH EVERY DAY   cyanocobalamin (,VITAMIN B-12,) 1000 MCG/ML injection INJECT 1 ML (1,000 MCG TOTAL) INTO THE SKIN EVERY 30 (THIRTY) DAYS.   DENTAGEL 1.1 % GEL dental gel SMARTSIG:Application To Teeth   fexofenadine (ALLEGRA) 180 MG tablet TAKE 1 TABLET BY MOUTH EVERY DAY   fluticasone (FLONASE) 50 MCG/ACT nasal spray Place 2 sprays into both nostrils daily.   Fluticasone-Salmeterol (ADVAIR DISKUS) 250-50 MCG/DOSE AEPB Inhale 1 puff into the lungs 2 (two) times daily.   folic acid (FOLVITE) 1 MG tablet Take 1 tablet by mouth daily.   ibuprofen (ADVIL,MOTRIN) 400 MG tablet Take 1 tablet by mouth 2 (two) times daily as needed.   ketoconazole (NIZORAL) 2 % shampoo Apply topically.   metoprolol tartrate (LOPRESSOR) 25 MG tablet TAKE 1 TABLET BY MOUTH EVERY DAY   montelukast (SINGULAIR) 10 MG tablet TAKE 1 TABLET BY MOUTH EVERY DAY   Omega-3 Fatty Acids (FISH OIL) 1000 MG CAPS Take by mouth.   omeprazole (PRILOSEC) 40 MG capsule TAKE 1 CAPSULE BY MOUTH EVERY DAY   promethazine-dextromethorphan (PROMETHAZINE-DM) 6.25-15 MG/5ML syrup Take 5 mLs by mouth 3 (three) times daily as needed for cough.  SYRINGE/NEEDLE, DISP, 1 ML 23G X 1" 1 ML MISC 1 Syringe by Does not apply route every 30 (thirty) days.   valsartan (DIOVAN) 320 MG tablet TAKE 1 TABLET BY MOUTH EVERY DAY   No facility-administered encounter medications on file as of 02/24/2021.    Allergies (verified) Patient has no known allergies.   History: Past Medical History:  Diagnosis Date   Allergy    Anemia    Asthma     B12 deficiency    Cerebral ventriculomegaly    Gait abnormality    Hypertension    Past Surgical History:  Procedure Laterality Date   LUMBAR DRAIN IMPLANTATION     Family History  Problem Relation Age of Onset   Diabetes Mother    Hypertension Mother    Diabetes Father    Hypertension Father    Asthma Brother    Social History   Socioeconomic History   Marital status: Married    Spouse name: [name]   Number of children: 4   Years of education: 12   Highest education level: High school graduate  Occupational History   Occupation: retired  Tobacco Use   Smoking status: Former    Packs/day: 0.50    Types: Cigarettes    Start date: 06/09/1967    Quit date: 06/08/1973    Years since quitting: 47.7   Smokeless tobacco: Never  Vaping Use   Vaping Use: Never used  Substance and Sexual Activity   Alcohol use: Not Currently    Alcohol/week: 0.0 standard drinks   Drug use: No   Sexual activity: Not Currently  Other Topics Concern   Not on file  Social History Narrative   Lives home with wife and her son   Social Determinants of Health   Financial Resource Strain: Low Risk    Difficulty of Paying Living Expenses: Not hard at all  Food Insecurity: No Food Insecurity   Worried About Charity fundraiser in the Last Year: Never true   Arboriculturist in the Last Year: Never true  Transportation Needs: No Transportation Needs   Lack of Transportation (Medical): No   Lack of Transportation (Non-Medical): No  Physical Activity: Insufficiently Active   Days of Exercise per Week: 3 days   Minutes of Exercise per Session: 20 min  Stress: No Stress Concern Present   Feeling of Stress : Only a little  Social Connections: Moderately Integrated   Frequency of Communication with Friends and Family: Twice a week   Frequency of Social Gatherings with Friends and Family: Twice a week   Attends Religious Services: 1 to 4 times per year   Active Member of [employer] or Organizations: No    Attends Music therapist: Never   Marital Status: Married    Tobacco Counseling Counseling given: Not Answered   Clinical Intake:  Pre-visit preparation completed: Yes  Pain : No/denies pain     BMI - recorded: 28.19 Nutritional Status: BMI 25 -29 Overweight Nutritional Risks: None Diabetes: No  How often do you need to have someone help you when you read instructions, pamphlets, or other written materials from your doctor or pharmacy?: 1 - Never  Diabetic? no  Interpreter Needed?: No  Information entered by :: Wilkie Zenon, LPN   Activities of Daily Living In your present state of health, do you have any difficulty performing the following activities: 02/24/2021  Hearing? N  Vision? N  Difficulty concentrating or making decisions? N  Walking or climbing stairs?  N  Dressing or bathing? N  Doing errands, shopping? Y  Comment cannot drive - wife Restaurant manager, fast food and eating ? N  Using the Toilet? N  In the past six months, have you accidently leaked urine? N  Do you have problems with loss of bowel control? N  Managing your Medications? N  Managing your Finances? N  Housekeeping or managing your Housekeeping? N  Some recent data might be hidden    Patient Care Team: Claretta Fraise, MD as PCP - General (Family Medicine)  Indicate any recent Medical Services you may have received from other than [facility] providers in the past year (date may be approximate).     Assessment:   This is a routine wellness examination for Jimmy Park.  Hearing/Vision screen Hearing Screening - Comments:: Denies hearing difficulties  Vision Screening - Comments:: Wears eyeglasses - up to date with annual eye exams at [place] issues and exercise activities discussed: Current Exercise Habits: Home exercise routine, Type of exercise: walking, Time (Minutes): 20, Frequency (Times/Week): 3, Weekly Exercise (Minutes/Week): 60, Intensity: Mild, Exercise  limited by: respiratory conditions(s)   Goals Addressed             This Visit's Progress    DIET - INCREASE WATER INTAKE   On track    Try to drink 6-8 glasses of water daily.     Prevent falls   Not on track      Depression Screen PHQ 2/9 Scores 02/24/2021 10/08/2020 04/10/2020 02/23/2020 02/09/2019 08/09/2018 02/08/2018  PHQ - 2 Score 0 0 0 0 0 0 0    Fall Risk Fall Risk  02/24/2021 10/08/2020 10/08/2020 04/10/2020 02/23/2020  Falls in the past year? 1 1 0 0 0  Number falls in past yr: 1 0 - - -  Injury with Fall? 0 0 - - -  Comment - - - - -  Risk for fall due to : Impaired balance/gait;History of fall(s) History of fall(s) - - -  Follow up Education provided;Falls prevention discussed Falls evaluation completed - Falls evaluation completed -  Comment - - - - -    FALL RISK PREVENTION PERTAINING TO THE HOME:  Any stairs in or around the home? Yes  If so, are there any without handrails? No  Home free of loose throw rugs in walkways, pet beds, electrical cords, etc? Yes  Adequate lighting in your home to reduce risk of falls? Yes   ASSISTIVE DEVICES UTILIZED TO PREVENT FALLS:  Life alert? No  Use of a cane, walker or w/c? Yes  Grab bars in the bathroom? Yes  Shower chair or bench in shower? No  Elevated toilet seat or a handicapped toilet? Yes   TIMED UP AND GO:  Was the test performed? No . Telephonic visit.  Cognitive Function: Normal cognitive status assessed by direct observation by this Nurse Health Advisor. No abnormalities found.       6CIT Screen 02/23/2020 02/23/2020 02/09/2019  What Year? - 0 points 0 points  What month? - 0 points 0 points  What time? - 0 points 0 points  Count back from 20 - 0 points 0 points  Months in reverse - 0 points 0 points  Repeat phrase 2 points 0 points 4 points  Total Score - 0 4    Immunizations Immunization History  Administered Date(s) Administered   DTaP 09/18/2007   Fluad Quad(high Dose 65+) 03/08/2019, 03/22/2020    Influenza Whole 05/14/2010, 07/01/2011, 02/15/2012,  03/08/2013, 02/26/2014   Influenza, High Dose Seasonal PF 03/17/2018   Influenza,inj,Quad PF,6+ Mos 03/27/2015, 02/26/2016   Influenza-Unspecified 03/17/2017   Moderna SARS-COV2 Booster Vaccination 01/08/2021   Moderna Sars-Covid-2 Vaccination 08/02/2019, 08/30/2019, 04/24/2020   Pneumococcal Conjugate-13 08/06/2017   Pneumococcal Polysaccharide-23 08/09/2018   Pneumococcal-Unspecified 02/15/2012   Td 08/09/2018   Tdap 09/18/2007    TDAP status: Up to date  Flu Vaccine status: Due, Education has been provided regarding the importance of this vaccine. Advised may receive this vaccine at local pharmacy or Health Dept. Aware to provide a copy of the vaccination record if obtained from local pharmacy or Health Dept. Verbalized acceptance and understanding.  Pneumococcal vaccine status: Up to date  Covid-19 vaccine status: Completed vaccines  Qualifies for Shingles Vaccine? Yes   Zostavax completed Yes   Shingrix Completed?: No.    Education has been provided regarding the importance of this vaccine. Patient has been advised to call insurance company to determine out of pocket expense if they have not yet received this vaccine. Advised may also receive vaccine at local pharmacy or Health Dept. Verbalized acceptance and understanding.  Screening Tests Health Maintenance  Topic Date Due   Zoster Vaccines- Shingrix (1 of 2) Never done   INFLUENZA VACCINE  01/06/2021   COLONOSCOPY (Pts 45-37yrs Insurance coverage will need to be confirmed)  05/08/2024   TETANUS/TDAP  08/08/2028   COVID-19 Vaccine  Completed   Hepatitis C Screening  Completed   HPV VACCINES  Aged Out    Health Maintenance  Health Maintenance Due  Topic Date Due   Zoster Vaccines- Shingrix (1 of 2) Never done   INFLUENZA VACCINE  01/06/2021    Colorectal cancer screening: Type of screening: Colonoscopy. Completed 05/08/2014. Repeat every 10 years  Lung Cancer  Screening: (Low Dose CT Chest recommended if Age 69-80 years, 30 pack-year currently smoking OR have quit w/in 15years.) does not qualify  Additional Screening:  Hepatitis C Screening: does qualify; Completed 04/10/2020  Vision Screening: Recommended annual ophthalmology exams for early detection of glaucoma and other disorders of the eye. Is the patient up to date with their annual eye exam?  Yes  Who is the provider or what is the name of the office in which the patient attends annual eye exams? Leticia Clas at [facility] in [place] If pt is not established with a provider, would they like to be referred to a provider to establish care? No .   Dental Screening: Recommended annual dental exams for proper oral hygiene  Community Resource Referral / Chronic Care Management: CRR required this visit?  No   CCM required this visit?  No      Plan:     I have personally reviewed and noted the following in the patient's chart:   Medical and social history Use of alcohol, tobacco or illicit drugs  Current medications and supplements including opioid prescriptions. Patient is not currently taking opioid prescriptions. Functional ability and status Nutritional status Physical activity Advanced directives List of other physicians Hospitalizations, surgeries, and ER visits in previous 12 months Vitals Screenings to include cognitive, depression, and falls Referrals and appointments  In addition, I have reviewed and discussed with patient certain preventive protocols, quality metrics, and best practice recommendations. A written personalized care plan for preventive services as well as general preventive health recommendations were provided to patient.     Sandrea Hammond, LPN   [id number]   Nurse Notes: None

## 2021-02-24 NOTE — Patient Instructions (Signed)
Jimmy Park , Thank you for taking time to come for your Medicare Wellness Visit. I appreciate your ongoing commitment to your health goals. Please review the following plan we discussed and let me know if I can assist you in the future.   Screening recommendations/referrals: Colonoscopy: Done 05/08/2014 - Repeat in 10 years  Recommended yearly ophthalmology/optometry visit for glaucoma screening and checkup Recommended yearly dental visit for hygiene and checkup  Vaccinations: Influenza vaccine: Done 03/22/2020 - Repeat annually  Pneumococcal vaccine: Done 08/06/2017 & 08/09/2018 Tdap vaccine: Done 08/09/2018 - Repeat in 10 years  Shingles vaccine: Due. Shingrix discussed. Please contact your pharmacy or insurance for coverage information.     Covid-19: Done 08/02/19, 08/30/19, 04/24/20, 01/08/2021  Advanced directives: Please bring a copy of your health care power of attorney and living will to the office to be added to your chart at your convenience.   Conditions/risks identified: Aim for 30 minutes of exercise or brisk walking each day, drink 6-8 glasses of water and eat lots of fruits and vegetables.   Next appointment: Follow up in one year for your annual wellness visit.   Preventive Care 13 Years and Older, Male  Preventive care refers to lifestyle choices and visits with your health care provider that can promote health and wellness. What does preventive care include? A yearly physical exam. This is also called an annual well check. Dental exams once or twice a year. Routine eye exams. Ask your health care provider how often you should have your eyes checked. Personal lifestyle choices, including: Daily care of your teeth and gums. Regular physical activity. Eating a healthy diet. Avoiding tobacco and drug use. Limiting alcohol use. Practicing safe sex. Taking low doses of aspirin every day. Taking vitamin and mineral supplements as recommended by your health care provider. What  happens during an annual well check? The services and screenings done by your health care provider during your annual well check will depend on your age, overall health, lifestyle risk factors, and family history of disease. Counseling  Your health care provider may ask you questions about your: Alcohol use. Tobacco use. Drug use. Emotional well-being. Home and relationship well-being. Sexual activity. Eating habits. History of falls. Memory and ability to understand (cognition). Work and work Statistician. Screening  You may have the following tests or measurements: Height, weight, and BMI. Blood pressure. Lipid and cholesterol levels. These may be checked every 5 years, or more frequently if you are over 10 years old. Skin check. Lung cancer screening. You may have this screening every year starting at age 46 if you have a 30-pack-year history of smoking and currently smoke or have quit within the past 15 years. Fecal occult blood test (FOBT) of the stool. You may have this test every year starting at age 14. Flexible sigmoidoscopy or colonoscopy. You may have a sigmoidoscopy every 5 years or a colonoscopy every 10 years starting at age 44. Prostate cancer screening. Recommendations will vary depending on your family history and other risks. Hepatitis C blood test. Hepatitis B blood test. Sexually transmitted disease (STD) testing. Diabetes screening. This is done by checking your blood sugar (glucose) after you have not eaten for a while (fasting). You may have this done every 1-3 years. Abdominal aortic aneurysm (AAA) screening. You may need this if you are a current or former smoker. Osteoporosis. You may be screened starting at age 23 if you are at high risk. Talk with your health care provider about your test results, treatment  options, and if necessary, the need for more tests. Vaccines  Your health care provider may recommend certain vaccines, such as: Influenza vaccine. This  is recommended every year. Tetanus, diphtheria, and acellular pertussis (Tdap, Td) vaccine. You may need a Td booster every 10 years. Zoster vaccine. You may need this after age 48. Pneumococcal 13-valent conjugate (PCV13) vaccine. One dose is recommended after age 3. Pneumococcal polysaccharide (PPSV23) vaccine. One dose is recommended after age 39. Talk to your health care provider about which screenings and vaccines you need and how often you need them. This information is not intended to replace advice given to you by your health care provider. Make sure you discuss any questions you have with your health care provider. Document Released: 06/21/2015 Document Revised: 02/12/2016 Document Reviewed: 03/26/2015 Elsevier Interactive Patient Education  2017 [place] Prevention in the Home Falls can cause injuries. They can happen to people of all ages. There are many things you can do to make your home safe and to help prevent falls. What can I do on the outside of my home? Regularly fix the edges of walkways and driveways and fix any cracks. Remove anything that might make you trip as you walk through a door, such as a raised step or threshold. Trim any bushes or trees on the path to your home. Use bright outdoor lighting. Clear any walking paths of anything that might make someone trip, such as rocks or tools. Regularly check to see if handrails are loose or broken. Make sure that both sides of any steps have handrails. Any raised decks and porches should have guardrails on the edges. Have any leaves, snow, or ice cleared regularly. Use sand or salt on walking paths during winter. Clean up any spills in your garage right away. This includes oil or grease spills. What can I do in the bathroom? Use night lights. Install grab bars by the toilet and in the tub and shower. Do not use towel bars as grab bars. Use non-skid mats or decals in the tub or shower. If you need to sit down  in the shower, use a plastic, non-slip stool. Keep the floor dry. Clean up any water that spills on the floor as soon as it happens. Remove soap buildup in the tub or shower regularly. Attach bath mats securely with double-sided non-slip rug tape. Do not have throw rugs and other things on the floor that can make you trip. What can I do in the bedroom? Use night lights. Make sure that you have a light by your bed that is easy to reach. Do not use any sheets or blankets that are too big for your bed. They should not hang down onto the floor. Have a firm chair that has side arms. You can use this for support while you get dressed. Do not have throw rugs and other things on the floor that can make you trip. What can I do in the kitchen? Clean up any spills right away. Avoid walking on wet floors. Keep items that you use a lot in easy-to-reach places. If you need to reach something above you, use a strong step stool that has a grab bar. Keep electrical cords out of the way. Do not use floor polish or wax that makes floors slippery. If you must use wax, use non-skid floor wax. Do not have throw rugs and other things on the floor that can make you trip. What can I do with my stairs? Do not  leave any items on the stairs. Make sure that there are handrails on both sides of the stairs and use them. Fix handrails that are broken or loose. Make sure that handrails are as long as the stairways. Check any carpeting to make sure that it is firmly attached to the stairs. Fix any carpet that is loose or worn. Avoid having throw rugs at the top or bottom of the stairs. If you do have throw rugs, attach them to the floor with carpet tape. Make sure that you have a light switch at the top of the stairs and the bottom of the stairs. If you do not have them, ask someone to add them for you. What else can I do to help prevent falls? Wear shoes that: Do not have high heels. Have rubber bottoms. Are comfortable  and fit you well. Are closed at the toe. Do not wear sandals. If you use a stepladder: Make sure that it is fully opened. Do not climb a closed stepladder. Make sure that both sides of the stepladder are locked into place. Ask someone to hold it for you, if possible. Clearly mark and make sure that you can see: Any grab bars or handrails. First and last steps. Where the edge of each step is. Use tools that help you move around (mobility aids) if they are needed. These include: Canes. Walkers. Scooters. Crutches. Turn on the lights when you go into a dark area. Replace any light bulbs as soon as they burn out. Set up your furniture so you have a clear path. Avoid moving your furniture around. If any of your floors are uneven, fix them. If there are any pets around you, be aware of where they are. Review your medicines with your doctor. Some medicines can make you feel dizzy. This can increase your chance of falling. Ask your doctor what other things that you can do to help prevent falls. This information is not intended to replace advice given to you by your health care provider. Make sure you discuss any questions you have with your health care provider. Document Released: 03/21/2009 Document Revised: 10/31/2015 Document Reviewed: 06/29/2014 Elsevier Interactive Patient Education  2017 [employer].

## 2021-03-10 ENCOUNTER — Ambulatory Visit (INDEPENDENT_AMBULATORY_CARE_PROVIDER_SITE_OTHER): Payer: Medicare HMO

## 2021-03-10 ENCOUNTER — Other Ambulatory Visit: Payer: Self-pay

## 2021-03-10 DIAGNOSIS — Z23 Encounter for immunization: Secondary | ICD-10-CM

## 2021-03-24 ENCOUNTER — Encounter: Payer: Self-pay | Admitting: Family Medicine

## 2021-03-24 ENCOUNTER — Ambulatory Visit (INDEPENDENT_AMBULATORY_CARE_PROVIDER_SITE_OTHER): Payer: Medicare HMO | Admitting: Family Medicine

## 2021-03-24 DIAGNOSIS — J069 Acute upper respiratory infection, unspecified: Secondary | ICD-10-CM

## 2021-03-24 MED ORDER — PSEUDOEPH-BROMPHEN-DM 30-2-10 MG/5ML PO SYRP
5.0000 mL | ORAL_SOLUTION | Freq: Four times a day (QID) | ORAL | 0 refills | Status: DC | PRN
Start: 2021-03-24 — End: 2021-10-29

## 2021-03-24 NOTE — Progress Notes (Signed)
   Virtual Visit  Note Due to COVID-19 pandemic this visit was conducted virtually. This visit type was conducted due to national recommendations for restrictions regarding the COVID-19 Pandemic (e.g. social distancing, sheltering in place) in an effort to limit this patient's exposure and mitigate transmission in our community. All issues noted in this document were discussed and addressed.  A physical exam was not performed with this format.  I connected with Jimmy Park on 03/24/21 at 1318 by telephone and verified that I am speaking with the correct person using two identifiers. Jimmy Park is currently located at home and his wife is currently with him during the visit. The provider, Gwenlyn Perking, FNP is located in their office at time of visit.  I discussed the limitations, risks, security and privacy concerns of performing an evaluation and management service by telephone and the availability of in person appointments. I also discussed with the patient that there may be a patient responsible charge related to this service. The patient expressed understanding and agreed to proceed.  CC: cough  History and Present Illness:  HPI History was provided by [facility] and his wife. Jimmy Park reports a dry cough, HA, and left ear pain for 3 days. He denies sore throat, congestion, nausea, vomiting, diarrhea, chest pain, or shortness of breath. His temperature has been around 99 for the last few days. He has tried OTC cough syrup. Denies exposure to Covid. He has had a negative home test.      ROS As per HPI.   Observations/Objective: Alert and oriented x 3. Able to speak in full sentences without difficulty.   Assessment and Plan: Jimmy Park was seen today for cough.  Diagnoses and all orders for this visit:  Viral upper respiratory tract infection Declined Covid and flu testing today. Cough syrup as below. Discussed symptomatic care and return precautions.  -      brompheniramine-pseudoephedrine-DM 30-2-10 MG/5ML syrup; Take 5 mLs by mouth 4 (four) times daily as needed.    Follow Up Instructions: As needed.     I discussed the assessment and treatment plan with the patient. The patient was provided an opportunity to ask questions and all were answered. The patient agreed with the plan and demonstrated an understanding of the instructions.   The patient was advised to call back or seek an in-person evaluation if the symptoms worsen or if the condition fails to improve as anticipated.  The above assessment and management plan was discussed with the patient. The patient verbalized understanding of and has agreed to the management plan. Patient is aware to call the clinic if symptoms persist or worsen. Patient is aware when to return to the clinic for a follow-up visit. Patient educated on when it is appropriate to go to the emergency department.   Time call ended:  1329  I provided 11 minutes of  non face-to-face time during this encounter.    Gwenlyn Perking, FNP

## 2021-04-06 ENCOUNTER — Other Ambulatory Visit: Payer: Self-pay | Admitting: Family Medicine

## 2021-04-06 DIAGNOSIS — I10 Essential (primary) hypertension: Secondary | ICD-10-CM

## 2021-04-07 ENCOUNTER — Other Ambulatory Visit: Payer: Self-pay | Admitting: Family Medicine

## 2021-04-07 DIAGNOSIS — E538 Deficiency of other specified B group vitamins: Secondary | ICD-10-CM

## 2021-04-07 DIAGNOSIS — I10 Essential (primary) hypertension: Secondary | ICD-10-CM

## 2021-04-10 ENCOUNTER — Ambulatory Visit (INDEPENDENT_AMBULATORY_CARE_PROVIDER_SITE_OTHER): Payer: Medicare HMO | Admitting: Family Medicine

## 2021-04-10 ENCOUNTER — Encounter: Payer: Self-pay | Admitting: Family Medicine

## 2021-04-10 ENCOUNTER — Ambulatory Visit (INDEPENDENT_AMBULATORY_CARE_PROVIDER_SITE_OTHER): Payer: Medicare HMO

## 2021-04-10 ENCOUNTER — Other Ambulatory Visit: Payer: Self-pay | Admitting: Family Medicine

## 2021-04-10 ENCOUNTER — Other Ambulatory Visit: Payer: Self-pay | Admitting: Family

## 2021-04-10 ENCOUNTER — Other Ambulatory Visit: Payer: Self-pay

## 2021-04-10 VITALS — BP 136/75 | HR 61 | Temp 97.5°F | Ht 67.0 in | Wt 183.4 lb

## 2021-04-10 DIAGNOSIS — J069 Acute upper respiratory infection, unspecified: Secondary | ICD-10-CM

## 2021-04-10 DIAGNOSIS — G912 (Idiopathic) normal pressure hydrocephalus: Secondary | ICD-10-CM

## 2021-04-10 DIAGNOSIS — J0191 Acute recurrent sinusitis, unspecified: Secondary | ICD-10-CM | POA: Diagnosis not present

## 2021-04-10 DIAGNOSIS — I10 Essential (primary) hypertension: Secondary | ICD-10-CM

## 2021-04-10 DIAGNOSIS — E538 Deficiency of other specified B group vitamins: Secondary | ICD-10-CM

## 2021-04-10 DIAGNOSIS — R079 Chest pain, unspecified: Secondary | ICD-10-CM

## 2021-04-10 DIAGNOSIS — Z23 Encounter for immunization: Secondary | ICD-10-CM

## 2021-04-10 DIAGNOSIS — J4520 Mild intermittent asthma, uncomplicated: Secondary | ICD-10-CM

## 2021-04-10 DIAGNOSIS — E785 Hyperlipidemia, unspecified: Secondary | ICD-10-CM

## 2021-04-10 DIAGNOSIS — J449 Chronic obstructive pulmonary disease, unspecified: Secondary | ICD-10-CM | POA: Diagnosis not present

## 2021-04-10 DIAGNOSIS — R0600 Dyspnea, unspecified: Secondary | ICD-10-CM | POA: Diagnosis not present

## 2021-04-10 DIAGNOSIS — U071 COVID-19: Secondary | ICD-10-CM

## 2021-04-10 DIAGNOSIS — R059 Cough, unspecified: Secondary | ICD-10-CM | POA: Diagnosis not present

## 2021-04-10 MED ORDER — BETAMETHASONE SOD PHOS & ACET 6 (3-3) MG/ML IJ SUSP
6.0000 mg | Freq: Once | INTRAMUSCULAR | Status: AC
  Administered 2021-04-10: 6 mg via INTRAMUSCULAR

## 2021-04-10 MED ORDER — OMEPRAZOLE 40 MG PO CPDR: DELAYED_RELEASE_CAPSULE | ORAL | 2 refills | Status: DC

## 2021-04-10 MED ORDER — METOPROLOL TARTRATE 25 MG PO TABS: 25.0000 mg | ORAL_TABLET | Freq: Every day | ORAL | 3 refills | Status: DC

## 2021-04-10 MED ORDER — VALSARTAN 320 MG PO TABS: 320.0000 mg | ORAL_TABLET | Freq: Every day | ORAL | 2 refills | Status: DC

## 2021-04-10 MED ORDER — CYANOCOBALAMIN 1000 MCG/ML IJ SOLN: 1000.0000 ug | INTRAMUSCULAR | 2 refills | Status: DC

## 2021-04-10 NOTE — Progress Notes (Signed)
Subjective:  Patient ID: Jimmy Park, male    DOB: [birth year]  Age: 70 y.o. MRN: [id number]  CC: Medical Management of Chronic Issues   HPI Jimmy Park presents for  presents for  follow-up of hypertension. Patient has no history of headache chest pain or shortness of breath or recent cough. Patient also denies symptoms of TIA such as focal numbness or weakness. Patient denies side effects from medication. States taking it regularly.  Also having some unstable gait. 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 Has not followed with neuro for several years. HAd procedure at [facility], Dr. 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   Also having left ear pain since recent URI. Also congested. Some residual pain at the left costal margin since his pneumonia several months ago.   Depression screen [facility] 2/9 04/10/2021 02/24/2021 10/08/2020  Decreased Interest 0 0 0  Down, Depressed, Hopeless 0 0 0  PHQ - 2 Score 0 0 0  Altered sleeping 0 - -  Tired, decreased energy 1 - -  Change in appetite 0 - -  Feeling bad or failure about yourself  0 - -  Trouble concentrating 0 - -  Moving slowly or fidgety/restless 0 - -  Suicidal thoughts 0 - -  PHQ-9 Score 1 - -  Difficult doing work/chores Not difficult at all - -    History Jimmy Park has a past medical history of Allergy, Anemia, Asthma, B12 deficiency, Cerebral ventriculomegaly, Gait abnormality, Hypertension, Pneumonia of left lung due to infectious organism (08/21/2020), and URI with cough and congestion (08/21/2020).   He has a past surgical history that includes Lumbar drain implantation.   His family history includes Asthma in his brother; Diabetes in his father and mother; Hypertension in his father and mother.He reports that he quit smoking about 47 years ago. His smoking use included cigarettes. He started smoking about 53 years ago. He smoked an average of .5 packs per day. He has never used  smokeless tobacco. He reports that he does not currently use alcohol. He reports that he does not use drugs.    ROS Review of Systems  Constitutional:  Negative for activity change, appetite change, chills and fever.  HENT:  Positive for congestion, ear pain, postnasal drip and sinus pressure. Negative for ear discharge, hearing loss, nosebleeds, rhinorrhea, sneezing and trouble swallowing.   Respiratory:  Negative for chest tightness and shortness of breath.   Cardiovascular:  Positive for chest pain (left anterior costal margin.). Negative for palpitations.  Skin:  Negative for rash.   Objective:  BP 136/75   Pulse 61   Temp (!) 97.5 F (36.4 C)   Ht $R'5\' 7"'Qx$  (1.702 m)   Wt 183 lb 6.4 oz (83.2 kg)   SpO2 96%   BMI 28.72 kg/m   BP Readings from Last 3 Encounters:  04/10/21 136/75  10/08/20 123/73  08/21/20 139/87    Wt Readings from Last 3 Encounters:  04/10/21 183 lb 6.4 oz (83.2 kg)  02/24/21 180 lb (81.6 kg)  10/08/20 179 lb 6.4 oz (81.4 kg)     Physical Exam Vitals reviewed.  Constitutional:      Appearance: He is well-developed.  HENT:     Head: Normocephalic and atraumatic.     Right Ear: External ear normal.     Left Ear: Tympanic membrane, ear canal and external ear normal. There is no impacted cerumen.     Nose: Congestion present. No  rhinorrhea.     Mouth/Throat:     Pharynx: No oropharyngeal exudate or posterior oropharyngeal erythema.  Eyes:     Pupils: Pupils are equal, round, and reactive to light.  Cardiovascular:     Rate and Rhythm: Normal rate and regular rhythm.     Heart sounds: No murmur heard. Pulmonary:     Effort: No respiratory distress.     Breath sounds: Normal breath sounds.  Musculoskeletal:     Cervical back: Normal range of motion and neck supple.  Neurological:     Mental Status: He is alert and oriented to person, place, and time.     Gait: Gait abnormal (weak, unsteady).  Psychiatric:        Mood and Affect: Mood normal.       Assessment & Plan:   Jimmy Park was seen today for medical management of chronic issues.  Diagnoses and all orders for this visit:  Hyperlipidemia, unspecified hyperlipidemia type -     Lipid panel  Benign essential HTN -     CBC with Differential/Platelet -     CMP14+EGFR -     metoprolol tartrate (LOPRESSOR) 25 MG tablet; Take 1 tablet (25 mg total) by mouth daily. -     valsartan (DIOVAN) 320 MG tablet; Take 1 tablet (320 mg total) by mouth daily.  Vitamin B12 deficiency -     cyanocobalamin (,VITAMIN B-12,) 1000 MCG/ML injection; Inject 1 mL (1,000 mcg total) into the skin every 30 (thirty) days.  Normal pressure hydrocephalus (HCC) -     Ambulatory referral to Neurosurgery  Intermittent asthma, unspecified asthma severity, unspecified whether complicated -     betamethasone acetate-betamethasone sodium phosphate (CELESTONE) injection 6 mg  Acute recurrent sinusitis, unspecified location -     betamethasone acetate-betamethasone sodium phosphate (CELESTONE) injection 6 mg  Left-sided chest pain -     DG Chest 2 View; Future  Other orders -     omeprazole (PRILOSEC) 40 MG capsule; TAKE 1 CAPSULE BY MOUTH EVERY DAY      I have discontinued Frantz Cancro's fexofenadine. I have also changed his metoprolol tartrate and valsartan. Additionally, I am having him maintain his aspirin, folic acid, ibuprofen, Fish Oil, Fluticasone-Salmeterol, montelukast, fluticasone, benzonatate, albuterol, budesonide, cetirizine, SYRINGE/NEEDLE (DISP) 1 ML, ketoconazole, DentaGel, brompheniramine-pseudoephedrine-DM, cyanocobalamin, and omeprazole. We will continue to administer betamethasone acetate-betamethasone sodium phosphate.  Allergies as of 04/10/2021   No Known Allergies      Medication List        Accurate as of April 10, 2021  9:55 AM. If you have any questions, ask your nurse or doctor.          STOP taking these medications    fexofenadine 180 MG tablet Commonly  known as: ALLEGRA Stopped by: Claretta Fraise, MD       TAKE these medications    albuterol 108 (90 Base) MCG/ACT inhaler Commonly known as: VENTOLIN HFA TAKE 2 PUFFS BY MOUTH EVERY 6 HOURS AS NEEDED FOR WHEEZE OR SHORTNESS OF BREATH   aspirin 81 MG tablet Take 1 tablet by mouth daily.   benzonatate 200 MG capsule Commonly known as: TESSALON Take 1 capsule (200 mg total) by mouth 3 (three) times daily as needed.   brompheniramine-pseudoephedrine-DM 30-2-10 MG/5ML syrup Take 5 mLs by mouth 4 (four) times daily as needed.   budesonide 0.5 MG/2ML nebulizer solution Commonly known as: PULMICORT TAKE 2 MLS (0.5 MG TOTAL) BY NEBULIZATION 2 (TWO) TIMES DAILY AS NEEDED.   cetirizine 10 MG tablet  Commonly known as: ZYRTEC TAKE 1 TABLET BY MOUTH EVERY DAY   cyanocobalamin 1000 MCG/ML injection Commonly known as: (VITAMIN B-12) Inject 1 mL (1,000 mcg total) into the skin every 30 (thirty) days.   DentaGel 1.1 % Gel dental gel Generic drug: sodium fluoride SMARTSIG:Application To Teeth   Fish Oil 1000 MG Caps Take by mouth.   fluticasone 50 MCG/ACT nasal spray Commonly known as: FLONASE Place 2 sprays into both nostrils daily.   Fluticasone-Salmeterol 250-50 MCG/DOSE Aepb Commonly known as: Advair Diskus Inhale 1 puff into the lungs 2 (two) times daily.   folic acid 1 MG tablet Commonly known as: FOLVITE Take 1 tablet by mouth daily.   ibuprofen 400 MG tablet Commonly known as: ADVIL Take 1 tablet by mouth 2 (two) times daily as needed.   ketoconazole 2 % shampoo Commonly known as: NIZORAL Apply topically.   metoprolol tartrate 25 MG tablet Commonly known as: LOPRESSOR Take 1 tablet (25 mg total) by mouth daily.   montelukast 10 MG tablet Commonly known as: SINGULAIR TAKE 1 TABLET BY MOUTH EVERY DAY   omeprazole 40 MG capsule Commonly known as: PRILOSEC TAKE 1 CAPSULE BY MOUTH EVERY DAY What changed:  how much to take how to take this when to take  this additional instructions Changed by: Claretta Fraise, MD   SYRINGE/NEEDLE (DISP) 1 ML 23G X 1" 1 ML Misc 1 Syringe by Does not apply route every 30 (thirty) days.   valsartan 320 MG tablet Commonly known as: DIOVAN Take 1 tablet (320 mg total) by mouth daily.         Follow-up: Return in about 6 months (around 10/08/2021).  Claretta Fraise, M.D.

## 2021-04-10 NOTE — Addendum Note (Signed)
Addended by: Baldomero Lamy B on: 04/10/2021 10:30 AM   Modules accepted: Orders

## 2021-04-14 NOTE — Progress Notes (Signed)
Your chest x-ray looked normal. Thanks, WS.

## 2021-05-08 DIAGNOSIS — H401221 Low-tension glaucoma, left eye, mild stage: Secondary | ICD-10-CM | POA: Diagnosis not present

## 2021-06-13 ENCOUNTER — Ambulatory Visit (INDEPENDENT_AMBULATORY_CARE_PROVIDER_SITE_OTHER): Payer: Medicare HMO | Admitting: *Deleted

## 2021-06-13 DIAGNOSIS — Z23 Encounter for immunization: Secondary | ICD-10-CM

## 2021-06-16 ENCOUNTER — Telehealth: Payer: Self-pay | Admitting: Family Medicine

## 2021-06-16 NOTE — Telephone Encounter (Signed)
Pt c/o fever, chills, cough, congestion and weakness. Pt scheduled for televisit with MMM 1/10 at 9.

## 2021-06-17 ENCOUNTER — Encounter: Payer: Self-pay | Admitting: Nurse Practitioner

## 2021-06-17 ENCOUNTER — Ambulatory Visit (INDEPENDENT_AMBULATORY_CARE_PROVIDER_SITE_OTHER): Payer: Medicare HMO | Admitting: Nurse Practitioner

## 2021-06-17 DIAGNOSIS — R051 Acute cough: Secondary | ICD-10-CM

## 2021-06-17 MED ORDER — AZITHROMYCIN 250 MG PO TABS: ORAL_TABLET | ORAL | 0 refills | Status: DC

## 2021-06-17 MED ORDER — BENZONATATE 100 MG PO CAPS: 100.0000 mg | ORAL_CAPSULE | Freq: Three times a day (TID) | ORAL | 0 refills | Status: DC | PRN

## 2021-06-17 NOTE — Progress Notes (Signed)
Virtual Visit  Note Due to COVID-19 pandemic this visit was conducted virtually. This visit type was conducted due to national recommendations for restrictions regarding the COVID-19 Pandemic (e.g. social distancing, sheltering in place) in an effort to limit this patient's exposure and mitigate transmission in our community. All issues noted in this document were discussed and addressed.  A physical exam was not performed with this format.  I connected with Jimmy Park on 06/17/21 at 8:29 by telephone and verified that I am speaking with the correct person using two identifiers. Jimmy Park is currently located at home and no one is currently with him during visit. The provider, [name] Done, FNP is located in their office at time of visit.  I discussed the limitations, risks, security and privacy concerns of performing an evaluation and management service by telephone and the availability of in person appointments. I also discussed with the patient that there may be a patient responsible charge related to this service. The patient expressed understanding and agreed to proceed.   History and Present Illness:  URI  This is a new problem. The current episode started in the past 7 days. The problem has been gradually improving. The maximum temperature recorded prior to his arrival was 101 - 101.9 F. The fever has been present for 1 to 2 days. Associated symptoms include congestion, coughing, rhinorrhea and a sore throat. He has tried decongestant and acetaminophen for the symptoms. The treatment provided moderate relief. He feel much better today, but still has cough. Has slight sore throat. Has history of hydrocephaly.    Review of Systems  HENT:  Positive for congestion, rhinorrhea and sore throat.   Respiratory:  Positive for cough.     Observations/Objective: Alert and oriented- answers all questions appropriately No distress Dry cough   Assessment and Plan: Jimmy Park in  today with chief complaint of No chief complaint on file.   1. Acute cough 1. Take meds as prescribed 2. Use a cool mist humidifier especially during the winter months and when heat has been humid. 3. Use saline nose sprays frequently 4. Saline irrigations of the nose can be very helpful if done frequently.  * 4X daily for 1 week*  * Use of a nettie pot can be helpful with this. Follow directions with this* 5. Drink plenty of fluids 6. Keep thermostat turn down low 7.For any cough or congestion- tessalon perles 8. For fever or aces or pains- take tylenol or ibuprofen appropriate for age and weight.  * for fevers greater than 101 orally you may alternate ibuprofen and tylenol every  3 hours.   Meds ordered this encounter  Medications   azithromycin (ZITHROMAX Z-PAK) 250 MG tablet    Sig: As directed    Dispense:  6 tablet    Refill:  0    Order Specific Question:   Supervising Provider    Answer:   Caryl Pina A [[id number]]   benzonatate (TESSALON PERLES) 100 MG capsule    Sig: Take 1 capsule (100 mg total) by mouth 3 (three) times daily as needed for cough.    Dispense:  20 capsule    Refill:  0    Order Specific Question:   Supervising Provider    Answer:   Caryl Pina A [[id number]]      Follow Up Instructions: prn    I discussed the assessment and treatment plan with the patient. The patient was provided an opportunity to ask questions and all were answered. The  patient agreed with the plan and demonstrated an understanding of the instructions.   The patient was advised to call back or seek an in-person evaluation if the symptoms worsen or if the condition fails to improve as anticipated.  The above assessment and management plan was discussed with the patient. The patient verbalized understanding of and has agreed to the management plan. Patient is aware to call the clinic if symptoms persist or worsen. Patient is aware when to return to the clinic for a follow-up  visit. Patient educated on when it is appropriate to go to the emergency department.   Time call ended:  8:40  I provided 11 minutes of  non face-to-face time during this encounter.    [name] Done, FNP

## 2021-06-24 DIAGNOSIS — H401221 Low-tension glaucoma, left eye, mild stage: Secondary | ICD-10-CM | POA: Diagnosis not present

## 2021-07-12 ENCOUNTER — Other Ambulatory Visit: Payer: Self-pay | Admitting: Family Medicine

## 2021-07-12 DIAGNOSIS — J4520 Mild intermittent asthma, uncomplicated: Secondary | ICD-10-CM

## 2021-09-08 ENCOUNTER — Other Ambulatory Visit: Payer: Self-pay | Admitting: Family Medicine

## 2021-09-08 DIAGNOSIS — U071 COVID-19: Secondary | ICD-10-CM

## 2021-10-08 ENCOUNTER — Ambulatory Visit: Payer: Medicare HMO | Admitting: Family Medicine

## 2021-10-10 DIAGNOSIS — H401221 Low-tension glaucoma, left eye, mild stage: Secondary | ICD-10-CM | POA: Diagnosis not present

## 2021-10-29 ENCOUNTER — Ambulatory Visit (INDEPENDENT_AMBULATORY_CARE_PROVIDER_SITE_OTHER): Payer: Medicare HMO | Admitting: Family Medicine

## 2021-10-29 ENCOUNTER — Encounter: Payer: Self-pay | Admitting: Family Medicine

## 2021-10-29 ENCOUNTER — Encounter (INDEPENDENT_AMBULATORY_CARE_PROVIDER_SITE_OTHER): Payer: Medicare HMO | Admitting: Family Medicine

## 2021-10-29 ENCOUNTER — Other Ambulatory Visit: Payer: Self-pay | Admitting: Nurse Practitioner

## 2021-10-29 DIAGNOSIS — J4541 Moderate persistent asthma with (acute) exacerbation: Secondary | ICD-10-CM | POA: Diagnosis not present

## 2021-10-29 MED ORDER — PREDNISONE 20 MG PO TABS: ORAL_TABLET | ORAL | 0 refills | Status: DC

## 2021-10-29 MED ORDER — BENZONATATE 200 MG PO CAPS: 200.0000 mg | ORAL_CAPSULE | Freq: Two times a day (BID) | ORAL | 0 refills | Status: DC | PRN

## 2021-10-29 NOTE — Progress Notes (Signed)
Attempted to call twice and could not get through, patient's voicemail was not set up.

## 2021-10-29 NOTE — Progress Notes (Signed)
Virtual Visit via telephone Note  I connected with Jimmy Park on 10/29/21 at 1042 by telephone and verified that I am speaking with the correct person using two identifiers. Jimmy Park is currently located at home and patient are currently with her during visit. The provider, Fransisca Kaufmann Darien Kading, MD is located in their office at time of visit.  Call ended at 1050  I discussed the limitations, risks, security and privacy concerns of performing an evaluation and management service by telephone and the availability of in person appointments. I also discussed with the patient that there may be a patient responsible charge related to this service. The patient expressed understanding and agreed to proceed.   History and Present Illness: Patient is calling in for congestion and hoarseness and coughing.  He has been fighting this for several weeks.  He said yard work makes it worse.  He denies fever or chills.  He does have trouble breathing and wheezing with his asthma.  He is using inhalers and Advair and albuterol and breathing treatments. He just feels like it is not improving.   1. Moderate persistent asthma with acute exacerbation     Outpatient Encounter Medications as of 10/29/2021  Medication Sig   benzonatate (TESSALON) 200 MG capsule Take 1 capsule (200 mg total) by mouth 2 (two) times daily as needed for cough.   predniSONE (DELTASONE) 20 MG tablet 2 po at same time daily for 5 days   ADVAIR DISKUS 250-50 MCG/ACT AEPB TAKE 1 PUFF BY MOUTH TWICE A DAY   albuterol (VENTOLIN HFA) 108 (90 Base) MCG/ACT inhaler TAKE 2 PUFFS BY MOUTH EVERY 6 HOURS AS NEEDED FOR WHEEZE OR SHORTNESS OF BREATH   aspirin 81 MG tablet Take 1 tablet by mouth daily.   budesonide (PULMICORT) 0.5 MG/2ML nebulizer solution TAKE 2 MLS (0.5 MG TOTAL) BY NEBULIZATION 2 (TWO) TIMES DAILY AS NEEDED.   cetirizine (ZYRTEC) 10 MG tablet TAKE 1 TABLET BY MOUTH EVERY DAY   cyanocobalamin (,VITAMIN B-12,) 1000 MCG/ML  injection Inject 1 mL (1,000 mcg total) into the skin every 30 (thirty) days.   DENTAGEL 1.1 % GEL dental gel SMARTSIG:Application To Teeth   fluticasone (FLONASE) 50 MCG/ACT nasal spray SPRAY 2 SPRAYS INTO EACH NOSTRIL EVERY DAY   folic acid (FOLVITE) 1 MG tablet Take 1 tablet by mouth daily.   ibuprofen (ADVIL,MOTRIN) 400 MG tablet Take 1 tablet by mouth 2 (two) times daily as needed.   ketoconazole (NIZORAL) 2 % shampoo Apply topically.   metoprolol tartrate (LOPRESSOR) 25 MG tablet Take 1 tablet (25 mg total) by mouth daily.   montelukast (SINGULAIR) 10 MG tablet TAKE 1 TABLET BY MOUTH EVERY DAY   Omega-3 Fatty Acids (FISH OIL) 1000 MG CAPS Take by mouth.   omeprazole (PRILOSEC) 40 MG capsule TAKE 1 CAPSULE BY MOUTH EVERY DAY   SYRINGE/NEEDLE, DISP, 1 ML 23G X 1" 1 ML MISC 1 Syringe by Does not apply route every 30 (thirty) days.   valsartan (DIOVAN) 320 MG tablet Take 1 tablet (320 mg total) by mouth daily.   [DISCONTINUED] azithromycin (ZITHROMAX Z-PAK) 250 MG tablet As directed   [DISCONTINUED] benzonatate (TESSALON PERLES) 100 MG capsule Take 1 capsule (100 mg total) by mouth 3 (three) times daily as needed for cough.   [DISCONTINUED] brompheniramine-pseudoephedrine-DM 30-2-10 MG/5ML syrup Take 5 mLs by mouth 4 (four) times daily as needed.   No facility-administered encounter medications on file as of 10/29/2021.    Review of Systems  Constitutional:  Negative  for chills and fever.  HENT:  Positive for congestion. Negative for ear discharge, ear pain, postnasal drip, rhinorrhea, sinus pressure, sneezing, sore throat and voice change.   Eyes:  Negative for pain, discharge, redness and visual disturbance.  Respiratory:  Positive for cough, shortness of breath and wheezing.   Cardiovascular:  Negative for chest pain and leg swelling.  Musculoskeletal:  Negative for gait problem.  Skin:  Negative for rash.  All other systems reviewed and are  negative.  Observations/Objective: Patient sounds comfortable and in no acute distress, does sound hoarse though  Assessment and Plan: Problem List Items Addressed This Visit   None Visit Diagnoses     Moderate persistent asthma with acute exacerbation    -  Primary   Relevant Medications   benzonatate (TESSALON) 200 MG capsule   predniSONE (DELTASONE) 20 MG tablet       Likely asthma flareup with all the allergies and followed, will send short p.o. of prednisone with Tessalon Perles and if not improved then let us know.  He is to continue using his nebulizers and inhalers. Follow up plan: Return if symptoms worsen or fail to improve.     I discussed the assessment and treatment plan with the patient. The patient was provided an opportunity to ask questions and all were answered. The patient agreed with the plan and demonstrated an understanding of the instructions.   The patient was advised to call back or seek an in-person evaluation if the symptoms worsen or if the condition fails to improve as anticipated.  The above assessment and management plan was discussed with the patient. The patient verbalized understanding of and has agreed to the management plan. Patient is aware to call the clinic if symptoms persist or worsen. Patient is aware when to return to the clinic for a follow-up visit. Patient educated on when it is appropriate to go to the emergency department.    I provided 8 minutes of non-face-to-face time during this encounter.    Worthy Rancher, MD

## 2021-11-05 ENCOUNTER — Ambulatory Visit: Payer: Medicare HMO | Admitting: Family Medicine

## 2021-11-16 ENCOUNTER — Other Ambulatory Visit: Payer: Self-pay | Admitting: Family Medicine

## 2021-11-16 DIAGNOSIS — J4520 Mild intermittent asthma, uncomplicated: Secondary | ICD-10-CM

## 2021-12-03 ENCOUNTER — Other Ambulatory Visit: Payer: Medicare HMO

## 2021-12-03 DIAGNOSIS — E785 Hyperlipidemia, unspecified: Secondary | ICD-10-CM | POA: Diagnosis not present

## 2021-12-03 DIAGNOSIS — I10 Essential (primary) hypertension: Secondary | ICD-10-CM | POA: Diagnosis not present

## 2021-12-04 LAB — CBC WITH DIFFERENTIAL/PLATELET
Basophils Absolute: 0.1 10*3/uL (ref 0.0–0.2)
Basos: 1 %
EOS (ABSOLUTE): 0.2 10*3/uL (ref 0.0–0.4)
Eos: 4 %
Hematocrit: 45.9 % (ref 37.5–51.0)
Hemoglobin: 15.9 g/dL (ref 13.0–17.7)
Immature Grans (Abs): 0 10*3/uL (ref 0.0–0.1)
Immature Granulocytes: 0 %
Lymphocytes Absolute: 1.6 10*3/uL (ref 0.7–3.1)
Lymphs: 31 %
MCH: 32.4 pg (ref 26.6–33.0)
MCHC: 34.6 g/dL (ref 31.5–35.7)
MCV: 94 fL (ref 79–97)
Monocytes Absolute: 0.5 10*3/uL (ref 0.1–0.9)
Monocytes: 9 %
Neutrophils Absolute: 2.8 10*3/uL (ref 1.4–7.0)
Neutrophils: 55 %
Platelets: 211 10*3/uL (ref 150–450)
RBC: 4.91 x10E6/uL (ref 4.14–5.80)
RDW: 12 % (ref 11.6–15.4)
WBC: 5.1 10*3/uL (ref 3.4–10.8)

## 2021-12-04 LAB — CMP14+EGFR
ALT: 61 IU/L — ABNORMAL HIGH (ref 0–44)
AST: 53 IU/L — ABNORMAL HIGH (ref 0–40)
Albumin/Globulin Ratio: 1.8 (ref 1.2–2.2)
Albumin: 4.6 g/dL (ref 3.7–4.7)
Alkaline Phosphatase: 109 IU/L (ref 44–121)
BUN/Creatinine Ratio: 16 (ref 10–24)
BUN: 13 mg/dL (ref 8–27)
Bilirubin Total: 0.6 mg/dL (ref 0.0–1.2)
CO2: 25 mmol/L (ref 20–29)
Calcium: 9.4 mg/dL (ref 8.6–10.2)
Chloride: 99 mmol/L (ref 96–106)
Creatinine, Ser: 0.82 mg/dL (ref 0.76–1.27)
Globulin, Total: 2.5 g/dL (ref 1.5–4.5)
Glucose: 151 mg/dL — ABNORMAL HIGH (ref 70–99)
Potassium: 4.9 mmol/L (ref 3.5–5.2)
Sodium: 140 mmol/L (ref 134–144)
Total Protein: 7.1 g/dL (ref 6.0–8.5)
eGFR: 94 mL/min/{1.73_m2} (ref >59)

## 2021-12-04 LAB — LIPID PANEL
Chol/HDL Ratio: 4.6 ratio (ref 0.0–5.0)
Cholesterol, Total: 182 mg/dL (ref 100–199)
HDL: 40 mg/dL (ref >39)
LDL Chol Calc (NIH): 122 mg/dL — ABNORMAL HIGH (ref 0–99)
Triglycerides: 112 mg/dL (ref 0–149)
VLDL Cholesterol Cal: 20 mg/dL (ref 5–40)

## 2021-12-24 DIAGNOSIS — L218 Other seborrheic dermatitis: Secondary | ICD-10-CM | POA: Diagnosis not present

## 2021-12-24 DIAGNOSIS — Z1283 Encounter for screening for malignant neoplasm of skin: Secondary | ICD-10-CM | POA: Diagnosis not present

## 2021-12-24 DIAGNOSIS — Z85828 Personal history of other malignant neoplasm of skin: Secondary | ICD-10-CM | POA: Diagnosis not present

## 2022-02-05 DIAGNOSIS — H35033 Hypertensive retinopathy, bilateral: Secondary | ICD-10-CM | POA: Diagnosis not present

## 2022-02-05 DIAGNOSIS — H524 Presbyopia: Secondary | ICD-10-CM | POA: Diagnosis not present

## 2022-02-08 DIAGNOSIS — Z20822 Contact with and (suspected) exposure to covid-19: Secondary | ICD-10-CM | POA: Diagnosis not present

## 2022-02-08 DIAGNOSIS — J209 Acute bronchitis, unspecified: Secondary | ICD-10-CM | POA: Diagnosis not present

## 2022-02-09 DIAGNOSIS — J209 Acute bronchitis, unspecified: Secondary | ICD-10-CM | POA: Diagnosis not present

## 2022-02-09 DIAGNOSIS — Z20822 Contact with and (suspected) exposure to covid-19: Secondary | ICD-10-CM | POA: Diagnosis not present

## 2022-02-16 DIAGNOSIS — J069 Acute upper respiratory infection, unspecified: Secondary | ICD-10-CM | POA: Diagnosis not present

## 2022-02-16 DIAGNOSIS — J029 Acute pharyngitis, unspecified: Secondary | ICD-10-CM | POA: Diagnosis not present

## 2022-02-20 ENCOUNTER — Telehealth: Payer: Self-pay | Admitting: Family Medicine

## 2022-02-20 ENCOUNTER — Ambulatory Visit (INDEPENDENT_AMBULATORY_CARE_PROVIDER_SITE_OTHER): Payer: Medicare HMO | Admitting: Family Medicine

## 2022-02-20 ENCOUNTER — Encounter: Payer: Self-pay | Admitting: Family Medicine

## 2022-02-20 ENCOUNTER — Ambulatory Visit (INDEPENDENT_AMBULATORY_CARE_PROVIDER_SITE_OTHER): Payer: Medicare HMO

## 2022-02-20 DIAGNOSIS — R059 Cough, unspecified: Secondary | ICD-10-CM | POA: Diagnosis not present

## 2022-02-20 DIAGNOSIS — J4541 Moderate persistent asthma with (acute) exacerbation: Secondary | ICD-10-CM | POA: Diagnosis not present

## 2022-02-20 MED ORDER — BENZONATATE 200 MG PO CAPS: 200.0000 mg | ORAL_CAPSULE | Freq: Two times a day (BID) | ORAL | 0 refills | Status: DC | PRN

## 2022-02-20 MED ORDER — PREDNISONE 20 MG PO TABS: ORAL_TABLET | ORAL | 0 refills | Status: DC

## 2022-02-20 NOTE — Patient Instructions (Signed)
  Acute Bronchitis, Adult  Acute bronchitis is when air tubes in the lungs (bronchi) suddenly get swollen. The condition can make it hard for you to breathe. In adults, acute bronchitis usually goes away within 2 weeks. A cough caused by bronchitis may last up to 3 weeks. Smoking, allergies, and asthma can make the condition worse. What are the causes? Germs that cause cold and flu (viruses). The most common cause of this condition is the virus that causes the common cold. Bacteria. Substances that bother (irritate) the lungs, including: Smoke from cigarettes and other types of tobacco. Dust and pollen. Fumes from chemicals, gases, or burned fuel. Indoor or outdoor air pollution. What increases the risk? A weak body's defense system. This is also called the immune system. Any condition that affects your lungs and breathing, such as asthma. What are the signs or symptoms? A cough. Coughing up clear, yellow, or green mucus. Making high-pitched whistling sounds when you breathe, most often when you breathe out (wheezing). Runny or stuffy nose. Having too much mucus in your lungs (chest congestion). Shortness of breath. Body aches. A sore throat. How is this treated? Acute bronchitis may go away over time without treatment. Your doctor may tell you to: Drink more fluids. This will help thin your mucus so it is easier to cough up. Use a device that gets medicine into your lungs (inhaler). Use a vaporizer or a humidifier. These are machines that add water to the air. This helps with coughing and poor breathing. Take a medicine that thins mucus and helps clear it from your lungs. Take a medicine that prevents or stops coughing. It is not common to take an antibiotic medicine for this condition. Follow these instructions at home:  Take over-the-counter and prescription medicines only as told by your doctor. Use an inhaler, vaporizer, or humidifier as told by your doctor. Take two  teaspoons (10 mL) of honey at bedtime. This helps lessen your coughing at night. Drink enough fluid to keep your pee (urine) pale yellow. Do not smoke or use any products that contain nicotine or tobacco. If you need help quitting, ask your doctor. Get a lot of rest. Return to your normal activities when your doctor says that it is safe. Keep all follow-up visits. How is this prevented?  Wash your hands often with soap and water for at least 20 seconds. If you cannot use soap and water, use hand sanitizer. Avoid contact with people who have cold symptoms. Try not to touch your mouth, nose, or eyes with your hands. Avoid breathing in smoke or chemical fumes. Make sure to get the flu shot every year. Contact a doctor if: Your symptoms do not get better in 2 weeks. You have trouble coughing up the mucus. Your cough keeps you awake at night. You have a fever. Get help right away if: You cough up blood. You have chest pain. You have very bad shortness of breath. You faint or keep feeling like you are going to faint. You have a very bad headache. Your fever or chills get worse. These symptoms may be an emergency. Get help right away. Call your local emergency services (911 in the [place] Do not wait to see if the symptoms will go away. Do not drive yourself to the hospital. Summary Acute bronchitis is when air tubes in the lungs (bronchi) suddenly get swollen. In adults, acute bronchitis usually goes away within 2 weeks. Drink more fluids. This will help thin your mucus so it   is easier to cough up. Take over-the-counter and prescription medicines only as told by your doctor. Contact a doctor if your symptoms do not improve after 2 weeks of treatment. This information is not intended to replace advice given to you by your health care provider. Make sure you discuss any questions you have with your health care provider. Document Revised: 09/25/2020 Document Reviewed: 09/25/2020 Elsevier  Patient Education  2023 [employer]  

## 2022-02-20 NOTE — Progress Notes (Signed)
Telephone visit  Subjective: EG:BTDVV PCP: Claretta Fraise, MD OHY:Jimmy Park is a 71 y.o. male calls for telephone consult today. Patient provides verbal consent for consult held via phone.  Due to COVID-19 pandemic this visit was conducted virtually. This visit type was conducted due to national recommendations for restrictions regarding the COVID-19 Pandemic (e.g. social distancing, sheltering in place) in an effort to limit this patient's exposure and mitigate transmission in our community. All issues noted in this document were discussed and addressed.  A physical exam was not performed with this format.   Location of patient: home Location of provider: [facility] Others present for call: wife, Doris  1. Cough Using Tessalon perles, Zpak, Doxycycline (mild improvement).  Fever resolved.  Cough is persistent.  Has known asthma.  He was put on promethazine with dextromethorphan which helped a little.  He got a 5 day course of prednisone. Not coughing up brown or blood.  Sputum is slightly yellow.  Has 2 negative COVID tests.   ROS: Per HPI  No Known Allergies Past Medical History:  Diagnosis Date   Allergy    Anemia    Asthma    B12 deficiency    Cerebral ventriculomegaly    Gait abnormality    Hypertension    Pneumonia of left lung due to infectious organism 08/21/2020   URI with cough and congestion 08/21/2020    Current Outpatient Medications:    ADVAIR DISKUS 250-50 MCG/ACT AEPB, TAKE 1 PUFF BY MOUTH TWICE A DAY, Disp: 60 each, Rfl: 2   albuterol (VENTOLIN HFA) 108 (90 Base) MCG/ACT inhaler, TAKE 2 PUFFS BY MOUTH EVERY 6 HOURS AS NEEDED FOR WHEEZE OR SHORTNESS OF BREATH, Disp: 18 each, Rfl: 0   aspirin 81 MG tablet, Take 1 tablet by mouth daily., Disp: , Rfl:    benzonatate (TESSALON) 200 MG capsule, Take 1 capsule (200 mg total) by mouth 2 (two) times daily as needed for cough., Disp: 20 capsule, Rfl: 0   budesonide (PULMICORT) 0.5 MG/2ML nebulizer solution, TAKE 2 MLS (0.5 MG  TOTAL) BY NEBULIZATION 2 (TWO) TIMES DAILY AS NEEDED., Disp: 360 mL, Rfl: 0   cetirizine (ZYRTEC) 10 MG tablet, TAKE 1 TABLET BY MOUTH EVERY DAY, Disp: 90 tablet, Rfl: 1   cyanocobalamin (,VITAMIN B-12,) 1000 MCG/ML injection, Inject 1 mL (1,000 mcg total) into the skin every 30 (thirty) days., Disp: 3 mL, Rfl: 2   DENTAGEL 1.1 % GEL dental gel, SMARTSIG:Application To Teeth, Disp: , Rfl:    fluticasone (FLONASE) 50 MCG/ACT nasal spray, SPRAY 2 SPRAYS INTO EACH NOSTRIL EVERY DAY, Disp: 48 mL, Rfl: 1   folic acid (FOLVITE) 1 MG tablet, Take 1 tablet by mouth daily., Disp: , Rfl:    ibuprofen (ADVIL,MOTRIN) 400 MG tablet, Take 1 tablet by mouth 2 (two) times daily as needed., Disp: , Rfl:    ketoconazole (NIZORAL) 2 % shampoo, Apply topically., Disp: , Rfl:    metoprolol tartrate (LOPRESSOR) 25 MG tablet, Take 1 tablet (25 mg total) by mouth daily., Disp: 90 tablet, Rfl: 3   montelukast (SINGULAIR) 10 MG tablet, TAKE 1 TABLET BY MOUTH EVERY DAY, Disp: 90 tablet, Rfl: 1   Omega-3 Fatty Acids (FISH OIL) 1000 MG CAPS, Take by mouth., Disp: , Rfl:    omeprazole (PRILOSEC) 40 MG capsule, TAKE 1 CAPSULE BY MOUTH EVERY DAY, Disp: 90 capsule, Rfl: 2   predniSONE (DELTASONE) 20 MG tablet, 2 po at same time daily for 5 days, Disp: 10 tablet, Rfl: 0   SYRINGE/NEEDLE,  DISP, 1 ML 23G X 1" 1 ML MISC, 1 Syringe by Does not apply route every 30 (thirty) days., Disp: 6 each, Rfl: 1   valsartan (DIOVAN) 320 MG tablet, Take 1 tablet (320 mg total) by mouth daily., Disp: 90 tablet, Rfl: 2  Assessment/ Plan: 71 y.o. male   Moderate persistent asthma with acute exacerbation - Plan: predniSONE (DELTASONE) 20 MG tablet, benzonatate (TESSALON) 200 MG capsule, DG Chest 2 View  Uncertain etiology.  I am going to obtain a chest x-ray as he is now status post 2 rounds of antibiotics and a short course of steroid with persistent symptoms.  Has tested negative for COVID-19 x2.  Tessalon Perles renewed.  Continue cough syrup as  directed.  Pending chest x-ray results, we may need to consider treating with Levaquin if he does show any type of bacterial infiltrate.  Otherwise, supportive care recommended.  Start time: 9:50a End time: 10:01a  Total time spent on patient care (including telephone call/ virtual visit): 11 minutes  [place], [place] [phone]

## 2022-02-20 NOTE — Telephone Encounter (Signed)
Patient had virtual visit with [place] today and has came in for stat CXR - will call patient with results once received

## 2022-02-26 DIAGNOSIS — G919 Hydrocephalus, unspecified: Secondary | ICD-10-CM | POA: Diagnosis not present

## 2022-04-01 ENCOUNTER — Ambulatory Visit (INDEPENDENT_AMBULATORY_CARE_PROVIDER_SITE_OTHER): Payer: Medicare HMO

## 2022-04-01 DIAGNOSIS — Z23 Encounter for immunization: Secondary | ICD-10-CM

## 2022-04-26 ENCOUNTER — Other Ambulatory Visit: Payer: Self-pay | Admitting: Family Medicine

## 2022-04-26 DIAGNOSIS — U071 COVID-19: Secondary | ICD-10-CM

## 2022-05-04 ENCOUNTER — Telehealth (INDEPENDENT_AMBULATORY_CARE_PROVIDER_SITE_OTHER): Payer: Medicare HMO | Admitting: Family Medicine

## 2022-05-04 ENCOUNTER — Encounter: Payer: Self-pay | Admitting: Family Medicine

## 2022-05-04 DIAGNOSIS — J40 Bronchitis, not specified as acute or chronic: Secondary | ICD-10-CM | POA: Diagnosis not present

## 2022-05-04 MED ORDER — AMOXICILLIN-POT CLAVULANATE 875-125 MG PO TABS: 1.0000 | ORAL_TABLET | Freq: Two times a day (BID) | ORAL | 0 refills | Status: DC

## 2022-05-04 NOTE — Progress Notes (Signed)
Virtual Visit via telephone Note  I connected with Jimmy Park on 05/04/22 at 1318 by telephone and verified that I am speaking with the correct person using two identifiers. Jimmy Park is currently located at home and patient and wife  are currently with her during visit. The provider, Fransisca Kaufmann Romonia Yanik, MD is located in their office at time of visit.  Call ended at 1325  I discussed the limitations, risks, security and privacy concerns of performing an evaluation and management service by telephone and the availability of in person appointments. I also discussed with the patient that there may be a patient responsible charge related to this service. The patient expressed understanding and agreed to proceed.   History and Present Illness: Patient is calling in sore throat and congestion and earaches.  He had fever and chills but that has improved.  He started having symptoms about 6 days. He has some shortness of breath and wheezing. He is taking allegra and cough syndrome and they are helping. He uses flonase. His wife's covid test was negative.   1. Bronchitis     Outpatient Encounter Medications as of 05/04/2022  Medication Sig   amoxicillin-clavulanate (AUGMENTIN) 875-125 MG tablet Take 1 tablet by mouth 2 (two) times daily.   ADVAIR DISKUS 250-50 MCG/ACT AEPB TAKE 1 PUFF BY MOUTH TWICE A DAY   albuterol (VENTOLIN HFA) 108 (90 Base) MCG/ACT inhaler TAKE 2 PUFFS BY MOUTH EVERY 6 HOURS AS NEEDED FOR WHEEZE OR SHORTNESS OF BREATH   aspirin 81 MG tablet Take 1 tablet by mouth daily.   benzonatate (TESSALON) 200 MG capsule Take 1 capsule (200 mg total) by mouth 2 (two) times daily as needed for cough.   budesonide (PULMICORT) 0.5 MG/2ML nebulizer solution TAKE 2 MLS (0.5 MG TOTAL) BY NEBULIZATION 2 (TWO) TIMES DAILY AS NEEDED.   cetirizine (ZYRTEC) 10 MG tablet TAKE 1 TABLET BY MOUTH EVERY DAY   cyanocobalamin (,VITAMIN B-12,) 1000 MCG/ML injection Inject 1 mL (1,000 mcg total) into  the skin every 30 (thirty) days.   DENTAGEL 1.1 % GEL dental gel SMARTSIG:Application To Teeth   fluticasone (FLONASE) 50 MCG/ACT nasal spray SPRAY 2 SPRAYS INTO EACH NOSTRIL EVERY DAY   folic acid (FOLVITE) 1 MG tablet Take 1 tablet by mouth daily.   ibuprofen (ADVIL,MOTRIN) 400 MG tablet Take 1 tablet by mouth 2 (two) times daily as needed.   ketoconazole (NIZORAL) 2 % shampoo Apply topically.   metoprolol tartrate (LOPRESSOR) 25 MG tablet Take 1 tablet (25 mg total) by mouth daily.   montelukast (SINGULAIR) 10 MG tablet TAKE 1 TABLET BY MOUTH EVERY DAY   Omega-3 Fatty Acids (FISH OIL) 1000 MG CAPS Take by mouth.   omeprazole (PRILOSEC) 40 MG capsule TAKE 1 CAPSULE BY MOUTH EVERY DAY   predniSONE (DELTASONE) 20 MG tablet 2 po at same time daily for 5 days   promethazine-dextromethorphan (PROMETHAZINE-DM) 6.25-15 MG/5ML syrup Take 5 mLs by mouth at bedtime as needed.   SYRINGE/NEEDLE, DISP, 1 ML 23G X 1" 1 ML MISC 1 Syringe by Does not apply route every 30 (thirty) days.   valsartan (DIOVAN) 320 MG tablet Take 1 tablet (320 mg total) by mouth daily.   No facility-administered encounter medications on file as of 05/04/2022.    Review of Systems  Constitutional:  Positive for fever. Negative for chills.  HENT:  Positive for congestion, postnasal drip, rhinorrhea, sinus pressure and sore throat. Negative for ear discharge, ear pain, sneezing and voice change.   Eyes:  Negative for visual disturbance.  Respiratory:  Positive for cough, shortness of breath and wheezing.   Cardiovascular:  Negative for chest pain and leg swelling.  Musculoskeletal:  Negative for gait problem.  Skin:  Negative for rash.  All other systems reviewed and are negative.   Observations/Objective: Patient sounds comfortable and in no acute distress.   Assessment and Plan: Problem List Items Addressed This Visit   None Visit Diagnoses     Bronchitis    -  Primary   Relevant Medications    amoxicillin-clavulanate (AUGMENTIN) 875-125 MG tablet       Patient already has cough syrup, seems to be improving but not clearing up and having some shortness of breath and chest tightness so we will send in amoxicillin for him. Follow up plan: Return if symptoms worsen or fail to improve.     I discussed the assessment and treatment plan with the patient. The patient was provided an opportunity to ask questions and all were answered. The patient agreed with the plan and demonstrated an understanding of the instructions.   The patient was advised to call back or seek an in-person evaluation if the symptoms worsen or if the condition fails to improve as anticipated.  The above assessment and management plan was discussed with the patient. The patient verbalized understanding of and has agreed to the management plan. Patient is aware to call the clinic if symptoms persist or worsen. Patient is aware when to return to the clinic for a follow-up visit. Patient educated on when it is appropriate to go to the emergency department.    I provided 7 minutes of non-face-to-face time during this encounter.    Worthy Rancher, MD

## 2022-05-07 ENCOUNTER — Other Ambulatory Visit: Payer: Self-pay | Admitting: Family Medicine

## 2022-05-07 DIAGNOSIS — J4541 Moderate persistent asthma with (acute) exacerbation: Secondary | ICD-10-CM

## 2022-05-14 ENCOUNTER — Encounter: Payer: Self-pay | Admitting: Family Medicine

## 2022-05-14 ENCOUNTER — Ambulatory Visit (INDEPENDENT_AMBULATORY_CARE_PROVIDER_SITE_OTHER): Payer: Medicare HMO | Admitting: Family Medicine

## 2022-05-14 VITALS — BP 128/70 | HR 61 | Temp 98.2°F | Ht 67.0 in | Wt 183.1 lb

## 2022-05-14 DIAGNOSIS — J4541 Moderate persistent asthma with (acute) exacerbation: Secondary | ICD-10-CM | POA: Diagnosis not present

## 2022-05-14 MED ORDER — BENZONATATE 200 MG PO CAPS: 200.0000 mg | ORAL_CAPSULE | Freq: Two times a day (BID) | ORAL | 0 refills | Status: DC | PRN

## 2022-05-14 MED ORDER — PREDNISONE 20 MG PO TABS: ORAL_TABLET | ORAL | 0 refills | Status: DC

## 2022-05-14 MED ORDER — PROMETHAZINE-DM 6.25-15 MG/5ML PO SYRP: 5.0000 mL | ORAL_SOLUTION | Freq: Every evening | ORAL | 0 refills | Status: DC | PRN

## 2022-05-14 NOTE — Progress Notes (Signed)
Acute Office Visit  Subjective:     Patient ID: Jimmy Park, male    DOB: May 22, 1951, 71 y.o.   MRN: [id number]  Chief Complaint  Patient presents with   Cough    Cough This is a new problem. Episode onset: 7-10 days. The problem has been unchanged. The cough is Non-productive. Associated symptoms include ear pain, nasal congestion, a sore throat, shortness of breath and wheezing. Pertinent negatives include no chest pain, chills, ear congestion, fever, headaches, hemoptysis or myalgias. The symptoms are aggravated by cold air. He has tried a beta-agonist inhaler and steroid inhaler for the symptoms. The treatment provided mild relief. His past medical history is significant for asthma.   He has been using his advair one a day and albuterol once a day. He has had 2 nebulizer treatments today which did help.   Review of Systems  Constitutional:  Negative for chills and fever.  HENT:  Positive for ear pain and sore throat.   Respiratory:  Positive for cough, shortness of breath and wheezing. Negative for hemoptysis.   Cardiovascular:  Negative for chest pain.  Musculoskeletal:  Negative for myalgias.  Neurological:  Negative for headaches.        Objective:    BP 128/70   Pulse 61   Temp 98.2 F (36.8 C) (Temporal)   Ht '5\' 7"'$  (1.702 m)   Wt 183 lb 2 oz (83.1 kg)   SpO2 96%   BMI 28.68 kg/m    Physical Exam Vitals and nursing note reviewed.  Constitutional:      General: He is not in acute distress.    Appearance: He is not ill-appearing, toxic-appearing or diaphoretic.  HENT:     Head:     Salivary Glands: Right salivary gland is not diffusely enlarged or tender. Left salivary gland is not diffusely enlarged or tender.     Right Ear: Tympanic membrane, ear canal and external ear normal.     Left Ear: Tympanic membrane and ear canal normal.     Nose: Congestion present.     Mouth/Throat:     Mouth: Mucous membranes are moist.     Pharynx: Oropharynx is clear.   Eyes:     General:        Right eye: No discharge.        Left eye: No discharge.  Cardiovascular:     Rate and Rhythm: Normal rate and regular rhythm.     Heart sounds: Normal heart sounds. No murmur heard. Pulmonary:     Effort: Pulmonary effort is normal. No respiratory distress.     Breath sounds: Normal breath sounds. No wheezing, rhonchi or rales.  Musculoskeletal:     Cervical back: Normal range of motion. No rigidity.  Neurological:     Mental Status: He is alert and oriented to person, place, and time. Mental status is at baseline.  Psychiatric:        Mood and Affect: Mood normal.        Behavior: Behavior normal.     No results found for any visits on 05/14/22.      Assessment & Plan:   Jimmy Park was seen today for cough.  Diagnoses and all orders for this visit:  Moderate persistent asthma with acute exacerbation Prednisone as below. Discussed compliance with inhaler regimen. Albuterol and nebulizer prn. Cough medication as needed. Return to office for new or worsening symptoms, or if symptoms persist.  -     benzonatate (TESSALON) 200 MG capsule;  Take 1 capsule (200 mg total) by mouth 2 (two) times daily as needed for cough. -     promethazine-dextromethorphan (PROMETHAZINE-DM) 6.25-15 MG/5ML syrup; Take 5 mLs by mouth at bedtime as needed. -     predniSONE (DELTASONE) 20 MG tablet; 2 po at same time daily for 5 days  The patient indicates understanding of these issues and agrees with the plan.   Gwenlyn Perking, FNP

## 2022-05-17 ENCOUNTER — Other Ambulatory Visit: Payer: Self-pay | Admitting: Family Medicine

## 2022-05-30 ENCOUNTER — Other Ambulatory Visit: Payer: Self-pay | Admitting: Family Medicine

## 2022-05-30 DIAGNOSIS — I10 Essential (primary) hypertension: Secondary | ICD-10-CM

## 2022-06-02 ENCOUNTER — Other Ambulatory Visit: Payer: Self-pay | Admitting: Family Medicine

## 2022-06-02 NOTE — Telephone Encounter (Signed)
30 day supply sent into pharmacy. Please schedule with PCP for follow up.

## 2022-06-02 NOTE — Telephone Encounter (Signed)
Pt made appt for 07/07/2022, please send refill to pharm

## 2022-06-03 ENCOUNTER — Other Ambulatory Visit: Payer: Self-pay | Admitting: Family Medicine

## 2022-06-03 DIAGNOSIS — I10 Essential (primary) hypertension: Secondary | ICD-10-CM

## 2022-06-10 ENCOUNTER — Other Ambulatory Visit: Payer: Self-pay | Admitting: Family Medicine

## 2022-06-10 ENCOUNTER — Ambulatory Visit (INDEPENDENT_AMBULATORY_CARE_PROVIDER_SITE_OTHER): Payer: Medicare HMO

## 2022-06-10 ENCOUNTER — Other Ambulatory Visit: Payer: Self-pay

## 2022-06-10 VITALS — Ht 67.0 in | Wt 183.0 lb

## 2022-06-10 DIAGNOSIS — Z0000 Encounter for general adult medical examination without abnormal findings: Secondary | ICD-10-CM | POA: Diagnosis not present

## 2022-06-10 DIAGNOSIS — J4541 Moderate persistent asthma with (acute) exacerbation: Secondary | ICD-10-CM

## 2022-06-10 MED ORDER — BENZONATATE 200 MG PO CAPS: 200.0000 mg | ORAL_CAPSULE | Freq: Two times a day (BID) | ORAL | 0 refills | Status: DC | PRN

## 2022-06-10 NOTE — Telephone Encounter (Signed)
Patient aware and verbalized understanding. °

## 2022-06-10 NOTE — Telephone Encounter (Signed)
Patient seen for AWV and is requesting a refill of Tessalon Perles for a lingering cough.

## 2022-06-10 NOTE — Telephone Encounter (Signed)
Please let the patient know that I sent their prescription to their pharmacy. Thanks, WS 

## 2022-06-10 NOTE — Progress Notes (Signed)
Subjective:   Jimmy Park is a 72 y.o. male who presents for Medicare Annual/Subsequent preventive examination.  I connected with  Jimmy Park on 06/10/22 by a audio enabled telemedicine application and verified that I am speaking with the correct person using two identifiers.  Patient Location: Home  Provider Location: Office/Clinic  I discussed the limitations of evaluation and management by telemedicine. The patient expressed understanding and agreed to proceed.  Review of Systems     Cardiac Risk Factors include: advanced age (>32mn, >>75women);hypertension;male gender     Objective:    Today's Vitals   06/10/22 0950  Weight: 183 lb (83 kg)  Height: '5\' 7"'$  (1.702 m)   Body mass index is 28.66 kg/m.     06/10/2022   10:43 AM 02/24/2021   10:15 AM 02/23/2020   10:19 AM 02/09/2019   10:33 AM 01/09/2015    9:54 AM  Advanced Directives  Does Patient Have a Medical Advance Directive? Yes Yes Yes Yes Yes  Type of Advance Directive Living will;Healthcare Power of [place]   Does patient want to make changes to medical advance directive? No - Patient declined  No - Patient declined No - Patient declined   Copy of [place] Chart? No - copy requested No - copy requested  No - copy requested     Current Medications (verified) Outpatient Encounter Medications as of 06/10/2022  Medication Sig   ADVAIR DISKUS 250-50 MCG/ACT AEPB TAKE 1 PUFF BY MOUTH TWICE A DAY   albuterol (VENTOLIN HFA) 108 (90 Base) MCG/ACT inhaler TAKE 2 PUFFS BY MOUTH EVERY 6 HOURS AS NEEDED FOR WHEEZE OR SHORTNESS OF BREATH   aspirin 81 MG tablet Take 1 tablet by mouth daily.   benzonatate (TESSALON) 200 MG capsule Take 1 capsule (200 mg total) by mouth 2 (two) times daily as needed for cough.   budesonide (PULMICORT) 0.5 MG/2ML nebulizer solution TAKE 2 MLS (0.5 MG  TOTAL) BY NEBULIZATION 2 (TWO) TIMES DAILY AS NEEDED.   cetirizine (ZYRTEC) 10 MG tablet TAKE 1 TABLET BY MOUTH EVERY DAY   cyanocobalamin (,VITAMIN B-12,) 1000 MCG/ML injection Inject 1 mL (1,000 mcg total) into the skin every 30 (thirty) days.   DENTAGEL 1.1 % GEL dental gel SMARTSIG:Application To Teeth   fluticasone (FLONASE) 50 MCG/ACT nasal spray SPRAY 2 SPRAYS INTO EACH NOSTRIL EVERY DAY   folic acid (FOLVITE) 1 MG tablet Take 1 tablet by mouth daily.   ibuprofen (ADVIL,MOTRIN) 400 MG tablet Take 1 tablet by mouth 2 (two) times daily as needed.   ketoconazole (NIZORAL) 2 % shampoo Apply topically.   metoprolol tartrate (LOPRESSOR) 25 MG tablet Take 1 tablet (25 mg total) by mouth daily.   montelukast (SINGULAIR) 10 MG tablet TAKE 1 TABLET BY MOUTH EVERY DAY   Omega-3 Fatty Acids (FISH OIL) 1000 MG CAPS Take by mouth.   omeprazole (PRILOSEC) 40 MG capsule TAKE 1 CAPSULE BY MOUTH EVERY DAY   promethazine-dextromethorphan (PROMETHAZINE-DM) 6.25-15 MG/5ML syrup Take 5 mLs by mouth at bedtime as needed.   SYRINGE/NEEDLE, DISP, 1 ML 23G X 1" 1 ML MISC 1 Syringe by Does not apply route every 30 (thirty) days.   valsartan (DIOVAN) 320 MG tablet Take 1 tablet (320 mg total) by mouth daily. Needs appointment with PCP for refills   [DISCONTINUED] amoxicillin-clavulanate (AUGMENTIN) 875-125 MG tablet Take 1 tablet by mouth 2 (two) times daily.   [  DISCONTINUED] predniSONE (DELTASONE) 20 MG tablet 2 po at same time daily for 5 days   No facility-administered encounter medications on file as of 06/10/2022.    Allergies (verified) Patient has no known allergies.   History: Past Medical History:  Diagnosis Date   Allergy    Anemia    Asthma    B12 deficiency    Cerebral ventriculomegaly    Gait abnormality    Hypertension    Pneumonia of left lung due to infectious organism 08/21/2020   URI with cough and congestion 08/21/2020   Past Surgical History:  Procedure Laterality Date   LUMBAR  DRAIN IMPLANTATION     Family History  Problem Relation Age of Onset   Diabetes Mother    Hypertension Mother    Diabetes Father    Hypertension Father    Asthma Brother    Social History   Socioeconomic History   Marital status: Married    Spouse name: [name]   Number of children: 4   Years of education: 12   Highest education level: High school graduate  Occupational History   Occupation: retired  Tobacco Use   Smoking status: Former    Packs/day: 0.50    Types: Cigarettes    Start date: 06/09/1967    Quit date: 06/08/1973    Years since quitting: 49.0   Smokeless tobacco: Never  Vaping Use   Vaping Use: Never used  Substance and Sexual Activity   Alcohol use: Not Currently    Alcohol/week: 0.0 standard drinks of alcohol   Drug use: No   Sexual activity: Not Currently  Other Topics Concern   Not on file  Social History Narrative   Lives home with wife and her son   Social Determinants of Health   Financial Resource Strain: [place]  (06/10/2022)   Overall Financial Resource Strain (CARDIA)    Difficulty of Paying Living Expenses: Not hard at all  Food Insecurity: No [place] (06/10/2022)   Hunger Vital Sign    Worried About Running Out of Food in the Last Year: Never true    [place] in the Last Year: Never true  Transportation Needs: No Transportation Needs (06/10/2022)   PRAPARE - Hydrologist (Medical): No    Lack of Transportation (Non-Medical): No  Physical Activity: Inactive (06/10/2022)   Exercise Vital Sign    Days of Exercise per Week: 0 days    Minutes of Exercise per Session: 0 min  Stress: No Stress Concern Present (06/10/2022)   [place]    Feeling of Stress : Not at all  Social Connections: Moderately Integrated (06/10/2022)   Social Connection and Isolation Panel [NHANES]    Frequency of Communication with Friends and Family: More than three  times a week    Frequency of Social Gatherings with Friends and Family: Three times a week    Attends Religious Services: 1 to 4 times per year    Active Member of Clubs or Organizations: No    Attends Archivist Meetings: Never    Marital Status: Married    Tobacco Counseling Counseling given: Not Answered   Clinical Intake:  Pre-visit preparation completed: Yes  Pain : No/denies pain  Diabetes: No  How often do you need to have someone help you when you read instructions, pamphlets, or other written materials from your doctor or pharmacy?: 1 - Never  Diabetic?No   Interpreter Needed?:  No  Information entered by :: Denman George LPN   Activities of Daily Living    06/10/2022   10:46 AM  In your present state of health, do you have any difficulty performing the following activities:  Hearing? 0  Vision? 0  Difficulty concentrating or making decisions? 0  Walking or climbing stairs? 1  Dressing or bathing? 0  Doing errands, shopping? 0  Preparing Food and eating ? N  Using the Toilet? N  In the past six months, have you accidently leaked urine? N  Do you have problems with loss of bowel control? N  Managing your Medications? N  Managing your Finances? N  Housekeeping or managing your Housekeeping? N    Patient Care Team: Claretta Fraise, MD as PCP - General (Family Medicine) Sandford Craze, MD as Referring Physician (Dermatology) Leticia Clas, [place] (Optometry) Luther Hearing Purvis Sheffield, MD as Consulting Physician (Neurosurgery)  Indicate any recent Medical Services you may have received from other than [facility] providers in the past year (date may be approximate).     Assessment:   This is a routine wellness examination for Can.  Hearing/Vision screen Hearing Screening - Comments:: Denies hearing difficulties   Vision Screening - Comments:: Wears rx glasses - up to date with routine eye exams with Dr. Leticia Clas    Dietary issues and exercise  activities discussed: Current Exercise Habits: The patient does not participate in regular exercise at present   Goals Addressed             This Visit's Progress    Prevent falls   On track     Depression Screen    06/10/2022   10:41 AM 05/14/2022    3:36 PM 04/10/2021    9:04 AM 02/24/2021   10:15 AM 10/08/2020    9:14 AM 04/10/2020    8:55 AM 02/23/2020   10:20 AM  PHQ 2/9 Scores  PHQ - 2 Score 1 1 0 0 0 0 0  PHQ- 9 Score '3 2 1        '$ Fall Risk    06/10/2022   10:03 AM 05/14/2022    3:36 PM 04/10/2021    9:01 AM 02/24/2021   10:17 AM 10/08/2020    9:20 AM  [place] in the past year? '1 1 1 1 1  '$ Number falls in past yr: 0 0 1 1 0  Injury with Fall? 0 0 0 0 0  Risk for fall due to : History of fall(s);Impaired mobility;Impaired balance/gait History of fall(s);Impaired balance/gait  Impaired balance/gait;History of fall(s) History of fall(s)  Follow up Falls evaluation completed;Education provided;Falls prevention discussed Falls evaluation completed Falls evaluation completed Education provided;Falls prevention discussed Falls evaluation completed    FALL RISK PREVENTION PERTAINING TO THE HOME:  Any stairs in or around the home? No  If so, are there any without handrails? No  Home free of loose throw rugs in walkways, pet beds, electrical cords, etc? Yes  Adequate lighting in your home to reduce risk of falls? Yes   ASSISTIVE DEVICES UTILIZED TO PREVENT FALLS:  Life alert? No  Use of a cane, walker or w/c? Yes  Grab bars in the bathroom? Yes  Shower chair or bench in shower? No  Elevated toilet seat or a handicapped toilet? Yes   TIMED UP AND GO:  Was the test performed? No . Telephonic visit   Cognitive Function:        06/10/2022   10:47 AM 02/23/2020  10:25 AM 02/23/2020   10:21 AM 02/09/2019   10:40 AM  6CIT Screen  What Year? 0 points  0 points 0 points  What month? 0 points  0 points 0 points  What time? 0 points  0 points 0 points  Count back from  20 0 points  0 points 0 points  Months in reverse 0 points  0 points 0 points  Repeat phrase 2 points 2 points 0 points 4 points  Total Score 2 points  0 points 4 points    Immunizations Immunization History  Administered Date(s) Administered   DTaP 09/18/2007   Fluad Quad(high Dose 65+) 03/08/2019, 03/22/2020, 03/10/2021, 04/01/2022   Influenza Whole 05/14/2010, 07/01/2011, 02/15/2012, 03/08/2013, 02/26/2014   Influenza, High Dose Seasonal PF 03/17/2018   Influenza,inj,Quad PF,6+ Mos 03/27/2015, 02/26/2016   Influenza-Unspecified 03/17/2017   Moderna Covid-19 Vaccine Bivalent Booster 37yr & up 05/07/2021   Moderna SARS-COV2 Booster Vaccination 01/08/2021   Moderna Sars-Covid-2 Vaccination 08/02/2019, 08/30/2019, 04/24/2020   Pneumococcal Conjugate-13 08/06/2017   Pneumococcal Polysaccharide-23 08/09/2018   Pneumococcal-Unspecified 02/15/2012   Td 08/09/2018   Tdap 09/18/2007   Zoster Recombinat (Shingrix) 04/10/2021, 06/13/2021    TDAP status: Up to date  Flu Vaccine status: Up to date  Pneumococcal vaccine status: Up to date  Covid-19 vaccine status: Information provided on how to obtain vaccines.   Qualifies for Shingles Vaccine? Yes   Zostavax completed No   Shingrix Completed?: Yes  Screening Tests Health Maintenance  Topic Date Due   Medicare Annual Wellness (AWV)  06/11/2023   COLONOSCOPY (Pts 45-476yrInsurance coverage will need to be confirmed)  05/08/2024   DTaP/Tdap/Td (4 - Td or Tdap) 08/08/2028   Pneumonia Vaccine 6571Years old  Completed   INFLUENZA VACCINE  Completed   COVID-19 Vaccine  Completed   Hepatitis C Screening  Completed   Zoster Vaccines- Shingrix  Completed   HPV VACCINES  Aged Out    Health Maintenance  There are no preventive care reminders to display for this patient.   Colorectal cancer screening: Type of screening: Colonoscopy. Completed 04/13/14. Repeat every 10 years  Lung Cancer Screening: (Low Dose CT Chest recommended if  Age 73-80ears, 30 pack-year currently smoking OR have quit w/in 15years.) does not qualify.   Lung Cancer Screening Referral: n/a   Additional Screening:  Hepatitis C Screening: does qualify; Completed 04/10/20  Vision Screening: Recommended annual ophthalmology exams for early detection of glaucoma and other disorders of the eye. Is the patient up to date with their annual eye exam?  Yes  Who is the provider or what is the name of the office in which the patient attends annual eye exams? Dr. RaLeticia ClasIf pt is not established with a provider, would they like to be referred to a provider to establish care? No .   Dental Screening: Recommended annual dental exams for proper oral hygiene  Community Resource Referral / Chronic Care Management: CRR required this visit?  No   CCM required this visit?  No      Plan:     I have personally reviewed and noted the following in the patient's chart:   Medical and social history Use of alcohol, tobacco or illicit drugs  Current medications and supplements including opioid prescriptions. Patient is not currently taking opioid prescriptions. Functional ability and status Nutritional status Physical activity Advanced directives List of other physicians Hospitalizations, surgeries, and ER visits in previous 12 months Vitals Screenings to include cognitive, depression, and falls Referrals  and appointments  In addition, I have reviewed and discussed with patient certain preventive protocols, quality metrics, and best practice recommendations. A written personalized care plan for preventive services as well as general preventive health recommendations were provided to patient.     Vanetta Mulders, [place]   3/0/8657   Due to this being a virtual visit, the after visit summary with patients personalized plan was offered to patient via mail or my-chart. Patient would like to access on my-chart  Nurse Notes: Patient is  requesting a refill of Tessalon Perles for lingering cough.  Will route as refill request.

## 2022-06-11 ENCOUNTER — Other Ambulatory Visit: Payer: Self-pay | Admitting: Family Medicine

## 2022-06-11 DIAGNOSIS — E538 Deficiency of other specified B group vitamins: Secondary | ICD-10-CM

## 2022-06-11 DIAGNOSIS — J189 Pneumonia, unspecified organism: Secondary | ICD-10-CM

## 2022-06-11 DIAGNOSIS — H401221 Low-tension glaucoma, left eye, mild stage: Secondary | ICD-10-CM | POA: Diagnosis not present

## 2022-06-11 DIAGNOSIS — J4541 Moderate persistent asthma with (acute) exacerbation: Secondary | ICD-10-CM

## 2022-06-11 MED ORDER — BUDESONIDE 0.5 MG/2ML IN SUSP: 0.5000 mg | Freq: Two times a day (BID) | RESPIRATORY_TRACT | 0 refills | Status: DC | PRN

## 2022-06-14 DIAGNOSIS — H6503 Acute serous otitis media, bilateral: Secondary | ICD-10-CM | POA: Diagnosis not present

## 2022-06-14 DIAGNOSIS — J209 Acute bronchitis, unspecified: Secondary | ICD-10-CM | POA: Diagnosis not present

## 2022-06-23 ENCOUNTER — Other Ambulatory Visit: Payer: Self-pay | Admitting: Family Medicine

## 2022-06-23 DIAGNOSIS — J4520 Mild intermittent asthma, uncomplicated: Secondary | ICD-10-CM

## 2022-06-23 DIAGNOSIS — I10 Essential (primary) hypertension: Secondary | ICD-10-CM

## 2022-06-29 ENCOUNTER — Encounter: Payer: Self-pay | Admitting: Family Medicine

## 2022-06-29 ENCOUNTER — Telehealth (INDEPENDENT_AMBULATORY_CARE_PROVIDER_SITE_OTHER): Payer: Medicare HMO | Admitting: Family Medicine

## 2022-06-29 DIAGNOSIS — J069 Acute upper respiratory infection, unspecified: Secondary | ICD-10-CM | POA: Diagnosis not present

## 2022-06-29 DIAGNOSIS — R059 Cough, unspecified: Secondary | ICD-10-CM

## 2022-06-29 DIAGNOSIS — R062 Wheezing: Secondary | ICD-10-CM | POA: Diagnosis not present

## 2022-06-29 MED ORDER — ALBUTEROL SULFATE HFA 108 (90 BASE) MCG/ACT IN AERS: 2.0000 | INHALATION_SPRAY | Freq: Four times a day (QID) | RESPIRATORY_TRACT | 2 refills | Status: DC | PRN

## 2022-06-29 MED ORDER — PREDNISONE 10 MG (21) PO TBPK: ORAL_TABLET | ORAL | 0 refills | Status: DC

## 2022-06-29 MED ORDER — AZITHROMYCIN 250 MG PO TABS: ORAL_TABLET | ORAL | 0 refills | Status: DC

## 2022-06-29 NOTE — Progress Notes (Signed)
Virtual Visit via telephone Note Due to COVID-19 pandemic this visit was conducted virtually. This visit type was conducted due to national recommendations for restrictions regarding the COVID-19 Pandemic (e.g. social distancing, sheltering in place) in an effort to limit this patient's exposure and mitigate transmission in our community. All issues noted in this document were discussed and addressed.  A physical exam was not performed with this format.   I connected with Jimmy Park on 06/29/2022 at 1122 by telephone and verified that I am speaking with the correct person using two identifiers. Jimmy Park is currently located at home and  wife  is currently with them during visit. The provider, Monia Pouch, FNP is located in their office at time of visit.  I discussed the limitations, risks, security and privacy concerns of performing an evaluation and management service by virtual visit and the availability of in person appointments. I also discussed with the patient that there may be a patient responsible charge related to this service. The patient expressed understanding and agreed to proceed.  Subjective:  Patient ID: Jimmy Park, male    DOB: Dec 26, 1950, 72 y.o.   MRN: [id number]  Chief Complaint:  Cough   HPI: Jimmy Park is a 72 y.o. male presenting on 06/29/2022 for Cough   Pt presents today with complaints of ongoing cough, chest congestion, sputum production, and wheezing. He was [facility] at [facility] and treated for bronchitis with a short burst of steroids, mucinex, robitussin, and amoxicillin. States he has not improved. Continues to cough and wheeze. Has not been using Albuterol throughout the day, only in the mornings.   Cough This is a recurrent problem. Episode onset: 7-10 days ago. The problem has been unchanged. The problem occurs constantly. The cough is Productive of purulent sputum. Associated symptoms include ear congestion, shortness of breath and wheezing. Pertinent  negatives include no chest pain, chills, ear pain, fever, headaches, heartburn, hemoptysis, myalgias, nasal congestion, postnasal drip, rash, rhinorrhea, sore throat, sweats or weight loss. He has tried a beta-agonist inhaler, OTC cough suppressant, prescription cough suppressant, rest and oral steroids for the symptoms. The treatment provided no relief.     Relevant past medical, surgical, family, and social history reviewed and updated as indicated.  Allergies and medications reviewed and updated.   Past Medical History:  Diagnosis Date   Allergy    Anemia    Asthma    B12 deficiency    Cerebral ventriculomegaly    Gait abnormality    Hypertension    Pneumonia of left lung due to infectious organism 08/21/2020   URI with cough and congestion 08/21/2020    Past Surgical History:  Procedure Laterality Date   LUMBAR DRAIN IMPLANTATION      Social History   Socioeconomic History   Marital status: Married    Spouse name: [name]   Number of children: 4   Years of education: 12   Highest education level: High school graduate  Occupational History   Occupation: retired  Tobacco Use   Smoking status: Former    Packs/day: 0.50    Types: Cigarettes    Start date: 06/09/1967    Quit date: 06/08/1973    Years since quitting: 49.0   Smokeless tobacco: Never  Vaping Use   Vaping Use: Never used  Substance and Sexual Activity   Alcohol use: Not Currently    Alcohol/week: 0.0 standard drinks of alcohol   Drug use: No   Sexual activity: Not Currently  Other Topics Concern  Not on file  Social History Narrative   Lives home with wife and her son   Social Determinants of Health   Financial Resource Strain: Low Risk  (06/10/2022)   Overall Financial Resource Strain (CARDIA)    Difficulty of Paying Living Expenses: Not hard at all  Food Insecurity: No Food Insecurity (06/10/2022)   Hunger Vital Sign    Worried About Running Out of Food in the Last Year: Never true    Ran Out of  Food in the Last Year: Never true  Transportation Needs: No Transportation Needs (06/10/2022)   PRAPARE - Hydrologist (Medical): No    Lack of Transportation (Non-Medical): No  Physical Activity: Inactive (06/10/2022)   Exercise Vital Sign    Days of Exercise per Week: 0 days    Minutes of Exercise per Session: 0 min  Stress: No Stress Concern Present (06/10/2022)   [place]    Feeling of Stress : Not at all  Social Connections: Moderately Integrated (06/10/2022)   Social Connection and Isolation Panel [NHANES]    Frequency of Communication with Friends and Family: More than three times a week    Frequency of Social Gatherings with Friends and Family: Three times a week    Attends Religious Services: 1 to 4 times per year    Active Member of Clubs or Organizations: No    Attends Archivist Meetings: Never    Marital Status: Married  Human resources officer Violence: Not At Risk (06/10/2022)   Humiliation, Afraid, Rape, and Kick questionnaire    Fear of Current or Ex-Partner: No    Emotionally Abused: No    Physically Abused: No    Sexually Abused: No    Outpatient Encounter Medications as of 06/29/2022  Medication Sig   albuterol (VENTOLIN HFA) 108 (90 Base) MCG/ACT inhaler Inhale 2 puffs into the lungs every 6 (six) hours as needed for wheezing or shortness of breath.   azithromycin (ZITHROMAX Z-PAK) 250 MG tablet As directed   predniSONE (STERAPRED UNI-PAK 21 TAB) 10 MG (21) TBPK tablet As directed x 6 days   ADVAIR DISKUS 250-50 MCG/ACT AEPB TAKE 1 PUFF BY MOUTH TWICE A DAY   albuterol (VENTOLIN HFA) 108 (90 Base) MCG/ACT inhaler TAKE 2 PUFFS BY MOUTH EVERY 6 HOURS AS NEEDED FOR WHEEZE OR SHORTNESS OF BREATH   aspirin 81 MG tablet Take 1 tablet by mouth daily.   benzonatate (TESSALON) 200 MG capsule Take 1 capsule (200 mg total) by mouth 2 (two) times daily as needed for cough.    budesonide (PULMICORT) 0.5 MG/2ML nebulizer solution Take 2 mLs (0.5 mg total) by nebulization 2 (two) times daily as needed.   cetirizine (ZYRTEC) 10 MG tablet TAKE 1 TABLET BY MOUTH EVERY DAY   cyanocobalamin (VITAMIN B12) 1000 MCG/ML injection INJECT 1 ML INTO THE SKIN EVERY 30 (THIRTY) DAYS.   DENTAGEL 1.1 % GEL dental gel SMARTSIG:Application To Teeth   fluticasone (FLONASE) 50 MCG/ACT nasal spray SPRAY 2 SPRAYS INTO EACH NOSTRIL EVERY DAY   folic acid (FOLVITE) 1 MG tablet Take 1 tablet by mouth daily.   ibuprofen (ADVIL,MOTRIN) 400 MG tablet Take 1 tablet by mouth 2 (two) times daily as needed.   ketoconazole (NIZORAL) 2 % shampoo Apply topically.   metoprolol tartrate (LOPRESSOR) 25 MG tablet TAKE 1 TABLET (25 MG TOTAL) BY MOUTH DAILY.   montelukast (SINGULAIR) 10 MG tablet TAKE 1 TABLET BY MOUTH EVERY DAY  Omega-3 Fatty Acids (FISH OIL) 1000 MG CAPS Take by mouth.   omeprazole (PRILOSEC) 40 MG capsule TAKE 1 CAPSULE BY MOUTH EVERY DAY   promethazine-dextromethorphan (PROMETHAZINE-DM) 6.25-15 MG/5ML syrup Take 5 mLs by mouth at bedtime as needed.   SYRINGE/NEEDLE, DISP, 1 ML 23G X 1" 1 ML MISC 1 Syringe by Does not apply route every 30 (thirty) days.   valsartan (DIOVAN) 320 MG tablet Take 1 tablet (320 mg total) by mouth daily. Needs appointment with PCP for refills   No facility-administered encounter medications on file as of 06/29/2022.    No Known Allergies  Review of Systems  Constitutional:  Positive for activity change and appetite change. Negative for chills, diaphoresis, fatigue, fever, unexpected weight change and weight loss.  HENT:  Positive for congestion. Negative for dental problem, drooling, ear discharge, ear pain, facial swelling, hearing loss, mouth sores, nosebleeds, postnasal drip, rhinorrhea, sinus pressure, sinus pain, sneezing, sore throat, tinnitus, trouble swallowing and voice change.   Respiratory:  Positive for cough, shortness of breath and wheezing.  Negative for apnea, hemoptysis, choking, chest tightness and stridor.   Cardiovascular:  Negative for chest pain.  Gastrointestinal:  Negative for abdominal pain and heartburn.  Genitourinary:  Negative for decreased urine volume and difficulty urinating.  Musculoskeletal:  Negative for arthralgias and myalgias.  Skin:  Negative for rash.  Neurological:  Negative for weakness and headaches.  Psychiatric/Behavioral:  Negative for confusion.   All other systems reviewed and are negative.        Observations/Objective: No vital signs or physical exam, this was a virtual health encounter.  Pt alert and oriented, answers all questions appropriately, and able to speak in full sentences.    Assessment and Plan: Jimmy Park was seen today for cough.  Diagnoses and all orders for this visit:  Cough in adult Wheezing URI with cough and congestion Ongoing symptoms despite treatment with amoxicillin and short course of steroids. Concerns for progression into CAP. Will add azithromycin to regimen along with regular use of Albuterol inhaler over the next few days. Prednisone as prescribed. Aware if symptoms get any worse, he needs to be seen in person or go to the ED for evaluation. Pt and wife agreed and states he has an upcoming appointment with PCP, aware to keep this.  -     azithromycin (ZITHROMAX Z-PAK) 250 MG tablet; As directed -     predniSONE (STERAPRED UNI-PAK 21 TAB) 10 MG (21) TBPK tablet; As directed x 6 days -     albuterol (VENTOLIN HFA) 108 (90 Base) MCG/ACT inhaler; Inhale 2 puffs into the lungs every 6 (six) hours as needed for wheezing or shortness of breath.     Follow Up Instructions: Return if symptoms worsen or fail to improve.    I discussed the assessment and treatment plan with the patient. The patient was provided an opportunity to ask questions and all were answered. The patient agreed with the plan and demonstrated an understanding of the instructions.   The patient  was advised to call back or seek an in-person evaluation if the symptoms worsen or if the condition fails to improve as anticipated.  The above assessment and management plan was discussed with the patient. The patient verbalized understanding of and has agreed to the management plan. Patient is aware to call the clinic if they develop any new symptoms or if symptoms persist or worsen. Patient is aware when to return to the clinic for a follow-up visit. Patient educated on when  it is appropriate to go to the emergency department.    I provided 15 minutes of time during this telephone encounter.   Monia Pouch, FNP-C [facility] [address] [place], [place] [address] [phone] 06/29/2022

## 2022-07-07 ENCOUNTER — Encounter: Payer: Self-pay | Admitting: Family Medicine

## 2022-07-07 ENCOUNTER — Ambulatory Visit (INDEPENDENT_AMBULATORY_CARE_PROVIDER_SITE_OTHER): Payer: Medicare HMO | Admitting: Family Medicine

## 2022-07-07 VITALS — BP 123/75 | HR 60 | Temp 97.3°F | Ht 67.0 in | Wt 177.0 lb

## 2022-07-07 DIAGNOSIS — R7309 Other abnormal glucose: Secondary | ICD-10-CM | POA: Diagnosis not present

## 2022-07-07 DIAGNOSIS — Q030 Malformations of aqueduct of Sylvius: Secondary | ICD-10-CM

## 2022-07-07 DIAGNOSIS — E538 Deficiency of other specified B group vitamins: Secondary | ICD-10-CM | POA: Diagnosis not present

## 2022-07-07 DIAGNOSIS — E782 Mixed hyperlipidemia: Secondary | ICD-10-CM | POA: Diagnosis not present

## 2022-07-07 DIAGNOSIS — R270 Ataxia, unspecified: Secondary | ICD-10-CM | POA: Diagnosis not present

## 2022-07-07 DIAGNOSIS — E559 Vitamin D deficiency, unspecified: Secondary | ICD-10-CM | POA: Insufficient documentation

## 2022-07-07 DIAGNOSIS — G912 (Idiopathic) normal pressure hydrocephalus: Secondary | ICD-10-CM | POA: Diagnosis not present

## 2022-07-07 DIAGNOSIS — J0191 Acute recurrent sinusitis, unspecified: Secondary | ICD-10-CM

## 2022-07-07 DIAGNOSIS — I10 Essential (primary) hypertension: Secondary | ICD-10-CM

## 2022-07-07 DIAGNOSIS — G9389 Other specified disorders of brain: Secondary | ICD-10-CM

## 2022-07-07 LAB — BAYER DCA HB A1C WAIVED: HB A1C (BAYER DCA - WAIVED): 9.9 % — ABNORMAL HIGH (ref 4.8–5.6)

## 2022-07-07 MED ORDER — MOXIFLOXACIN HCL 400 MG PO TABS: 400.0000 mg | ORAL_TABLET | Freq: Every day | ORAL | 0 refills | Status: DC

## 2022-07-07 MED ORDER — METFORMIN HCL ER 500 MG PO TB24: 500.0000 mg | ORAL_TABLET | Freq: Two times a day (BID) | ORAL | 2 refills | Status: DC

## 2022-07-07 MED ORDER — PREDNISONE 10 MG PO TABS: ORAL_TABLET | ORAL | 0 refills | Status: DC

## 2022-07-07 MED ORDER — VALSARTAN 320 MG PO TABS: 320.0000 mg | ORAL_TABLET | Freq: Every day | ORAL | 3 refills | Status: DC

## 2022-07-07 NOTE — Addendum Note (Signed)
Addended by: Claretta Fraise on: 07/07/2022 03:16 PM   Modules accepted: Orders

## 2022-07-07 NOTE — Progress Notes (Addendum)
Subjective:  Patient ID: Jimmy Park, male    DOB: [birth year]  Age: 72 y.o. MRN: [id number]  CC: Medical Management of Chronic Issues   HPI [name] presents for falling a few times. Has NPH. Not a stent candidate. [name] to [employer].) Does okay with the cane if he takes it slow. Sometimes he gets going to fast and left foot      07/07/2022    8:38 AM 07/07/2022    8:29 AM 06/10/2022   10:41 AM  Depression screen PHQ 2/9  Decreased Interest 1 0 1  Down, Depressed, Hopeless 0 0 0  PHQ - 2 Score 1 0 1  Altered sleeping 0  0  Tired, decreased energy 1  2  Change in appetite 0  0  Feeling bad or failure about yourself  0  0  Trouble concentrating 0  0  Moving slowly or fidgety/restless 0  0  Suicidal thoughts 0  0  PHQ-9 Score 2  3  Difficult doing work/chores Somewhat difficult  Not difficult at all    History Jimmy Park has a past medical history of Allergy, Anemia, Asthma, B12 deficiency, Cerebral ventriculomegaly, Gait abnormality, Hypertension, Pneumonia of left lung due to infectious organism (08/21/2020), and URI with cough and congestion (08/21/2020).   He has a past surgical history that includes Lumbar drain implantation.   His family history includes Asthma in his brother; Diabetes in his father and mother; Hypertension in his father and mother.He reports that he quit smoking about 49 years ago. His smoking use included cigarettes. He started smoking about 55 years ago. He smoked an average of .5 packs per day. He has never used smokeless tobacco. He reports that he does not currently use alcohol. He reports that he does not use drugs.    ROS Review of Systems  Objective:  BP 123/75   Pulse 60   Temp (!) 97.3 F (36.3 C)   Ht '5\' 7"'$  (1.702 m)   Wt 177 lb (80.3 kg)   SpO2 96%   BMI 27.72 kg/m   BP Readings from Last 3 Encounters:  07/07/22 123/75  05/14/22 128/70  04/10/21 136/75    Wt Readings from Last 3 Encounters:  07/07/22 177 lb (80.3 kg)  06/10/22 183 lb  (83 kg)  05/14/22 183 lb 2 oz (83.1 kg)     Physical Exam Vitals reviewed.  Constitutional:      Appearance: He is well-developed.  HENT:     Head: Normocephalic and atraumatic.     Right Ear: External ear normal.     Left Ear: External ear normal.     Nose: Congestion present.     Mouth/Throat:     Pharynx: No oropharyngeal exudate or posterior oropharyngeal erythema.  Eyes:     Pupils: Pupils are equal, round, and reactive to light.  Cardiovascular:     Rate and Rhythm: Normal rate and regular rhythm.     Heart sounds: No murmur heard. Pulmonary:     Effort: No respiratory distress.     Breath sounds: Rhonchi present. No rales.  Musculoskeletal:     Cervical back: Normal range of motion and neck supple.  Neurological:     Mental Status: He is alert and oriented to person, place, and time.       Assessment & Plan:   Kaidin was seen today for medical management of chronic issues.  Diagnoses and all orders for this visit:  Benign essential HTN -     CBC  with Differential/Platelet -     CMP14+EGFR -     valsartan (DIOVAN) 320 MG tablet; Take 1 tablet (320 mg total) by mouth daily.  Vitamin B12 deficiency -     Vitamin B12  Vitamin D deficiency -     VITAMIN D 25 Hydroxy (Vit-D Deficiency, Fractures)  Mixed hyperlipidemia -     Lipid panel  Cerebral ventriculomegaly  Aqueductal stenosis (HCC)  Normal pressure hydrocephalus (HCC)  Ataxia -     Ambulatory referral to Physical Therapy  Elevated glucose -     Bayer DCA Hb A1c Waived  Acute recurrent sinusitis, unspecified location  Other orders -     moxifloxacin (AVELOX) 400 MG tablet; Take 1 tablet (400 mg total) by mouth daily. -     predniSONE (DELTASONE) 10 MG tablet; Take 5 daily for 3 days followed by 4,3,2 and 1 for 3 days each. -     metFORMIN (GLUCOPHAGE-XR) 500 MG 24 hr tablet; Take 1 tablet (500 mg total) by mouth 2 (two) times daily with a meal.       I have discontinued Danyl Gravelle's  benzonatate, azithromycin, and predniSONE. I have also changed his valsartan. Additionally, I am having him start on moxifloxacin, predniSONE, and metFORMIN. Lastly, I am having him maintain his aspirin, folic acid, ibuprofen, Fish Oil, albuterol, SYRINGE/NEEDLE (DISP) 1 ML, ketoconazole, DentaGel, Advair Diskus, fluticasone, promethazine-dextromethorphan, cetirizine, omeprazole, cyanocobalamin, budesonide, metoprolol tartrate, montelukast, and albuterol.  Allergies as of 07/07/2022   No Known Allergies      Medication List        Accurate as of July 07, 2022  3:15 PM. If you have any questions, ask your nurse or doctor.          STOP taking these medications    azithromycin 250 MG tablet Commonly known as: Zithromax Z-Pak Stopped by: Claretta Fraise, MD   benzonatate 200 MG capsule Commonly known as: TESSALON Stopped by: Claretta Fraise, MD   predniSONE 10 MG (21) Tbpk tablet Commonly known as: STERAPRED UNI-PAK 21 TAB Replaced by: predniSONE 10 MG tablet Stopped by: Claretta Fraise, MD       TAKE these medications    Advair Diskus 250-50 MCG/ACT Aepb Generic drug: fluticasone-salmeterol TAKE 1 PUFF BY MOUTH TWICE A DAY   albuterol 108 (90 Base) MCG/ACT inhaler Commonly known as: VENTOLIN HFA TAKE 2 PUFFS BY MOUTH EVERY 6 HOURS AS NEEDED FOR WHEEZE OR SHORTNESS OF BREATH   albuterol 108 (90 Base) MCG/ACT inhaler Commonly known as: VENTOLIN HFA Inhale 2 puffs into the lungs every 6 (six) hours as needed for wheezing or shortness of breath.   aspirin 81 MG tablet Take 1 tablet by mouth daily.   budesonide 0.5 MG/2ML nebulizer solution Commonly known as: PULMICORT Take 2 mLs (0.5 mg total) by nebulization 2 (two) times daily as needed.   cetirizine 10 MG tablet Commonly known as: ZYRTEC TAKE 1 TABLET BY MOUTH EVERY DAY   cyanocobalamin 1000 MCG/ML injection Commonly known as: VITAMIN B12 INJECT 1 ML INTO THE SKIN EVERY 30 (THIRTY) DAYS.   DentaGel 1.1 % Gel  dental gel Generic drug: sodium fluoride SMARTSIG:Application To Teeth   Fish Oil 1000 MG Caps Take by mouth.   fluticasone 50 MCG/ACT nasal spray Commonly known as: FLONASE SPRAY 2 SPRAYS INTO EACH NOSTRIL EVERY DAY   folic acid 1 MG tablet Commonly known as: FOLVITE Take 1 tablet by mouth daily.   ibuprofen 400 MG tablet Commonly known as: ADVIL Take 1 tablet by  mouth 2 (two) times daily as needed.   ketoconazole 2 % shampoo Commonly known as: NIZORAL Apply topically.   metFORMIN 500 MG 24 hr tablet Commonly known as: GLUCOPHAGE-XR Take 1 tablet (500 mg total) by mouth 2 (two) times daily with a meal. Started by: Claretta Fraise, MD   metoprolol tartrate 25 MG tablet Commonly known as: LOPRESSOR TAKE 1 TABLET (25 MG TOTAL) BY MOUTH DAILY.   montelukast 10 MG tablet Commonly known as: SINGULAIR TAKE 1 TABLET BY MOUTH EVERY DAY   moxifloxacin 400 MG tablet Commonly known as: AVELOX Take 1 tablet (400 mg total) by mouth daily. Started by: Claretta Fraise, MD   omeprazole 40 MG capsule Commonly known as: PRILOSEC TAKE 1 CAPSULE BY MOUTH EVERY DAY   predniSONE 10 MG tablet Commonly known as: DELTASONE Take 5 daily for 3 days followed by 4,3,2 and 1 for 3 days each. Replaces: predniSONE 10 MG (21) Tbpk tablet Started by: Claretta Fraise, MD   promethazine-dextromethorphan 6.25-15 MG/5ML syrup Commonly known as: PROMETHAZINE-DM Take 5 mLs by mouth at bedtime as needed.   SYRINGE/NEEDLE (DISP) 1 ML 23G X 1" 1 ML Misc 1 Syringe by Does not apply route every 30 (thirty) days.   valsartan 320 MG tablet Commonly known as: DIOVAN Take 1 tablet (320 mg total) by mouth daily. What changed: additional instructions Changed by: Claretta Fraise, MD       A1c =9.9. Metformin Added.   Follow-up: Return in about 1 month (around 08/06/2022) for diabetes.  Claretta Fraise, M.D.

## 2022-07-08 LAB — CBC WITH DIFFERENTIAL/PLATELET
Basophils Absolute: 0.1 10*3/uL (ref 0.0–0.2)
Basos: 1 %
EOS (ABSOLUTE): 0.3 10*3/uL (ref 0.0–0.4)
Eos: 5 %
Hematocrit: 48.3 % (ref 37.5–51.0)
Hemoglobin: 16.4 g/dL (ref 13.0–17.7)
Immature Grans (Abs): 0 10*3/uL (ref 0.0–0.1)
Immature Granulocytes: 1 %
Lymphocytes Absolute: 1.9 10*3/uL (ref 0.7–3.1)
Lymphs: 31 %
MCH: 31.7 pg (ref 26.6–33.0)
MCHC: 34 g/dL (ref 31.5–35.7)
MCV: 93 fL (ref 79–97)
Monocytes Absolute: 0.4 10*3/uL (ref 0.1–0.9)
Monocytes: 7 %
Neutrophils Absolute: 3.6 10*3/uL (ref 1.4–7.0)
Neutrophils: 55 %
Platelets: 201 10*3/uL (ref 150–450)
RBC: 5.17 x10E6/uL (ref 4.14–5.80)
RDW: 11.9 % (ref 11.6–15.4)
WBC: 6.4 10*3/uL (ref 3.4–10.8)

## 2022-07-08 LAB — CMP14+EGFR
ALT: 122 IU/L — ABNORMAL HIGH (ref 0–44)
AST: 52 IU/L — ABNORMAL HIGH (ref 0–40)
Albumin/Globulin Ratio: 2.1 (ref 1.2–2.2)
Albumin: 4.6 g/dL (ref 3.8–4.8)
Alkaline Phosphatase: 129 IU/L — ABNORMAL HIGH (ref 44–121)
BUN/Creatinine Ratio: 21 (ref 10–24)
BUN: 19 mg/dL (ref 8–27)
Bilirubin Total: 0.8 mg/dL (ref 0.0–1.2)
CO2: 22 mmol/L (ref 20–29)
Calcium: 9.4 mg/dL (ref 8.6–10.2)
Chloride: 97 mmol/L (ref 96–106)
Creatinine, Ser: 0.92 mg/dL (ref 0.76–1.27)
Globulin, Total: 2.2 g/dL (ref 1.5–4.5)
Glucose: 206 mg/dL — ABNORMAL HIGH (ref 70–99)
Potassium: 4.4 mmol/L (ref 3.5–5.2)
Sodium: 138 mmol/L (ref 134–144)
Total Protein: 6.8 g/dL (ref 6.0–8.5)
eGFR: 88 mL/min/{1.73_m2} (ref >59)

## 2022-07-08 LAB — LIPID PANEL
Chol/HDL Ratio: 4 ratio (ref 0.0–5.0)
Cholesterol, Total: 185 mg/dL (ref 100–199)
HDL: 46 mg/dL (ref >39)
LDL Chol Calc (NIH): 112 mg/dL — ABNORMAL HIGH (ref 0–99)
Triglycerides: 151 mg/dL — ABNORMAL HIGH (ref 0–149)
VLDL Cholesterol Cal: 27 mg/dL (ref 5–40)

## 2022-07-08 LAB — VITAMIN B12: Vitamin B-12: 1129 pg/mL (ref 232–1245)

## 2022-07-08 LAB — VITAMIN D 25 HYDROXY (VIT D DEFICIENCY, FRACTURES): Vit D, 25-Hydroxy: 62.9 ng/mL (ref 30.0–100.0)

## 2022-07-10 ENCOUNTER — Other Ambulatory Visit: Payer: Self-pay

## 2022-07-10 ENCOUNTER — Telehealth: Payer: Self-pay | Admitting: Family Medicine

## 2022-07-12 ENCOUNTER — Other Ambulatory Visit: Payer: Self-pay | Admitting: Family Medicine

## 2022-07-12 MED ORDER — BLOOD GLUCOSE TEST VI STRP: 1.0000 | ORAL_STRIP | Freq: Three times a day (TID) | 0 refills | Status: DC

## 2022-07-12 MED ORDER — BLOOD GLUCOSE MONITORING SUPPL DEVI: 1.0000 | Freq: Three times a day (TID) | 0 refills | Status: DC

## 2022-07-12 MED ORDER — LANCET DEVICE MISC: 1.0000 | Freq: Three times a day (TID) | 0 refills | Status: AC

## 2022-07-12 MED ORDER — LANCETS MISC. MISC: 1.0000 | Freq: Three times a day (TID) | 0 refills | Status: AC

## 2022-07-12 NOTE — Telephone Encounter (Signed)
Please let the patient know that I sent their prescription to their pharmacy. Thanks, WS 

## 2022-07-13 ENCOUNTER — Other Ambulatory Visit: Payer: Self-pay | Admitting: Family Medicine

## 2022-07-13 DIAGNOSIS — R7401 Elevation of levels of liver transaminase levels: Secondary | ICD-10-CM

## 2022-07-21 ENCOUNTER — Ambulatory Visit (HOSPITAL_COMMUNITY)
Admission: RE | Admit: 2022-07-21 | Discharge: 2022-07-21 | Disposition: A | Discharge status: Home / Self Care | Payer: Medicare HMO | Source: Ambulatory Visit | Attending: Family Medicine | Admitting: Family Medicine

## 2022-07-21 DIAGNOSIS — R7401 Elevation of levels of liver transaminase levels: Secondary | ICD-10-CM | POA: Diagnosis not present

## 2022-07-21 DIAGNOSIS — N281 Cyst of kidney, acquired: Secondary | ICD-10-CM | POA: Diagnosis not present

## 2022-08-04 ENCOUNTER — Ambulatory Visit (INDEPENDENT_AMBULATORY_CARE_PROVIDER_SITE_OTHER): Payer: Medicare HMO | Admitting: Family Medicine

## 2022-08-04 ENCOUNTER — Encounter: Payer: Self-pay | Admitting: Family Medicine

## 2022-08-04 VITALS — BP 127/78 | HR 68 | Temp 97.7°F | Ht 67.0 in | Wt 178.6 lb

## 2022-08-04 DIAGNOSIS — G912 (Idiopathic) normal pressure hydrocephalus: Secondary | ICD-10-CM

## 2022-08-04 DIAGNOSIS — R269 Unspecified abnormalities of gait and mobility: Secondary | ICD-10-CM | POA: Diagnosis not present

## 2022-08-04 DIAGNOSIS — E118 Type 2 diabetes mellitus with unspecified complications: Secondary | ICD-10-CM | POA: Diagnosis not present

## 2022-08-04 MED ORDER — METFORMIN HCL ER 750 MG PO TB24: 750.0000 mg | ORAL_TABLET | Freq: Two times a day (BID) | ORAL | 2 refills | Status: DC

## 2022-08-04 NOTE — Progress Notes (Signed)
Subjective:  Patient ID: Jimmy Park, male    DOB: 1950-12-22  Age: 72 y.o. MRN: [id number]  CC: Medical Management of Chronic Issues   HPI Jimmy Park presents forFollow-up of diabetes. Patient checks blood sugar at home.   130 -150 fasting and not checking postprandial Patient denies symptoms such as polyuria, polydipsia, excessive hunger, nausea No significant hypoglycemic spells noted. Medications reviewed. Pt reports taking them regularly without complication/adverse reaction being reported today.    History Jimmy Park has a past medical history of Allergy, Anemia, Asthma, B12 deficiency, Cerebral ventriculomegaly, Gait abnormality, Hypertension, Pneumonia of left lung due to infectious organism (08/21/2020), and URI with cough and congestion (08/21/2020).   He has a past surgical history that includes Lumbar drain implantation.   His family history includes Asthma in his brother; Diabetes in his father and mother; Hypertension in his father and mother.He reports that he quit smoking about 49 years ago. His smoking use included cigarettes. He started smoking about 55 years ago. He smoked an average of .5 packs per day. He has never used smokeless tobacco. He reports that he does not currently use alcohol. He reports that he does not use drugs.  Current Outpatient Medications on File Prior to Visit  Medication Sig Dispense Refill   ADVAIR DISKUS 250-50 MCG/ACT AEPB TAKE 1 PUFF BY MOUTH TWICE A DAY 60 each 2   albuterol (VENTOLIN HFA) 108 (90 Base) MCG/ACT inhaler TAKE 2 PUFFS BY MOUTH EVERY 6 HOURS AS NEEDED FOR WHEEZE OR SHORTNESS OF BREATH 18 each 0   albuterol (VENTOLIN HFA) 108 (90 Base) MCG/ACT inhaler Inhale 2 puffs into the lungs every 6 (six) hours as needed for wheezing or shortness of breath. 8 g 2   aspirin 81 MG tablet Take 1 tablet by mouth daily.     Blood Glucose Monitoring Suppl DEVI 1 each by Does not apply route in the morning, at noon, and at bedtime. May substitute to  any manufacturer covered by patient's insurance. 1 each 0   budesonide (PULMICORT) 0.5 MG/2ML nebulizer solution Take 2 mLs (0.5 mg total) by nebulization 2 (two) times daily as needed. 360 mL 0   cetirizine (ZYRTEC) 10 MG tablet TAKE 1 TABLET BY MOUTH EVERY DAY 90 tablet 1   cyanocobalamin (VITAMIN B12) 1000 MCG/ML injection INJECT 1 ML INTO THE SKIN EVERY 30 (THIRTY) DAYS. 3 mL 0   DENTAGEL 1.1 % GEL dental gel SMARTSIG:Application To Teeth     fluticasone (FLONASE) 50 MCG/ACT nasal spray SPRAY 2 SPRAYS INTO EACH NOSTRIL EVERY DAY 48 mL 1   folic acid (FOLVITE) 1 MG tablet Take 1 tablet by mouth daily.     Glucose Blood (BLOOD GLUCOSE TEST STRIPS) STRP 1 each by In Vitro route in the morning, at noon, and at bedtime. May substitute to any manufacturer covered by patient's insurance. 100 strip 0   ibuprofen (ADVIL,MOTRIN) 400 MG tablet Take 1 tablet by mouth 2 (two) times daily as needed.     ketoconazole (NIZORAL) 2 % shampoo Apply topically.     Lancet Device MISC 1 each by Does not apply route in the morning, at noon, and at bedtime. May substitute to any manufacturer covered by patient's insurance. 1 each 0   Lancets Misc. MISC 1 each by Does not apply route in the morning, at noon, and at bedtime. May substitute to any manufacturer covered by patient's insurance. 100 each 0   metoprolol tartrate (LOPRESSOR) 25 MG tablet TAKE 1 TABLET (25 MG TOTAL) BY  MOUTH DAILY. 90 tablet 0   montelukast (SINGULAIR) 10 MG tablet TAKE 1 TABLET BY MOUTH EVERY DAY 90 tablet 0   moxifloxacin (AVELOX) 400 MG tablet Take 1 tablet (400 mg total) by mouth daily. 10 tablet 0   Omega-3 Fatty Acids (FISH OIL) 1000 MG CAPS Take by mouth.     omeprazole (PRILOSEC) 40 MG capsule TAKE 1 CAPSULE BY MOUTH EVERY DAY 90 capsule 2   predniSONE (DELTASONE) 10 MG tablet Take 5 daily for 3 days followed by 4,3,2 and 1 for 3 days each. 45 tablet 0   promethazine-dextromethorphan (PROMETHAZINE-DM) 6.25-15 MG/5ML syrup Take 5 mLs by  mouth at bedtime as needed. 118 mL 0   SYRINGE/NEEDLE, DISP, 1 ML 23G X 1" 1 ML MISC 1 Syringe by Does not apply route every 30 (thirty) days. 6 each 1   valsartan (DIOVAN) 320 MG tablet Take 1 tablet (320 mg total) by mouth daily. 90 tablet 3   No current facility-administered medications on file prior to visit.    ROS Review of Systems  Constitutional:  Negative for fever.  Respiratory:  Negative for shortness of breath.   Cardiovascular:  Negative for chest pain.  Musculoskeletal:  Negative for arthralgias.  Skin:  Negative for rash.  Neurological:  Positive for weakness (drags left foot due to NPH).    Objective:  BP 127/78   Pulse 68   Temp 97.7 F (36.5 C)   Ht '5\' 7"'$  (1.702 m)   Wt 178 lb 9.6 oz (81 kg)   SpO2 95%   BMI 27.97 kg/m   BP Readings from Last 3 Encounters:  08/04/22 127/78  07/07/22 123/75  05/14/22 128/70    Wt Readings from Last 3 Encounters:  08/04/22 178 lb 9.6 oz (81 kg)  07/07/22 177 lb (80.3 kg)  06/10/22 183 lb (83 kg)     Physical Exam Vitals reviewed.  Constitutional:      Appearance: He is well-developed.  HENT:     Head: Normocephalic and atraumatic.     Right Ear: External ear normal.     Left Ear: External ear normal.     Mouth/Throat:     Pharynx: No oropharyngeal exudate or posterior oropharyngeal erythema.  Eyes:     Pupils: Pupils are equal, round, and reactive to light.  Cardiovascular:     Rate and Rhythm: Normal rate and regular rhythm.     Heart sounds: No murmur heard. Pulmonary:     Effort: No respiratory distress.     Breath sounds: Normal breath sounds.  Musculoskeletal:     Cervical back: Normal range of motion and neck supple.  Neurological:     Mental Status: He is alert and oriented to person, place, and time.     Coordination: Coordination normal.     Gait: Gait abnormal (dragging left foot).       Assessment & Plan:   Jimmy Park was seen today for medical management of chronic issues.  Diagnoses and  all orders for this visit:  Controlled type 2 diabetes mellitus with complication, without long-term current use of insulin (HCC)  Abnormal gait Comments: left foot drags  Normal pressure hydrocephalus (HCC)  Other orders -     metFORMIN (GLUCOPHAGE-XR) 750 MG 24 hr tablet; Take 1 tablet (750 mg total) by mouth 2 (two) times daily with a meal.      I have changed Jimmy Park's metFORMIN. I am also having him maintain his aspirin, folic acid, ibuprofen, Fish Oil, albuterol, SYRINGE/NEEDLE (DISP)  1 ML, ketoconazole, DentaGel, Advair Diskus, fluticasone, promethazine-dextromethorphan, cetirizine, omeprazole, cyanocobalamin, budesonide, metoprolol tartrate, montelukast, albuterol, valsartan, moxifloxacin, predniSONE, Blood Glucose Monitoring Suppl, BLOOD GLUCOSE TEST STRIPS, Lancet Device, and Lancets Misc..  Meds ordered this encounter  Medications   metFORMIN (GLUCOPHAGE-XR) 750 MG 24 hr tablet    Sig: Take 1 tablet (750 mg total) by mouth 2 (two) times daily with a meal.    Dispense:  60 tablet    Refill:  2    DC metformin 500 refills     Follow-up: Return in about 2 months (around 10/03/2022) for diabetes.  Claretta Fraise, M.D.

## 2022-08-12 ENCOUNTER — Other Ambulatory Visit: Payer: Self-pay | Admitting: Family Medicine

## 2022-08-12 DIAGNOSIS — J189 Pneumonia, unspecified organism: Secondary | ICD-10-CM

## 2022-08-17 ENCOUNTER — Other Ambulatory Visit: Payer: Self-pay | Admitting: Family Medicine

## 2022-08-21 ENCOUNTER — Other Ambulatory Visit: Payer: Self-pay | Admitting: Family Medicine

## 2022-08-31 ENCOUNTER — Telehealth (INDEPENDENT_AMBULATORY_CARE_PROVIDER_SITE_OTHER): Payer: Medicare HMO | Admitting: Family Medicine

## 2022-08-31 ENCOUNTER — Encounter: Payer: Self-pay | Admitting: Family Medicine

## 2022-08-31 DIAGNOSIS — J4520 Mild intermittent asthma, uncomplicated: Secondary | ICD-10-CM | POA: Diagnosis not present

## 2022-08-31 DIAGNOSIS — J40 Bronchitis, not specified as acute or chronic: Secondary | ICD-10-CM

## 2022-08-31 DIAGNOSIS — J329 Chronic sinusitis, unspecified: Secondary | ICD-10-CM | POA: Diagnosis not present

## 2022-08-31 MED ORDER — CHERATUSSIN AC 100-10 MG/5ML PO SOLN: 5.0000 mL | ORAL | 0 refills | Status: DC | PRN

## 2022-08-31 MED ORDER — MOXIFLOXACIN HCL 400 MG PO TABS: 400.0000 mg | ORAL_TABLET | Freq: Every day | ORAL | 0 refills | Status: DC

## 2022-08-31 MED ORDER — PREDNISONE 10 MG PO TABS: ORAL_TABLET | ORAL | 0 refills | Status: DC

## 2022-08-31 NOTE — Progress Notes (Signed)
Subjective:    Patient ID: Jimmy Park, male    DOB: 06/03/1951, 72 y.o.   MRN: [id number]   HPI: Jimmy Park is a 72 y.o. male presenting for recurrent cough. Rattling. Ears hurt. DOE. Using breathing treatments without helping. Onset was last week.       08/04/2022    9:17 AM 08/04/2022    9:09 AM 07/07/2022    8:38 AM 07/07/2022    8:29 AM 06/10/2022   10:41 AM  Depression screen PHQ 2/9  Decreased Interest 0 0 1 0 1  Down, Depressed, Hopeless 0 0 0 0 0  PHQ - 2 Score 0 0 1 0 1  Altered sleeping 1  0  0  Tired, decreased energy 1  1  2   Change in appetite 0  0  0  Feeling bad or failure about yourself  0  0  0  Trouble concentrating 0  0  0  Moving slowly or fidgety/restless 0  0  0  Suicidal thoughts 0  0  0  PHQ-9 Score 2  2  3   Difficult doing work/chores Somewhat difficult  Somewhat difficult  Not difficult at all     Relevant past medical, surgical, family and social history reviewed and updated as indicated.  Interim medical history since our last visit reviewed. Allergies and medications reviewed and updated.  ROS:  Review of Systems   Social History   Tobacco Use  Smoking Status Former   Packs/day: .5   Types: Cigarettes   Start date: 06/09/1967   Quit date: 06/08/1973   Years since quitting: 49.2  Smokeless Tobacco Never       Objective:     Wt Readings from Last 3 Encounters:  08/04/22 178 lb 9.6 oz (81 kg)  07/07/22 177 lb (80.3 kg)  06/10/22 183 lb (83 kg)     Video exam performed - no acute distress while at rest. Looks somewhat pale. Alert & Oriented. I could hear rhonchi through the connection  Assessment & Plan:   1. Intermittent asthma, unspecified asthma severity, unspecified whether complicated   2. Sinobronchitis     Meds ordered this encounter  Medications   moxifloxacin (AVELOX) 400 MG tablet    Sig: Take 1 tablet (400 mg total) by mouth daily.    Dispense:  10 tablet    Refill:  0   predniSONE (DELTASONE) 10 MG tablet     Sig: Take 5 daily for 3 days followed by 4,3,2 and 1 for 3 days each.    Dispense:  45 tablet    Refill:  0   guaiFENesin-codeine (CHERATUSSIN AC) 100-10 MG/5ML syrup    Sig: Take 5 mLs by mouth every 4 (four) hours as needed for cough.    Dispense:  180 mL    Refill:  0    No orders of the defined types were placed in this encounter.     Diagnoses and all orders for this visit:  Intermittent asthma, unspecified asthma severity, unspecified whether complicated  Sinobronchitis  Other orders -     moxifloxacin (AVELOX) 400 MG tablet; Take 1 tablet (400 mg total) by mouth daily. -     predniSONE (DELTASONE) 10 MG tablet; Take 5 daily for 3 days followed by 4,3,2 and 1 for 3 days each. -     guaiFENesin-codeine (CHERATUSSIN AC) 100-10 MG/5ML syrup; Take 5 mLs by mouth every 4 (four) hours as needed for cough.    Virtual Visit  Note  I discussed the limitations, risks, security and privacy concerns of performing an evaluation and management service by  video and the availability of in person appointments. The patient was identified with two identifiers. Pt.expressed understanding and agreed to proceed. Pt. Is at home. Dr. Livia Snellen is in his office.  Follow Up Instructions:   I discussed the assessment and treatment plan with the patient. The patient was provided an opportunity to ask questions and all were answered. The patient agreed with the plan and demonstrated an understanding of the instructions.   The patient was advised to call back or seek an in-person evaluation if the symptoms worsen or if the condition fails to improve as anticipated.   Total minutes including chart review and phone contact time: 13   Follow up plan: Return in about 6 weeks (around 10/12/2022), or if symptoms worsen or fail to improve.  Claretta Fraise, MD [place]

## 2022-09-08 ENCOUNTER — Other Ambulatory Visit: Payer: Self-pay | Admitting: Family Medicine

## 2022-09-08 DIAGNOSIS — E538 Deficiency of other specified B group vitamins: Secondary | ICD-10-CM

## 2022-09-15 ENCOUNTER — Telehealth: Payer: Self-pay | Admitting: Family Medicine

## 2022-09-15 ENCOUNTER — Other Ambulatory Visit: Payer: Self-pay | Admitting: Family Medicine

## 2022-09-15 DIAGNOSIS — J4520 Mild intermittent asthma, uncomplicated: Secondary | ICD-10-CM

## 2022-09-15 MED ORDER — PREDNISONE 10 MG PO TABS: ORAL_TABLET | ORAL | 0 refills | Status: DC

## 2022-09-15 MED ORDER — FLUTICASONE-SALMETEROL 250-50 MCG/ACT IN AEPB: INHALATION_SPRAY | RESPIRATORY_TRACT | 2 refills | Status: AC

## 2022-09-15 MED ORDER — MOXIFLOXACIN HCL 400 MG PO TABS: 400.0000 mg | ORAL_TABLET | Freq: Every day | ORAL | 0 refills | Status: DC

## 2022-09-15 NOTE — Telephone Encounter (Signed)
  Incoming Patient Call  09/15/2022  What symptoms do you have? Wheezing and rattling in chest coughing   How long have you been sick? About two weeks  Have you been seen for this problem? Yes video visit 08/31/22, has finished taking meds prescribed and not any better wheezing is worse  If your provider decides to give you a prescription, which pharmacy would you like for it to be sent to? [facility]   Patient informed that this information will be sent to the clinical staff for review and that they should receive a follow up call.

## 2022-09-15 NOTE — Telephone Encounter (Signed)
CALLED WIFE, NO ANSWER, LEFT MESSAGE TO RETURN CALL ?

## 2022-09-15 NOTE — Telephone Encounter (Signed)
I sent a refill of his meds for this to [facility]

## 2022-09-22 ENCOUNTER — Telehealth: Payer: Self-pay | Admitting: Family Medicine

## 2022-09-22 NOTE — Telephone Encounter (Signed)
Please schedule follow up for re-eval.

## 2022-09-22 NOTE — Telephone Encounter (Signed)
  Incoming Patient Call  09/22/2022  What symptoms do you have? Wheezing and coughing  How long have you been sick? 3 weeks  Have you been seen for this problem? Yes he has been taking medications that were prescribed has a few more days of prednisone and two more pills of antibiotic but still not better rattling and wheezing   If your provider decides to give you a prescription, which pharmacy would you like for it to be sent to?[facility]   Patient informed that this information will be sent to the clinical staff for review and that they should receive a follow up call.

## 2022-09-23 ENCOUNTER — Encounter: Payer: Self-pay | Admitting: Family Medicine

## 2022-09-23 ENCOUNTER — Telehealth (INDEPENDENT_AMBULATORY_CARE_PROVIDER_SITE_OTHER): Payer: Medicare HMO | Admitting: Family Medicine

## 2022-09-23 ENCOUNTER — Other Ambulatory Visit: Payer: Self-pay | Admitting: *Deleted

## 2022-09-23 ENCOUNTER — Other Ambulatory Visit: Payer: Self-pay | Admitting: Family Medicine

## 2022-09-23 DIAGNOSIS — R0602 Shortness of breath: Secondary | ICD-10-CM

## 2022-09-23 DIAGNOSIS — J4520 Mild intermittent asthma, uncomplicated: Secondary | ICD-10-CM

## 2022-09-23 DIAGNOSIS — R062 Wheezing: Secondary | ICD-10-CM

## 2022-09-23 DIAGNOSIS — I10 Essential (primary) hypertension: Secondary | ICD-10-CM

## 2022-09-23 NOTE — Progress Notes (Signed)
    Subjective:    Patient ID: Jimmy Park, male    DOB: 03-29-1951, 72 y.o.   MRN: [id number]   HPI: Jimmy Park is a 72 y.o. male presenting for chest rattling. Med not helping. Getting worse. No fever. Feels short of breath. Finished antibiotic.      Relevant past medical, surgical, family and social history reviewed and updated as indicated.  Interim medical history since our last visit reviewed. Allergies and medications reviewed and updated.  ROS:  Review of Systems  Constitutional:  Positive for activity change (decreased), fatigue and fever.  HENT:  Positive for congestion.   Respiratory:  Positive for cough and shortness of breath.   Neurological:  Positive for headaches.     Social History   Tobacco Use  Smoking Status Former   Packs/day: .5   Types: Cigarettes   Start date: 06/09/1967   Quit date: 06/08/1973   Years since quitting: 49.3  Smokeless Tobacco Never       Objective:     Wt Readings from Last 3 Encounters:  08/04/22 178 lb 9.6 oz (81 kg)  07/07/22 177 lb (80.3 kg)  06/10/22 183 lb (83 kg)     Video exam - pt. Alert, NAD. Normocephalic. Nml coloration of face.   Assessment & Plan:   1. Shortness of breath     Video visit was ended and pt. Asked to come to office for evaluation based on severity of symptoms.  No orders of the defined types were placed in this encounter.     Diagnoses and all orders for this visit:  Shortness of breath    Virtual Visit via telephone Note  I discussed the limitations, risks, security and privacy concerns of performing an evaluation and management service by telephone and the availability of in person appointments. The patient was identified with two identifiers. Pt.expressed understanding and agreed to proceed. Pt. Is at home. Dr. Darlyn Read is in his office.  Follow Up Instructions:   I discussed the assessment and treatment plan with the patient. The patient was provided an opportunity to ask  questions and all were answered. The patient agreed with the plan and demonstrated an understanding of the instructions.   The patient was advised to call back or seek an in-person evaluation if the symptoms worsen or if the condition fails to improve as anticipated.   Total minutes including chart review and phone contact time: 10   Follow up plan: Return in 1 day (on 09/24/2022) for dyspneA.  Mechele Claude, MD [facility]

## 2022-09-24 ENCOUNTER — Ambulatory Visit (INDEPENDENT_AMBULATORY_CARE_PROVIDER_SITE_OTHER): Payer: Medicare HMO

## 2022-09-24 ENCOUNTER — Encounter: Payer: Self-pay | Admitting: Family Medicine

## 2022-09-24 ENCOUNTER — Ambulatory Visit (INDEPENDENT_AMBULATORY_CARE_PROVIDER_SITE_OTHER): Payer: Medicare HMO | Admitting: Family Medicine

## 2022-09-24 VITALS — BP 109/62 | HR 66 | Temp 97.7°F | Ht 67.0 in | Wt 177.6 lb

## 2022-09-24 DIAGNOSIS — R062 Wheezing: Secondary | ICD-10-CM

## 2022-09-24 DIAGNOSIS — J340 Abscess, furuncle and carbuncle of nose: Secondary | ICD-10-CM | POA: Diagnosis not present

## 2022-09-24 DIAGNOSIS — R0602 Shortness of breath: Secondary | ICD-10-CM | POA: Diagnosis not present

## 2022-09-24 LAB — CBC WITH DIFFERENTIAL/PLATELET
Hematocrit: 45.7 % (ref 37.5–51.0)
Immature Grans (Abs): 0.1 10*3/uL (ref 0.0–0.1)
Lymphs: 14 %
MCH: 32.2 pg (ref 26.6–33.0)
Monocytes Absolute: 0.6 10*3/uL (ref 0.1–0.9)
RDW: 12.1 % (ref 11.6–15.4)

## 2022-09-24 LAB — CMP14+EGFR

## 2022-09-24 MED ORDER — BETAMETHASONE SOD PHOS & ACET 6 (3-3) MG/ML IJ SUSP
6.0000 mg | Freq: Once | INTRAMUSCULAR | Status: AC
Start: 2022-09-24 — End: 2022-09-24
  Administered 2022-09-24: 6 mg via INTRAMUSCULAR

## 2022-09-24 MED ORDER — FEXOFENADINE-PSEUDOEPHED ER 180-240 MG PO TB24
1.0000 | ORAL_TABLET | Freq: Every day | ORAL | 11 refills | Status: DC
Start: 2022-09-24 — End: 2023-12-13

## 2022-09-24 NOTE — Progress Notes (Signed)
Subjective:  Patient ID: Jimmy Park, male    DOB: 11/16/1950  Age: 72 y.o. MRN: [id number]  CC: Wheezing   HPI Jimmy Park presents for a lot of phlegm collecting in his throat. Won't come up.  Not much rhinorrhea. Having posterior nasal drainage. Scant production is yellow.  Dyspneic with activity such as walking 20 feet. Last antibiotic today. Has a little prednisone left. - down to 2 a day.       09/24/2022    1:26 PM 08/04/2022    9:17 AM 08/04/2022    9:09 AM  Depression screen PHQ 2/9  Decreased Interest 0 0 0  Down, Depressed, Hopeless 0 0 0  PHQ - 2 Score 0 0 0  Altered sleeping  1   Tired, decreased energy  1   Change in appetite  0   Feeling bad or failure about yourself   0   Trouble concentrating  0   Moving slowly or fidgety/restless  0   Suicidal thoughts  0   PHQ-9 Score  2   Difficult doing work/chores  Somewhat difficult     History Jimmy Park has a past medical history of Allergy, Anemia, Asthma, B12 deficiency, Cerebral ventriculomegaly, Gait abnormality, Hypertension, Pneumonia of left lung due to infectious organism (08/21/2020), and URI with cough and congestion (08/21/2020).   He has a past surgical history that includes Lumbar drain implantation.   His family history includes Asthma in his brother; Diabetes in his father and mother; Hypertension in his father and mother.He reports that he quit smoking about 49 years ago. His smoking use included cigarettes. He started smoking about 55 years ago. He smoked an average of .5 packs per day. He has never used smokeless tobacco. He reports that he does not currently use alcohol. He reports that he does not use drugs.    ROS Review of Systems  Constitutional:  Negative for fever.  Respiratory:  Negative for shortness of breath.   Cardiovascular:  Negative for chest pain.  Musculoskeletal:  Negative for arthralgias.  Skin:  Negative for rash.    Objective:  BP 109/62   Pulse 66   Temp 97.7 F (36.5 C)    Ht  (1.702 m)   Wt 177 lb 9.6 oz (80.6 kg)   SpO2 95%   BMI 27.82 kg/m   BP Readings from Last 3 Encounters:  09/24/22 109/62  08/04/22 127/78  07/07/22 123/75    Wt Readings from Last 3 Encounters:  09/24/22 177 lb 9.6 oz (80.6 kg)  08/04/22 178 lb 9.6 oz (81 kg)  07/07/22 177 lb (80.3 kg)     Physical Exam Vitals reviewed.  Constitutional:      Appearance: He is well-developed.  HENT:     Head: Normocephalic and atraumatic.     Right Ear: External ear normal.     Left Ear: External ear normal.     Nose:     Comments: Tender maxillary sinuses with swollen nasal passages    Mouth/Throat:     Pharynx: No oropharyngeal exudate or posterior oropharyngeal erythema.  Eyes:     Pupils: Pupils are equal, round, and reactive to light.  Cardiovascular:     Rate and Rhythm: Normal rate and regular rhythm.     Heart sounds: No murmur heard. Pulmonary:     Effort: No respiratory distress.     Breath sounds: Normal breath sounds.  Musculoskeletal:     Cervical back: Normal range of motion and neck supple.  Neurological:     Mental Status: He is alert and oriented to person, place, and time.    CXR- no infiltrate noted. Preliminary reading done by Farris Has    Assessment & Plan:   Jimmy Park was seen today for wheezing.  Diagnoses and all orders for this visit:  Abscess, furuncle and carbuncle of nose -     betamethasone acetate-betamethasone sodium phosphate (CELESTONE) injection 6 mg -     CBC with Differential/Platelet -     CMP14+EGFR -     CT Maxillofacial W/Cm; Future  Shortness of breath -     betamethasone acetate-betamethasone sodium phosphate (CELESTONE) injection 6 mg -     CBC with Differential/Platelet -     CMP14+EGFR  Other orders -     fexofenadine-pseudoephedrine (ALLEGRA-D 24) 180-240 MG 24 hr tablet; Take 1 tablet by mouth daily. For allergy and congestion       I have discontinued Dontario Laton's cetirizine. I am also having him  start on fexofenadine-pseudoephedrine. Additionally, I am having him maintain his aspirin, folic acid, ibuprofen, Fish Oil, albuterol, SYRINGE/NEEDLE (DISP) 1 ML, ketoconazole, DentaGel, fluticasone, omeprazole, albuterol, valsartan, metFORMIN, OneTouch Ultra, ONE TOUCH ULTRA 2, Cheratussin AC, cyanocobalamin, fluticasone-salmeterol, moxifloxacin, predniSONE, montelukast, and metoprolol tartrate. We administered betamethasone acetate-betamethasone sodium phosphate.  Allergies as of 09/24/2022   No Known Allergies      Medication List        Accurate as of September 24, 2022 11:59 PM. If you have any questions, ask your nurse or doctor.          STOP taking these medications    cetirizine 10 MG tablet Commonly known as: ZYRTEC Stopped by: Mechele Claude, MD       TAKE these medications    albuterol 108 (90 Base) MCG/ACT inhaler Commonly known as: VENTOLIN HFA TAKE 2 PUFFS BY MOUTH EVERY 6 HOURS AS NEEDED FOR WHEEZE OR SHORTNESS OF BREATH   albuterol 108 (90 Base) MCG/ACT inhaler Commonly known as: VENTOLIN HFA Inhale 2 puffs into the lungs every 6 (six) hours as needed for wheezing or shortness of breath.   aspirin 81 MG tablet Take 1 tablet by mouth daily.   Cheratussin AC 100-10 MG/5ML syrup Generic drug: guaiFENesin-codeine Take 5 mLs by mouth every 4 (four) hours as needed for cough.   cyanocobalamin 1000 MCG/ML injection Commonly known as: VITAMIN B12 INJECT 1 ML INTO THE SKIN EVERY 30 DAYS.   DentaGel 1.1 % Gel dental gel Generic drug: sodium fluoride SMARTSIG:Application To Teeth   fexofenadine-pseudoephedrine 180-240 MG 24 hr tablet Commonly known as: ALLEGRA-D 24 Take 1 tablet by mouth daily. For allergy and congestion Started by: Mechele Claude, MD   Fish Oil 1000 MG Caps Take by mouth.   fluticasone 50 MCG/ACT nasal spray Commonly known as: FLONASE SPRAY 2 SPRAYS INTO EACH NOSTRIL EVERY DAY   fluticasone-salmeterol 250-50 MCG/ACT Aepb Commonly known  as: Advair Diskus TAKE 1 PUFF BY MOUTH TWICE A DAY   folic acid 1 MG tablet Commonly known as: FOLVITE Take 1 tablet by mouth daily.   ibuprofen 400 MG tablet Commonly known as: ADVIL Take 1 tablet by mouth 2 (two) times daily as needed.   ketoconazole 2 % shampoo Commonly known as: NIZORAL Apply topically.   metFORMIN 750 MG 24 hr tablet Commonly known as: GLUCOPHAGE-XR Take 1 tablet (750 mg total) by mouth 2 (two) times daily with a meal.   metoprolol tartrate 25 MG tablet Commonly known as: LOPRESSOR TAKE 1 TABLET (25  MG TOTAL) BY MOUTH DAILY.   montelukast 10 MG tablet Commonly known as: SINGULAIR TAKE 1 TABLET BY MOUTH EVERY DAY   moxifloxacin 400 MG tablet Commonly known as: AVELOX Take 1 tablet (400 mg total) by mouth daily.   omeprazole 40 MG capsule Commonly known as: PRILOSEC TAKE 1 CAPSULE BY MOUTH EVERY DAY   ONE TOUCH ULTRA 2 w/Device Kit CHECK BLOOD SUGAR EVERY MORNING AS DIRECTED   OneTouch Ultra test strip Generic drug: glucose blood Check BS TID Dx R73.03   predniSONE 10 MG tablet Commonly known as: DELTASONE Take 5 daily for 3 days followed by 4,3,2 and 1 for 3 days each.   SYRINGE/NEEDLE (DISP) 1 ML 23G X 1" 1 ML Misc 1 Syringe by Does not apply route every 30 (thirty) days.   valsartan 320 MG tablet Commonly known as: DIOVAN Take 1 tablet (320 mg total) by mouth daily.         Follow-up: Return in about 2 weeks (around 10/08/2022), or if symptoms worsen or fail to improve.  Mechele Claude, M.D.

## 2022-09-25 ENCOUNTER — Telehealth: Payer: Self-pay | Admitting: Family Medicine

## 2022-09-25 ENCOUNTER — Encounter: Payer: Self-pay | Admitting: Family Medicine

## 2022-09-25 LAB — CMP14+EGFR
ALT: 84 IU/L — ABNORMAL HIGH (ref 0–44)
AST: 36 IU/L (ref 0–40)
Albumin: 4.5 g/dL (ref 3.8–4.8)
Alkaline Phosphatase: 86 IU/L (ref 44–121)
BUN/Creatinine Ratio: 17 (ref 10–24)
BUN: 16 mg/dL (ref 8–27)
Bilirubin Total: 0.7 mg/dL (ref 0.0–1.2)
CO2: 22 mmol/L (ref 20–29)
Chloride: 98 mmol/L (ref 96–106)
Creatinine, Ser: 0.96 mg/dL (ref 0.76–1.27)
Globulin, Total: 2 g/dL (ref 1.5–4.5)
Glucose: 186 mg/dL — ABNORMAL HIGH (ref 70–99)
Potassium: 4.4 mmol/L (ref 3.5–5.2)
Sodium: 138 mmol/L (ref 134–144)
Total Protein: 6.5 g/dL (ref 6.0–8.5)
eGFR: 84 mL/min/{1.73_m2} (ref >59)

## 2022-09-25 LAB — CBC WITH DIFFERENTIAL/PLATELET
Basophils Absolute: 0.1 10*3/uL (ref 0.0–0.2)
Basos: 0 %
EOS (ABSOLUTE): 0.1 10*3/uL (ref 0.0–0.4)
Eos: 1 %
Hemoglobin: 15.3 g/dL (ref 13.0–17.7)
Immature Granulocytes: 1 %
Lymphocytes Absolute: 1.7 10*3/uL (ref 0.7–3.1)
MCHC: 33.5 g/dL (ref 31.5–35.7)
MCV: 96 fL (ref 79–97)
Monocytes: 5 %
Neutrophils Absolute: 9.2 10*3/uL — ABNORMAL HIGH (ref 1.4–7.0)
Neutrophils: 79 %
Platelets: 199 10*3/uL (ref 150–450)
RBC: 4.75 x10E6/uL (ref 4.14–5.80)
WBC: 11.7 10*3/uL — ABNORMAL HIGH (ref 3.4–10.8)

## 2022-09-25 NOTE — Telephone Encounter (Signed)
Pt's wife aware xray report isn't back yet and will route to PCP for review on Monday, if pt worsens and doesn't get better wife states she may take him to [facility] or ED.

## 2022-09-28 NOTE — Progress Notes (Signed)
Hello Juniper,  Your lab result is normal and/or stable.Some minor variations that are not significant are commonly marked abnormal, but do not represent any medical problem for you.  Best regards, Mechele Claude, M.D.

## 2022-09-28 NOTE — Progress Notes (Signed)
Your chest x-ray looked normal. Thanks, WS.

## 2022-09-30 ENCOUNTER — Other Ambulatory Visit: Payer: Self-pay | Admitting: Family Medicine

## 2022-09-30 MED ORDER — CHERATUSSIN AC 100-10 MG/5ML PO SOLN: 5.0000 mL | ORAL | 0 refills | Status: DC | PRN

## 2022-10-02 ENCOUNTER — Telehealth: Payer: Self-pay | Admitting: Family Medicine

## 2022-10-02 ENCOUNTER — Other Ambulatory Visit: Payer: Self-pay | Admitting: Family Medicine

## 2022-10-02 NOTE — Telephone Encounter (Signed)
Patients wife calling to check on scheduling appt for scan, wife said pts sxs are getting worse and wants to know what she needs to do.   Aware that we may have to wait for PCP to advise based on the message that was on the referral about insurance.   Please call back.

## 2022-10-03 DIAGNOSIS — J209 Acute bronchitis, unspecified: Secondary | ICD-10-CM | POA: Diagnosis not present

## 2022-10-03 DIAGNOSIS — W19XXXA Unspecified fall, initial encounter: Secondary | ICD-10-CM | POA: Diagnosis not present

## 2022-10-03 DIAGNOSIS — S50312A Abrasion of left elbow, initial encounter: Secondary | ICD-10-CM | POA: Diagnosis not present

## 2022-10-05 NOTE — Telephone Encounter (Signed)
Patient's wife aware that the insurance company is requiring a peer to peer review to determine if they will authorize this scan.

## 2022-10-06 ENCOUNTER — Ambulatory Visit: Payer: Medicare HMO | Admitting: Family Medicine

## 2022-10-08 NOTE — Telephone Encounter (Signed)
Patient's wife calling to check on this. Said she was told that [employer] would be handling since [employer] was out and they have not heard anything since. She would like to know status of when we will be having the peer to peer with insurance because they are unable to do anything until they speak with our office.

## 2022-10-08 NOTE — Telephone Encounter (Signed)
Toni Amend, can you help?  What is going on with this referral, possibly needs a peer to peer review??

## 2022-10-09 NOTE — Telephone Encounter (Signed)
Great, thank you Toni Amend!!  Will you be calling them directly with the appointment information?

## 2022-10-13 ENCOUNTER — Encounter (HOSPITAL_COMMUNITY): Payer: Self-pay | Admitting: Radiology

## 2022-10-13 ENCOUNTER — Ambulatory Visit (HOSPITAL_COMMUNITY)
Admission: RE | Admit: 2022-10-13 | Discharge: 2022-10-13 | Disposition: A | Discharge status: Home / Self Care | Payer: Medicare HMO | Source: Ambulatory Visit | Attending: Family Medicine | Admitting: Family Medicine

## 2022-10-13 DIAGNOSIS — J340 Abscess, furuncle and carbuncle of nose: Secondary | ICD-10-CM | POA: Diagnosis not present

## 2022-10-13 MED ORDER — IOHEXOL 300 MG/ML  SOLN
75.0000 mL | Freq: Once | INTRAMUSCULAR | Status: AC | PRN
  Administered 2022-10-13: 75 mL via INTRAVENOUS

## 2022-10-15 ENCOUNTER — Encounter: Payer: Self-pay | Admitting: Family Medicine

## 2022-10-15 ENCOUNTER — Ambulatory Visit (INDEPENDENT_AMBULATORY_CARE_PROVIDER_SITE_OTHER): Payer: Medicare HMO | Admitting: Family Medicine

## 2022-10-15 VITALS — BP 132/84 | HR 76 | Temp 97.2°F | Ht 67.0 in | Wt 182.0 lb

## 2022-10-15 DIAGNOSIS — Z7984 Long term (current) use of oral hypoglycemic drugs: Secondary | ICD-10-CM | POA: Diagnosis not present

## 2022-10-15 DIAGNOSIS — E782 Mixed hyperlipidemia: Secondary | ICD-10-CM | POA: Diagnosis not present

## 2022-10-15 DIAGNOSIS — R32 Unspecified urinary incontinence: Secondary | ICD-10-CM | POA: Diagnosis not present

## 2022-10-15 DIAGNOSIS — E119 Type 2 diabetes mellitus without complications: Secondary | ICD-10-CM | POA: Diagnosis not present

## 2022-10-15 DIAGNOSIS — I10 Essential (primary) hypertension: Secondary | ICD-10-CM | POA: Diagnosis not present

## 2022-10-15 LAB — URINALYSIS
Bilirubin, UA: NEGATIVE
Leukocytes,UA: NEGATIVE
Nitrite, UA: NEGATIVE
Protein,UA: NEGATIVE
RBC, UA: NEGATIVE
Specific Gravity, UA: 1.025 (ref 1.005–1.030)
Urobilinogen, Ur: 0.2 mg/dL (ref 0.2–1.0)
pH, UA: 5.5 (ref 5.0–7.5)

## 2022-10-15 LAB — BAYER DCA HB A1C WAIVED: HB A1C (BAYER DCA - WAIVED): 8.2 % — ABNORMAL HIGH (ref 4.8–5.6)

## 2022-10-15 MED ORDER — OMEPRAZOLE 40 MG PO CPDR: 40.0000 mg | DELAYED_RELEASE_CAPSULE | Freq: Two times a day (BID) | ORAL | 3 refills | Status: DC

## 2022-10-15 NOTE — Progress Notes (Signed)
Subjective:  Patient ID: Jimmy Park, male    DOB: Apr 18, 1951  Age: 72 y.o. MRN: [id number]  CC: Medical Management of Chronic Issues   HPI Jimmy Park presents forFollow-up of diabetes. Patient checks blood sugar at home.   120 -170 fasting and not checking postprandial Patient denies symptoms such as polyuria, polydipsia, excessive hunger, nausea No significant hypoglycemic spells noted. Medications reviewed. Pt reports taking them regularly without complication/adverse reaction being reported today.   Doxy from urgent care helped some given 2 weeks ago.  Went because of falling on th eway to the bathroom.   History Jimmy Park has a past medical history of Allergy, Anemia, Asthma, B12 deficiency, Cerebral ventriculomegaly, Gait abnormality, Hypertension, Pneumonia of left lung due to infectious organism (08/21/2020), and URI with cough and congestion (08/21/2020).   He has a past surgical history that includes Lumbar drain implantation.   His family history includes Asthma in his brother; Diabetes in his father and mother; Hypertension in his father and mother.He reports that he quit smoking about 49 years ago. His smoking use included cigarettes. He started smoking about 55 years ago. He smoked an average of .5 packs per day. He has never used smokeless tobacco. He reports that he does not currently use alcohol. He reports that he does not use drugs.  Current Outpatient Medications on File Prior to Visit  Medication Sig Dispense Refill   albuterol (VENTOLIN HFA) 108 (90 Base) MCG/ACT inhaler TAKE 2 PUFFS BY MOUTH EVERY 6 HOURS AS NEEDED FOR WHEEZE OR SHORTNESS OF BREATH 18 each 0   albuterol (VENTOLIN HFA) 108 (90 Base) MCG/ACT inhaler Inhale 2 puffs into the lungs every 6 (six) hours as needed for wheezing or shortness of breath. 8 g 2   aspirin 81 MG tablet Take 1 tablet by mouth daily.     Blood Glucose Monitoring Suppl (ONE TOUCH ULTRA 2) w/Device KIT CHECK BLOOD SUGAR EVERY MORNING AS  DIRECTED 1 kit 0   cyanocobalamin (VITAMIN B12) 1000 MCG/ML injection INJECT 1 ML INTO THE SKIN EVERY 30 DAYS. 3 mL 0   DENTAGEL 1.1 % GEL dental gel SMARTSIG:Application To Teeth     fexofenadine-pseudoephedrine (ALLEGRA-D 24) 180-240 MG 24 hr tablet Take 1 tablet by mouth daily. For allergy and congestion 30 tablet 11   fluticasone (FLONASE) 50 MCG/ACT nasal spray SPRAY 2 SPRAYS INTO EACH NOSTRIL EVERY DAY 48 mL 1   fluticasone-salmeterol (ADVAIR DISKUS) 250-50 MCG/ACT AEPB TAKE 1 PUFF BY MOUTH TWICE A DAY 60 each 2   folic acid (FOLVITE) 1 MG tablet Take 1 tablet by mouth daily.     glucose blood (ONETOUCH ULTRA) test strip Check BS TID Dx R73.03 300 strip 3   guaiFENesin-codeine (CHERATUSSIN AC) 100-10 MG/5ML syrup Take 5 mLs by mouth every 4 (four) hours as needed for cough. 180 mL 0   ibuprofen (ADVIL,MOTRIN) 400 MG tablet Take 1 tablet by mouth 2 (two) times daily as needed.     ketoconazole (NIZORAL) 2 % shampoo Apply topically.     metFORMIN (GLUCOPHAGE-XR) 750 MG 24 hr tablet Take 1 tablet (750 mg total) by mouth 2 (two) times daily with a meal. 60 tablet 2   metoprolol tartrate (LOPRESSOR) 25 MG tablet TAKE 1 TABLET (25 MG TOTAL) BY MOUTH DAILY. 90 tablet 0   montelukast (SINGULAIR) 10 MG tablet TAKE 1 TABLET BY MOUTH EVERY DAY 90 tablet 1   moxifloxacin (AVELOX) 400 MG tablet Take 1 tablet (400 mg total) by mouth daily. 10 tablet 0  Omega-3 Fatty Acids (FISH OIL) 1000 MG CAPS Take by mouth.     predniSONE (DELTASONE) 10 MG tablet Take 5 daily for 3 days followed by 4,3,2 and 1 for 3 days each. 45 tablet 0   SYRINGE/NEEDLE, DISP, 1 ML 23G X 1" 1 ML MISC 1 Syringe by Does not apply route every 30 (thirty) days. 6 each 1   valsartan (DIOVAN) 320 MG tablet Take 1 tablet (320 mg total) by mouth daily. 90 tablet 3   No current facility-administered medications on file prior to visit.    ROS Review of Systems  Constitutional: Negative.  Negative for fever.  HENT: Negative.    Eyes:   Negative for visual disturbance.  Respiratory:  Positive for cough and shortness of breath.   Cardiovascular:  Positive for chest pain (at night for several weeks. lasts about 5 min. Like someone sitting on his chest. Not in the day. Spontaneous relief.). Negative for leg swelling.  Gastrointestinal:  Negative for abdominal pain, diarrhea, nausea and vomiting.  Genitourinary:  Negative for difficulty urinating.  Musculoskeletal:  Negative for arthralgias and myalgias.  Skin:  Negative for rash.  Neurological:  Negative for headaches.  Psychiatric/Behavioral:  Negative for sleep disturbance.     Objective:  BP 132/84   Pulse 76   Temp (!) 97.2 F (36.2 C)   Ht 5\' 7"  (1.702 m)   Wt 182 lb (82.6 kg)   SpO2 95%   BMI 28.51 kg/m   BP Readings from Last 3 Encounters:  10/15/22 132/84  09/24/22 109/62  08/04/22 127/78    Wt Readings from Last 3 Encounters:  10/15/22 182 lb (82.6 kg)  09/24/22 177 lb 9.6 oz (80.6 kg)  08/04/22 178 lb 9.6 oz (81 kg)     Physical Exam Vitals reviewed.  Constitutional:      Appearance: He is well-developed.  HENT:     Head: Normocephalic and atraumatic.     Right Ear: External ear normal.     Left Ear: External ear normal.     Mouth/Throat:     Pharynx: No oropharyngeal exudate or posterior oropharyngeal erythema.  Eyes:     Pupils: Pupils are equal, round, and reactive to light.  Cardiovascular:     Rate and Rhythm: Normal rate and regular rhythm.     Heart sounds: No murmur heard. Pulmonary:     Effort: No respiratory distress.     Breath sounds: Normal breath sounds.  Musculoskeletal:     Cervical back: Normal range of motion and neck supple.  Neurological:     Mental Status: He is alert and oriented to person, place, and time.       Assessment & Plan:   Jimmy Park was seen today for medical management of chronic issues.  Diagnoses and all orders for this visit:  Diabetes mellitus without complication (HCC) -     Bayer DCA Hb  A1c Waived  Benign essential HTN -     CBC with Differential/Platelet -     CMP14+EGFR  Mixed hyperlipidemia -     Lipid panel  Enuresis -     Urinalysis -     Urine Culture  Other orders -     omeprazole (PRILOSEC) 40 MG capsule; Take 1 capsule (40 mg total) by mouth in the morning and at bedtime. Do not eat or drink anything except water for 2 hours before and one hour after each dose      I have changed Jimmy Park's omeprazole. I am  also having him maintain his aspirin, folic acid, ibuprofen, Fish Oil, albuterol, SYRINGE/NEEDLE (DISP) 1 ML, ketoconazole, DentaGel, fluticasone, albuterol, valsartan, metFORMIN, OneTouch Ultra, ONE TOUCH ULTRA 2, cyanocobalamin, fluticasone-salmeterol, moxifloxacin, predniSONE, montelukast, metoprolol tartrate, fexofenadine-pseudoephedrine, and Cheratussin AC.  Meds ordered this encounter  Medications   omeprazole (PRILOSEC) 40 MG capsule    Sig: Take 1 capsule (40 mg total) by mouth in the morning and at bedtime. Do not eat or drink anything except water for 2 hours before and one hour after each dose    Dispense:  180 capsule    Refill:  3     Follow-up: Return in about 3 months (around 01/15/2023), or if symptoms worsen or fail to improve.  Mechele Claude, M.D.

## 2022-10-16 ENCOUNTER — Encounter: Payer: Self-pay | Admitting: Family Medicine

## 2022-10-16 LAB — CBC WITH DIFFERENTIAL/PLATELET
Basophils Absolute: 0.1 10*3/uL (ref 0.0–0.2)
Basos: 1 %
EOS (ABSOLUTE): 0.2 10*3/uL (ref 0.0–0.4)
Eos: 3 %
Hematocrit: 45.1 % (ref 37.5–51.0)
Hemoglobin: 15.3 g/dL (ref 13.0–17.7)
Immature Grans (Abs): 0 10*3/uL (ref 0.0–0.1)
Immature Granulocytes: 0 %
Lymphocytes Absolute: 1.5 10*3/uL (ref 0.7–3.1)
Lymphs: 22 %
MCH: 32.8 pg (ref 26.6–33.0)
MCHC: 33.9 g/dL (ref 31.5–35.7)
MCV: 97 fL (ref 79–97)
Monocytes Absolute: 0.6 10*3/uL (ref 0.1–0.9)
Monocytes: 9 %
Neutrophils Absolute: 4.4 10*3/uL (ref 1.4–7.0)
Neutrophils: 65 %
Platelets: 212 10*3/uL (ref 150–450)
RBC: 4.67 x10E6/uL (ref 4.14–5.80)
RDW: 12.2 % (ref 11.6–15.4)
WBC: 6.8 10*3/uL (ref 3.4–10.8)

## 2022-10-16 LAB — CMP14+EGFR
ALT: 60 IU/L — ABNORMAL HIGH (ref 0–44)
AST: 43 IU/L — ABNORMAL HIGH (ref 0–40)
Albumin/Globulin Ratio: 2.2 (ref 1.2–2.2)
Albumin: 4.6 g/dL (ref 3.8–4.8)
Alkaline Phosphatase: 87 IU/L (ref 44–121)
BUN/Creatinine Ratio: 22 (ref 10–24)
BUN: 18 mg/dL (ref 8–27)
Bilirubin Total: 0.7 mg/dL (ref 0.0–1.2)
CO2: 23 mmol/L (ref 20–29)
Calcium: 9.7 mg/dL (ref 8.6–10.2)
Chloride: 98 mmol/L (ref 96–106)
Creatinine, Ser: 0.81 mg/dL (ref 0.76–1.27)
Globulin, Total: 2.1 g/dL (ref 1.5–4.5)
Glucose: 237 mg/dL — ABNORMAL HIGH (ref 70–99)
Potassium: 4.7 mmol/L (ref 3.5–5.2)
Sodium: 137 mmol/L (ref 134–144)
Total Protein: 6.7 g/dL (ref 6.0–8.5)
eGFR: 94 mL/min/{1.73_m2} (ref >59)

## 2022-10-16 LAB — LIPID PANEL
Chol/HDL Ratio: 5.3 ratio — ABNORMAL HIGH (ref 0.0–5.0)
Cholesterol, Total: 171 mg/dL (ref 100–199)
HDL: 32 mg/dL — ABNORMAL LOW (ref >39)
LDL Chol Calc (NIH): 110 mg/dL — ABNORMAL HIGH (ref 0–99)
Triglycerides: 164 mg/dL — ABNORMAL HIGH (ref 0–149)
VLDL Cholesterol Cal: 29 mg/dL (ref 5–40)

## 2022-10-17 LAB — URINE CULTURE: Organism ID, Bacteria: NO GROWTH

## 2022-10-22 ENCOUNTER — Other Ambulatory Visit: Payer: Self-pay | Admitting: Family Medicine

## 2022-10-27 ENCOUNTER — Other Ambulatory Visit: Payer: Self-pay | Admitting: Family Medicine

## 2022-10-27 DIAGNOSIS — I10 Essential (primary) hypertension: Secondary | ICD-10-CM

## 2022-10-27 DIAGNOSIS — U071 COVID-19: Secondary | ICD-10-CM

## 2022-11-02 DIAGNOSIS — R051 Acute cough: Secondary | ICD-10-CM | POA: Diagnosis not present

## 2022-12-03 ENCOUNTER — Other Ambulatory Visit: Payer: Self-pay | Admitting: Family Medicine

## 2022-12-03 DIAGNOSIS — E538 Deficiency of other specified B group vitamins: Secondary | ICD-10-CM

## 2022-12-22 DIAGNOSIS — Z1283 Encounter for screening for malignant neoplasm of skin: Secondary | ICD-10-CM | POA: Diagnosis not present

## 2022-12-22 DIAGNOSIS — D485 Neoplasm of uncertain behavior of skin: Secondary | ICD-10-CM | POA: Diagnosis not present

## 2022-12-22 DIAGNOSIS — L218 Other seborrheic dermatitis: Secondary | ICD-10-CM | POA: Diagnosis not present

## 2022-12-22 DIAGNOSIS — Z85828 Personal history of other malignant neoplasm of skin: Secondary | ICD-10-CM | POA: Diagnosis not present

## 2023-01-03 ENCOUNTER — Other Ambulatory Visit: Payer: Self-pay | Admitting: Family Medicine

## 2023-01-03 DIAGNOSIS — J4520 Mild intermittent asthma, uncomplicated: Secondary | ICD-10-CM

## 2023-02-02 ENCOUNTER — Encounter: Payer: Self-pay | Admitting: Family Medicine

## 2023-02-02 ENCOUNTER — Ambulatory Visit (INDEPENDENT_AMBULATORY_CARE_PROVIDER_SITE_OTHER): Payer: Medicare HMO | Admitting: Family Medicine

## 2023-02-02 VITALS — BP 149/78 | HR 61 | Temp 97.3°F | Ht 67.0 in | Wt 174.6 lb

## 2023-02-02 DIAGNOSIS — R270 Ataxia, unspecified: Secondary | ICD-10-CM

## 2023-02-02 DIAGNOSIS — E756 Lipid storage disorder, unspecified: Secondary | ICD-10-CM | POA: Diagnosis not present

## 2023-02-02 DIAGNOSIS — Z7984 Long term (current) use of oral hypoglycemic drugs: Secondary | ICD-10-CM | POA: Diagnosis not present

## 2023-02-02 DIAGNOSIS — E782 Mixed hyperlipidemia: Secondary | ICD-10-CM | POA: Diagnosis not present

## 2023-02-02 DIAGNOSIS — G9389 Other specified disorders of brain: Secondary | ICD-10-CM

## 2023-02-02 DIAGNOSIS — E1169 Type 2 diabetes mellitus with other specified complication: Secondary | ICD-10-CM | POA: Insufficient documentation

## 2023-02-02 DIAGNOSIS — E119 Type 2 diabetes mellitus without complications: Secondary | ICD-10-CM | POA: Diagnosis not present

## 2023-02-02 DIAGNOSIS — J329 Chronic sinusitis, unspecified: Secondary | ICD-10-CM

## 2023-02-02 DIAGNOSIS — I10 Essential (primary) hypertension: Secondary | ICD-10-CM | POA: Diagnosis not present

## 2023-02-02 DIAGNOSIS — R269 Unspecified abnormalities of gait and mobility: Secondary | ICD-10-CM

## 2023-02-02 DIAGNOSIS — G912 (Idiopathic) normal pressure hydrocephalus: Secondary | ICD-10-CM

## 2023-02-02 LAB — LIPID PANEL
Chol/HDL Ratio: 4.2 ratio (ref 0.0–5.0)
Cholesterol, Total: 168 mg/dL (ref 100–199)
HDL: 40 mg/dL (ref >39)
LDL Chol Calc (NIH): 104 mg/dL — ABNORMAL HIGH (ref 0–99)
Triglycerides: 134 mg/dL (ref 0–149)
VLDL Cholesterol Cal: 24 mg/dL (ref 5–40)

## 2023-02-02 LAB — BAYER DCA HB A1C WAIVED: HB A1C (BAYER DCA - WAIVED): 6.2 % — ABNORMAL HIGH (ref 4.8–5.6)

## 2023-02-02 LAB — CBC WITH DIFFERENTIAL/PLATELET
Basophils Absolute: 0 10*3/uL (ref 0.0–0.2)
Basos: 1 %
EOS (ABSOLUTE): 0.2 10*3/uL (ref 0.0–0.4)
Eos: 4 %
Hematocrit: 42 % (ref 37.5–51.0)
Hemoglobin: 14.6 g/dL (ref 13.0–17.7)
Immature Grans (Abs): 0 10*3/uL (ref 0.0–0.1)
Immature Granulocytes: 0 %
Lymphocytes Absolute: 1.5 10*3/uL (ref 0.7–3.1)
Lymphs: 22 %
MCH: 32.5 pg (ref 26.6–33.0)
MCHC: 34.8 g/dL (ref 31.5–35.7)
MCV: 94 fL (ref 79–97)
Monocytes Absolute: 0.5 10*3/uL (ref 0.1–0.9)
Monocytes: 7 %
Neutrophils Absolute: 4.4 10*3/uL (ref 1.4–7.0)
Neutrophils: 66 %
Platelets: 215 10*3/uL (ref 150–450)
RBC: 4.49 x10E6/uL (ref 4.14–5.80)
RDW: 11.2 % — ABNORMAL LOW (ref 11.6–15.4)
WBC: 6.7 10*3/uL (ref 3.4–10.8)

## 2023-02-02 LAB — CMP14+EGFR
ALT: 41 IU/L (ref 0–44)
AST: 40 IU/L (ref 0–40)
Albumin: 4.5 g/dL (ref 3.8–4.8)
Alkaline Phosphatase: 114 IU/L (ref 44–121)
BUN/Creatinine Ratio: 18 (ref 10–24)
BUN: 15 mg/dL (ref 8–27)
Bilirubin Total: 0.5 mg/dL (ref 0.0–1.2)
CO2: 25 mmol/L (ref 20–29)
Calcium: 9.3 mg/dL (ref 8.6–10.2)
Chloride: 99 mmol/L (ref 96–106)
Creatinine, Ser: 0.85 mg/dL (ref 0.76–1.27)
Globulin, Total: 2.4 g/dL (ref 1.5–4.5)
Glucose: 127 mg/dL — ABNORMAL HIGH (ref 70–99)
Potassium: 4.6 mmol/L (ref 3.5–5.2)
Sodium: 138 mmol/L (ref 134–144)
Total Protein: 6.9 g/dL (ref 6.0–8.5)
eGFR: 92 mL/min/{1.73_m2} (ref >59)

## 2023-02-02 NOTE — Progress Notes (Signed)
Subjective:  Patient ID: Jimmy Park,  male    DOB: Jan 04, 1951  Age: 72 y.o.    CC: Medical Management of Chronic Issues   HPI Guled Falkowitz presents for  follow-up of hypertension. Patient has no history of headache chest pain or shortness of breath or recent cough. Patient also denies symptoms of TIA such as numbness weakness lateralizing. Patient denies side effects from medication. States taking it regularly.  Patient also  in for follow-up of elevated cholesterol. Doing well without complaints on current medication. Denies side effects  including myalgia and arthralgia and nausea. Also in today for liver function testing. Currently no chest pain, shortness of breath or other cardiovascular related symptoms noted.  Follow-up of diabetes. Patient does check blood sugar at home. Readings run between 120s and 140 Patient denies symptoms such as excessive hunger or urinary frequency, excessive hunger, nausea No significant hypoglycemic spells noted. Medications reviewed. Pt reports taking them regularly. Pt. denies complication/adverse reaction today.   Cough still present. Having Posterior drainage frequently as well. Denies dyspnea.   Pt. Reports falling easily. Has hx of NPH. Procedure done at [facility], 12/18/2014, ventriculostomy. . Now balance worsening. Was seen at [facility] by Dr. Kennis Carina of neurosurgery  02/2022 and felt not to be a shunt candidate due to risk of bleed and lack of assurance it would lead to improvement.   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   He has a past surgical history that includes Lumbar drain implantation.   His family history includes Asthma in his brother; Diabetes in his father and mother;  Hypertension in his father and mother.He reports that he quit smoking about 49 years ago. His smoking use included cigarettes. He started smoking about 55 years ago. He has a 3 pack-year smoking history. He has never used smokeless tobacco. He reports that he does not currently use alcohol. He reports that he does not use drugs.  Current Outpatient Medications on File Prior to Visit  Medication Sig Dispense Refill   albuterol (VENTOLIN HFA) 108 (90 Base) MCG/ACT inhaler TAKE 2 PUFFS BY MOUTH EVERY 6 HOURS AS NEEDED FOR WHEEZE OR SHORTNESS OF BREATH 18 each 0   albuterol (VENTOLIN HFA) 108 (90 Base) MCG/ACT inhaler Inhale 2 puffs into the lungs every 6 (six) hours as needed for wheezing or shortness of breath. 8 g 2   aspirin 81 MG tablet Take 1 tablet by mouth daily.     Blood Glucose Monitoring Suppl (ONE TOUCH ULTRA 2) w/Device KIT CHECK BLOOD SUGAR EVERY MORNING AS DIRECTED 1 kit 0   cyanocobalamin (VITAMIN B12) 1000 MCG/ML injection INJECT 1 ML SUBCUTANEOUSLY EVERY 30 DAYS 3 mL 1   DENTAGEL 1.1 % GEL dental gel SMARTSIG:Application To Teeth     fexofenadine-pseudoephedrine (ALLEGRA-D 24) 180-240 MG 24 hr tablet Take 1 tablet by mouth daily. For allergy and congestion 30 tablet 11   fluticasone (FLONASE) 50 MCG/ACT nasal spray SPRAY 2 SPRAYS INTO EACH NOSTRIL EVERY DAY 48 mL 3   fluticasone-salmeterol (ADVAIR DISKUS) 250-50 MCG/ACT AEPB TAKE 1 PUFF BY MOUTH TWICE A DAY 60 each 2   folic acid (FOLVITE) 1 MG tablet Take 1 tablet by mouth daily.     glucose blood (ONETOUCH ULTRA) test strip Check BS TID Dx R73.03 300 strip 3   ibuprofen (ADVIL,MOTRIN)  400 MG tablet Take 1 tablet by mouth 2 (two) times daily as needed.     ketoconazole (NIZORAL) 2 % shampoo Apply topically.     Lancets (ONETOUCH DELICA PLUS LANCET33G) MISC Check BS TID Dx R73.03 300 each 3   metFORMIN (GLUCOPHAGE-XR) 750 MG 24 hr tablet TAKE 1 TABLET (750 MG TOTAL) BY MOUTH 2 (TWO) TIMES DAILY WITH A MEAL. 180 tablet 1   metoprolol  tartrate (LOPRESSOR) 25 MG tablet TAKE 1 TABLET (25 MG TOTAL) BY MOUTH DAILY. 90 tablet 0   montelukast (SINGULAIR) 10 MG tablet TAKE 1 TABLET BY MOUTH EVERY DAY 90 tablet 1   Omega-3 Fatty Acids (FISH OIL) 1000 MG CAPS Take by mouth.     omeprazole (PRILOSEC) 40 MG capsule Take 1 capsule (40 mg total) by mouth in the morning and at bedtime. Do not eat or drink anything except water for 2 hours before and one hour after each dose 180 capsule 3   valsartan (DIOVAN) 320 MG tablet Take 1 tablet (320 mg total) by mouth daily. 90 tablet 3   No current facility-administered medications on file prior to visit.    ROS Review of Systems  Constitutional:  Negative for fever.  Respiratory:  Negative for shortness of breath.   Cardiovascular:  Negative for chest pain.  Musculoskeletal:  Positive for gait problem (poor balance falls frequently unless he uses walker. In [facility] today). Negative for arthralgias.  Skin:  Negative for rash.    Objective:  BP (!) 149/78   Pulse 61   Temp (!) 97.3 F (36.3 C)   Ht 5\' 7"  (1.702 m)   Wt 174 lb 9.6 oz (79.2 kg)   SpO2 94%   BMI 27.35 kg/m   BP Readings from Last 3 Encounters:  02/02/23 (!) 149/78  10/15/22 132/84  09/24/22 109/62    Wt Readings from Last 3 Encounters:  02/02/23 174 lb 9.6 oz (79.2 kg)  10/15/22 182 lb (82.6 kg)  09/24/22 177 lb 9.6 oz (80.6 kg)     Physical Exam Vitals reviewed.  Constitutional:      Appearance: He is well-developed.  HENT:     Head: Normocephalic and atraumatic.     Right Ear: External ear normal.     Left Ear: External ear normal.     Mouth/Throat:     Pharynx: No oropharyngeal exudate or posterior oropharyngeal erythema.  Eyes:     Pupils: Pupils are equal, round, and reactive to light.  Cardiovascular:     Rate and Rhythm: Normal rate and regular rhythm.     Heart sounds: No murmur heard. Pulmonary:     Effort: No respiratory distress.     Breath sounds: Normal breath sounds.  Musculoskeletal:      Cervical back: Normal range of motion and neck supple.     Comments: In [facility] today, uses a walker at home  Neurological:     Mental Status: He is alert and oriented to person, place, and time.     Diabetic Foot Exam - Simple   No data filed     Lab Results  Component Value Date   HGBA1C 6.2 (H) 02/02/2023   HGBA1C 8.2 (H) 10/15/2022   HGBA1C 9.9 (H) 07/07/2022    Assessment & Plan:   Taijuan was seen today for medical management of chronic issues.  Diagnoses and all orders for this visit:  Diabetic lipidosis (HCC) -     Bayer DCA Hb A1c Waived  Benign essential HTN -  CBC with Differential/Platelet -     CMP14+EGFR  Mixed hyperlipidemia -     Lipid panel  Chronic congestion of paranasal sinus -     Ambulatory referral to ENT  Normal pressure hydrocephalus (HCC)  Abnormal gait  Ataxia  Cerebral ventriculomegaly   I have discontinued Conley Bartnik's SYRINGE/NEEDLE (DISP) 1 ML, moxifloxacin, predniSONE, and Cheratussin AC. I am also having him maintain his aspirin, folic acid, ibuprofen, Fish Oil, albuterol, ketoconazole, DentaGel, albuterol, valsartan, OneTouch Ultra, ONE TOUCH ULTRA 2, fluticasone-salmeterol, fexofenadine-pseudoephedrine, omeprazole, OneTouch Delica Plus Lancet33G, fluticasone, metoprolol tartrate, metFORMIN, cyanocobalamin, and montelukast.  No orders of the defined types were placed in this encounter.    Follow-up: Return in about 3 months (around 05/05/2023).  Mechele Claude, M.D.

## 2023-02-03 NOTE — Progress Notes (Signed)
Hello Juniper,  Your lab result is normal and/or stable.Some minor variations that are not significant are commonly marked abnormal, but do not represent any medical problem for you.  Best regards, Mechele Claude, M.D.

## 2023-02-25 ENCOUNTER — Telehealth: Payer: Self-pay | Admitting: Family Medicine

## 2023-02-25 ENCOUNTER — Other Ambulatory Visit: Payer: Self-pay | Admitting: Family Medicine

## 2023-02-25 DIAGNOSIS — R269 Unspecified abnormalities of gait and mobility: Secondary | ICD-10-CM

## 2023-02-26 ENCOUNTER — Other Ambulatory Visit: Payer: Self-pay | Admitting: Family Medicine

## 2023-03-08 DIAGNOSIS — R269 Unspecified abnormalities of gait and mobility: Secondary | ICD-10-CM | POA: Diagnosis not present

## 2023-03-11 DIAGNOSIS — R269 Unspecified abnormalities of gait and mobility: Secondary | ICD-10-CM | POA: Diagnosis not present

## 2023-03-11 DIAGNOSIS — H401221 Low-tension glaucoma, left eye, mild stage: Secondary | ICD-10-CM | POA: Diagnosis not present

## 2023-03-11 DIAGNOSIS — H524 Presbyopia: Secondary | ICD-10-CM | POA: Diagnosis not present

## 2023-03-11 DIAGNOSIS — Z0100 Encounter for examination of eyes and vision without abnormal findings: Secondary | ICD-10-CM | POA: Diagnosis not present

## 2023-03-11 LAB — HM DIABETES EYE EXAM

## 2023-03-12 DIAGNOSIS — N529 Male erectile dysfunction, unspecified: Secondary | ICD-10-CM | POA: Diagnosis not present

## 2023-03-12 DIAGNOSIS — H409 Unspecified glaucoma: Secondary | ICD-10-CM | POA: Diagnosis not present

## 2023-03-12 DIAGNOSIS — E785 Hyperlipidemia, unspecified: Secondary | ICD-10-CM | POA: Diagnosis not present

## 2023-03-12 DIAGNOSIS — K219 Gastro-esophageal reflux disease without esophagitis: Secondary | ICD-10-CM | POA: Diagnosis not present

## 2023-03-12 DIAGNOSIS — I129 Hypertensive chronic kidney disease with stage 1 through stage 4 chronic kidney disease, or unspecified chronic kidney disease: Secondary | ICD-10-CM | POA: Diagnosis not present

## 2023-03-12 DIAGNOSIS — N189 Chronic kidney disease, unspecified: Secondary | ICD-10-CM | POA: Diagnosis not present

## 2023-03-12 DIAGNOSIS — J4489 Other specified chronic obstructive pulmonary disease: Secondary | ICD-10-CM | POA: Diagnosis not present

## 2023-03-12 DIAGNOSIS — E1122 Type 2 diabetes mellitus with diabetic chronic kidney disease: Secondary | ICD-10-CM | POA: Diagnosis not present

## 2023-03-12 DIAGNOSIS — I2510 Atherosclerotic heart disease of native coronary artery without angina pectoris: Secondary | ICD-10-CM | POA: Diagnosis not present

## 2023-03-12 DIAGNOSIS — Z79899 Other long term (current) drug therapy: Secondary | ICD-10-CM | POA: Diagnosis not present

## 2023-03-12 DIAGNOSIS — Z008 Encounter for other general examination: Secondary | ICD-10-CM | POA: Diagnosis not present

## 2023-03-12 DIAGNOSIS — J309 Allergic rhinitis, unspecified: Secondary | ICD-10-CM | POA: Diagnosis not present

## 2023-03-12 DIAGNOSIS — G912 (Idiopathic) normal pressure hydrocephalus: Secondary | ICD-10-CM | POA: Diagnosis not present

## 2023-03-12 LAB — US OUTSIDE FILMS EXTREMETIES: Albumin, Urine POC: <30

## 2023-03-15 DIAGNOSIS — R269 Unspecified abnormalities of gait and mobility: Secondary | ICD-10-CM | POA: Diagnosis not present

## 2023-03-17 DIAGNOSIS — R269 Unspecified abnormalities of gait and mobility: Secondary | ICD-10-CM | POA: Diagnosis not present

## 2023-03-18 ENCOUNTER — Other Ambulatory Visit: Payer: Self-pay | Admitting: Family Medicine

## 2023-03-18 DIAGNOSIS — I10 Essential (primary) hypertension: Secondary | ICD-10-CM

## 2023-03-23 DIAGNOSIS — R269 Unspecified abnormalities of gait and mobility: Secondary | ICD-10-CM | POA: Diagnosis not present

## 2023-03-24 ENCOUNTER — Encounter (INDEPENDENT_AMBULATORY_CARE_PROVIDER_SITE_OTHER): Payer: Self-pay

## 2023-03-24 ENCOUNTER — Ambulatory Visit (INDEPENDENT_AMBULATORY_CARE_PROVIDER_SITE_OTHER): Payer: Medicare HMO | Admitting: Otolaryngology

## 2023-03-24 VITALS — Ht 69.0 in | Wt 180.0 lb

## 2023-03-24 DIAGNOSIS — J342 Deviated nasal septum: Secondary | ICD-10-CM

## 2023-03-24 DIAGNOSIS — R0981 Nasal congestion: Secondary | ICD-10-CM

## 2023-03-24 DIAGNOSIS — H6121 Impacted cerumen, right ear: Secondary | ICD-10-CM | POA: Diagnosis not present

## 2023-03-24 DIAGNOSIS — H938X1 Other specified disorders of right ear: Secondary | ICD-10-CM

## 2023-03-24 DIAGNOSIS — R059 Cough, unspecified: Secondary | ICD-10-CM | POA: Diagnosis not present

## 2023-03-24 DIAGNOSIS — R0982 Postnasal drip: Secondary | ICD-10-CM

## 2023-03-24 NOTE — Progress Notes (Signed)
Dear Dr. Darlyn Read, Here is my assessment for our mutual patient, Jimmy Park. Thank you for allowing me the opportunity to care for your patient. Please do not hesitate to contact me should you have any other questions. Sincerely, Dr. Jovita Kussmaul  Otolaryngology Clinic Note  HISTORY: Jimmy Park is a 73 y.o. male with history of NPH, ataxia, cerebral ventriculomegaly kindly referred by Dr. Darlyn Read for evaluation of chronic congestion and post nasal drip. He reports that he has post nasal drip. He was having some cough after a URI and was thought to be pulmonary in origin, prescribed antibiotics and steroids with some help. The cough has now resolved over the past several weeks. He does have seasonal allergies - for which he takes montelukast and Allegra. He did have several episodes of nasal congestion, PND, and rhinorrhea for which he was treated and sinus CT was done in May 2024.  He wishes to have his nose and throat examined.  No headaches or pulsatile tinnitus  His main complaint today is that he does report some bilateral ear pressure and fullness, but mostly on right. No ear drainage, vertigo, or other symptoms. Has hearing aids, but does not wear them due to discomfort. Hearing was checked by [facility] in [place], [place] and hearing was reported to be stable.   He does use Flonase every day, and uses allegra D. Works well for his allergies.   PMHx: Seasonal allergies and Asthma (on advair and albuterol), NPH. No heart attacks or strokes.   No ENT surgeries.  Past Medical History:  Diagnosis Date   Allergy    Anemia    Asthma    B12 deficiency    Cerebral ventriculomegaly    Gait abnormality    Hypertension    Increased intracranial pressure 02/12/2012   Formatting of this note might be different from the original.  Per telephone call from Dr.Dillon Dilaldigala [phone] MRI     Pneumonia of left lung due to infectious organism 08/21/2020   URI with cough and congestion  08/21/2020   Past Surgical History:  Procedure Laterality Date   LUMBAR DRAIN IMPLANTATION     Family History  Problem Relation Age of Onset   Diabetes Mother    Hypertension Mother    Diabetes Father    Hypertension Father    Asthma Brother    Social History   Tobacco Use   Smoking status: Former    Current packs/day: 0.00    Average packs/day: 0.5 packs/day for 6.0 years (3.0 ttl pk-yrs)    Types: Cigarettes    Start date: 06/09/1967    Quit date: 06/08/1973    Years since quitting: 49.8   Smokeless tobacco: Never  Substance Use Topics   Alcohol use: Not Currently    Alcohol/week: 0.0 standard drinks of alcohol   No Known Allergies Current Outpatient Medications  Medication Sig Dispense Refill   albuterol (VENTOLIN HFA) 108 (90 Base) MCG/ACT inhaler TAKE 2 PUFFS BY MOUTH EVERY 6 HOURS AS NEEDED FOR WHEEZE OR SHORTNESS OF BREATH 18 each 0   albuterol (VENTOLIN HFA) 108 (90 Base) MCG/ACT inhaler Inhale 2 puffs into the lungs every 6 (six) hours as needed for wheezing or shortness of breath. 8 g 2   aspirin 81 MG tablet Take 1 tablet by mouth daily.     Blood Glucose Monitoring Suppl (ONE TOUCH ULTRA 2) w/Device KIT CHECK BLOOD SUGAR EVERY MORNING AS DIRECTED 1 kit 0   cyanocobalamin (VITAMIN B12) 1000 MCG/ML injection INJECT 1  ML SUBCUTANEOUSLY EVERY 30 DAYS 3 mL 1   DENTAGEL 1.1 % GEL dental gel SMARTSIG:Application To Teeth     fexofenadine-pseudoephedrine (ALLEGRA-D 24) 180-240 MG 24 hr tablet Take 1 tablet by mouth daily. For allergy and congestion 30 tablet 11   fluticasone (FLONASE) 50 MCG/ACT nasal spray SPRAY 2 SPRAYS INTO EACH NOSTRIL EVERY DAY 48 mL 3   folic acid (FOLVITE) 1 MG tablet Take 1 tablet by mouth daily.     glucose blood (ONETOUCH ULTRA) test strip Check BS TID Dx R73.03 300 strip 3   ibuprofen (ADVIL,MOTRIN) 400 MG tablet Take 1 tablet by mouth 2 (two) times daily as needed.     ketoconazole (NIZORAL) 2 % shampoo Apply topically.     Lancets (ONETOUCH  DELICA PLUS LANCET33G) MISC Check BS TID Dx R73.03 300 each 3   metFORMIN (GLUCOPHAGE-XR) 750 MG 24 hr tablet TAKE 1 TABLET (750 MG TOTAL) BY MOUTH 2 (TWO) TIMES DAILY WITH A MEAL. 180 tablet 0   metoprolol tartrate (LOPRESSOR) 25 MG tablet TAKE 1 TABLET (25 MG TOTAL) BY MOUTH DAILY. 90 tablet 0   montelukast (SINGULAIR) 10 MG tablet TAKE 1 TABLET BY MOUTH EVERY DAY 90 tablet 1   Omega-3 Fatty Acids (FISH OIL) 1000 MG CAPS Take by mouth.     omeprazole (PRILOSEC) 40 MG capsule Take 1 capsule (40 mg total) by mouth in the morning and at bedtime. Do not eat or drink anything except water for 2 hours before and one hour after each dose 180 capsule 3   valsartan (DIOVAN) 320 MG tablet Take 1 tablet (320 mg total) by mouth daily. 90 tablet 3   fluticasone-salmeterol (ADVAIR DISKUS) 250-50 MCG/ACT AEPB TAKE 1 PUFF BY MOUTH TWICE A DAY (Patient not taking: Reported on 03/24/2023) 60 each 2   No current facility-administered medications for this visit.   Ht 5\' 9"  (1.753 m)   Wt 180 lb (81.6 kg)   BMI 26.58 kg/m   PHYSICAL EXAM:  Ht 5\' 9"  (1.753 m)   Wt 180 lb (81.6 kg)   BMI 26.58 kg/m   Salient findings:  CN II-XII intact Right ear canal with cerumen impaction.  No impaction on the left.  After removal, bilateral EAC clear and TM intact with well pneumatized middle ear spaces Weber 512: Midline Rinne 512: AC > BC b/l Rine 1024: AC > BC b/l Nose: Anterior rhinoscopy reveals leftward nasal septal deviation and mild inferior turbinate hypertrophy.  Nasal endoscopy was indicated to better evaluate the nose and paranasal sinuses, given the patient's history and exam findings, and is detailed below. No lesions of oral cavity/oropharynx; dentition fair No obviously palpable neck masses/lymphadenopathy/thyromegaly No respiratory distress or stridor  Voice quality class 2  Procedure Note Pre-procedure diagnosis: Concern for sinusitis, cough Post-procedure diagnosis: Same Procedure: Transnasal  Fiberoptic Laryngoscopy, CPT 40981 - Mod 25 Indication: see above Complications: None apparent EBL: 0 mL Date: 03/26/23   The procedure was undertaken to further evaluate the patient's complaint of sinusitis, post nasal drip and cough, with mirror exam inadequate for appropriate examination due to gag reflex and poor patient tolerance  Procedure:  Patient was identified as correct patient. Verbal consent was obtained. The nose was sprayed with oxymetazoline and 4% lidocaine. The The flexible laryngoscope was passed through the nose to view the nasal cavity, pharynx (oropharynx, hypopharynx) and larynx.  The larynx was examined at rest and during multiple phonatory tasks. Documentation was obtained and reviewed with patient. The scope was removed. The patient  tolerated the procedure well.  Findings: The nasal cavity and nasopharynx did not reveal any masses or lesions, mucosa appeared to be without obvious lesions. The tongue base, pharyngeal walls, piriform sinuses, vallecula, epiglottis and postcricoid region are normal in appearance without obvious masses. B/l MM and SE recesses clear. The visualized portion of the subglottis and proximal trachea is widely patent. The vocal folds are mobile bilaterally. There are no lesions on the free edge of the vocal folds nor elsewhere in the larynx worrisome for malignancy.    Electronically signed by: Read Drivers, MD 03/26/2023 10:07 AM   CPT CODE -- 16109 - Mod 25  Procedure: Bilateral ear microscopy and cerumen removal using microscope (CPT [phone]) - Mod 25 Pre-procedure diagnosis: Cerumen impaction right external ear Post-procedure diagnosis: same Indication: Right cerumen impaction; given patient's otologic complaints and history as well as for improved and comprehensive examination of external ear and tympanic membrane, bilateral otologic examination using microscope was performed and impacted cerumen removed  Procedure: Patient was placed  semi-recumbent. Both ear canals were examined using the microscope with findings above. Cerumen removed on right using suction and currette with improvement in EAC examination and patency. Patient tolerated the procedure well.  RADIOGRAPHIC EVALUATION AND INDEPENDENT REVIEW OF OTHER RECORDS:: CT Face 10/13/22 was indepedently interpreted showing: No significant sinonasal opacification, with hyper pneumatized mastoid including the petrous apex.  No significant middle ear or mastoid effusion.  No obvious other bony pathology of the ear noted.  Ventriculomegaly noted.  Tegmen then bilaterally and cuts are not fan, but no obvious large herniating encephalocele noted into the temporal bone  ASSESSMENT:  72 yo M with history of normal pressure hydrocephalus who presents with: Right greater than left ear fullness and pressure Right cerumen impaction: He had a large cerumen impaction which was cleared.  After clearance he noted to have immediate benefit.  He does not have other obvious hearing changes, and has been getting regular hearing exams and eating without issue.  We discussed need for audiogram, but given his recent audiogram and stable hearing he wishes to defer.  Eustachian tube dysfunction is in the differential diagnosis but I suspect that given the size of his impaction, this was the most likely cause of his symptoms. Nasal congestion and postnasal drip: His recent CT looks without significant disease, and his symptoms are in general well-controlled with home Flonase and Allegra.  He does not report nasal obstruction, but does have a leftward septal deviation.  Given this, I do not think a septoplasty would necessarily help him. - increase flonase 2 puffs each nostril BID given recent exacerbation - Continue home Allegra  MDM:  Level 4: 99204 Complexity/Problems addressed: low Data complexity: mod - Morbidity: mod - Prescription Drug prescribed or managed: yes    - f/u PRN after  discussion re: follow up for hearing check; return precautions including return of symptoms or persistence in symptoms discussed  Thank you for allowing me the opportunity to care for your patient. Please do not hesitate to contact me should you have any other questions.  Sincerely, Jovita Kussmaul, MD Otolarynoglogist (ENT), [facility] Phone: [phone] Fax: [phone]  03/24/2023, 10:43 AM

## 2023-03-25 DIAGNOSIS — R269 Unspecified abnormalities of gait and mobility: Secondary | ICD-10-CM | POA: Diagnosis not present

## 2023-03-30 DIAGNOSIS — R269 Unspecified abnormalities of gait and mobility: Secondary | ICD-10-CM | POA: Diagnosis not present

## 2023-04-01 DIAGNOSIS — R269 Unspecified abnormalities of gait and mobility: Secondary | ICD-10-CM | POA: Diagnosis not present

## 2023-04-06 DIAGNOSIS — R269 Unspecified abnormalities of gait and mobility: Secondary | ICD-10-CM | POA: Diagnosis not present

## 2023-04-08 DIAGNOSIS — R269 Unspecified abnormalities of gait and mobility: Secondary | ICD-10-CM | POA: Diagnosis not present

## 2023-04-09 ENCOUNTER — Other Ambulatory Visit: Payer: Self-pay | Admitting: Family Medicine

## 2023-04-13 DIAGNOSIS — R269 Unspecified abnormalities of gait and mobility: Secondary | ICD-10-CM | POA: Diagnosis not present

## 2023-04-15 DIAGNOSIS — R269 Unspecified abnormalities of gait and mobility: Secondary | ICD-10-CM | POA: Diagnosis not present

## 2023-04-21 DIAGNOSIS — R269 Unspecified abnormalities of gait and mobility: Secondary | ICD-10-CM | POA: Diagnosis not present

## 2023-04-23 DIAGNOSIS — R269 Unspecified abnormalities of gait and mobility: Secondary | ICD-10-CM | POA: Diagnosis not present

## 2023-04-27 DIAGNOSIS — R269 Unspecified abnormalities of gait and mobility: Secondary | ICD-10-CM | POA: Diagnosis not present

## 2023-04-29 DIAGNOSIS — J40 Bronchitis, not specified as acute or chronic: Secondary | ICD-10-CM | POA: Diagnosis not present

## 2023-04-29 DIAGNOSIS — R051 Acute cough: Secondary | ICD-10-CM | POA: Diagnosis not present

## 2023-05-10 ENCOUNTER — Ambulatory Visit (INDEPENDENT_AMBULATORY_CARE_PROVIDER_SITE_OTHER): Payer: Medicare HMO | Admitting: Family Medicine

## 2023-05-10 ENCOUNTER — Encounter: Payer: Self-pay | Admitting: Family Medicine

## 2023-05-10 VITALS — BP 132/70 | HR 68 | Temp 98.0°F | Ht 69.0 in | Wt 172.0 lb

## 2023-05-10 DIAGNOSIS — I10 Essential (primary) hypertension: Secondary | ICD-10-CM | POA: Diagnosis not present

## 2023-05-10 DIAGNOSIS — R079 Chest pain, unspecified: Secondary | ICD-10-CM

## 2023-05-10 DIAGNOSIS — E538 Deficiency of other specified B group vitamins: Secondary | ICD-10-CM | POA: Diagnosis not present

## 2023-05-10 DIAGNOSIS — Z7984 Long term (current) use of oral hypoglycemic drugs: Secondary | ICD-10-CM | POA: Diagnosis not present

## 2023-05-10 DIAGNOSIS — E559 Vitamin D deficiency, unspecified: Secondary | ICD-10-CM | POA: Diagnosis not present

## 2023-05-10 DIAGNOSIS — E782 Mixed hyperlipidemia: Secondary | ICD-10-CM | POA: Diagnosis not present

## 2023-05-10 DIAGNOSIS — E119 Type 2 diabetes mellitus without complications: Secondary | ICD-10-CM | POA: Diagnosis not present

## 2023-05-10 LAB — BAYER DCA HB A1C WAIVED: HB A1C (BAYER DCA - WAIVED): 6.3 % — ABNORMAL HIGH (ref 4.8–5.6)

## 2023-05-10 MED ORDER — METFORMIN HCL ER 750 MG PO TB24: 750.0000 mg | ORAL_TABLET | Freq: Two times a day (BID) | ORAL | 0 refills | Status: DC

## 2023-05-10 MED ORDER — OMEPRAZOLE 40 MG PO CPDR: 40.0000 mg | DELAYED_RELEASE_CAPSULE | Freq: Every day | ORAL | 3 refills | Status: DC

## 2023-05-10 MED ORDER — METOPROLOL TARTRATE 25 MG PO TABS: 25.0000 mg | ORAL_TABLET | Freq: Every day | ORAL | 3 refills | Status: DC

## 2023-05-10 MED ORDER — VALSARTAN 320 MG PO TABS: 320.0000 mg | ORAL_TABLET | Freq: Every day | ORAL | 3 refills | Status: AC

## 2023-05-10 NOTE — Progress Notes (Signed)
Subjective:  Patient ID: Jimmy Park,  male    DOB: Jun 08, 1951  Age: 72 y.o.    CC: Medical Management of Chronic Issues   HPI Mikhail Sardinas presents for  follow-up of hypertension. Patient has no history of headache chest pain or shortness of breath or recent cough. Patient also denies symptoms of TIA such as numbness weakness lateralizing. Patient denies side effects from medication. States taking it regularly.  Patient also  in for follow-up of elevated cholesterol. Doing well without complaints on current medication. Denies side effects  including myalgia and arthralgia and nausea. Also in today for liver function testing. Currently no chest pain, shortness of breath or other cardiovascular related symptoms noted.  Follow-up of diabetes. Patient does check blood sugar at home. Readings run "good" Patient denies symptoms such as excessive hunger or urinary frequency, excessive hunger, nausea No significant hypoglycemic spells noted. Medications reviewed. Pt reports taking them regularly. Pt. denies complication/adverse reaction today.    History Edenilson has a past medical history of Allergy, Anemia, Asthma, B12 deficiency, Cerebral ventriculomegaly, Gait abnormality, Hypertension, 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   He has a past surgical history that includes Lumbar drain implantation.   His family history includes Asthma in his brother; Diabetes in his father and mother; Hypertension in his father and mother.He reports that he quit smoking about 49 years ago. His smoking use included cigarettes. He started smoking about 55 years ago. He has a 3 pack-year smoking history. He has never used smokeless tobacco. He reports that he does not currently use alcohol. He reports that he does not use drugs.  Current Outpatient Medications on File Prior to Visit  Medication  Sig Dispense Refill   albuterol (VENTOLIN HFA) 108 (90 Base) MCG/ACT inhaler Inhale 2 puffs into the lungs every 6 (six) hours as needed for wheezing or shortness of breath. 8 g 2   aspirin 81 MG tablet Take 1 tablet by mouth daily.     Blood Glucose Monitoring Suppl (ONE TOUCH ULTRA 2) w/Device KIT CHECK BLOOD SUGAR EVERY MORNING AS DIRECTED 1 kit 0   cyanocobalamin (VITAMIN B12) 1000 MCG/ML injection INJECT 1 ML SUBCUTANEOUSLY EVERY 30 DAYS 3 mL 1   DENTAGEL 1.1 % GEL dental gel SMARTSIG:Application To Teeth     fexofenadine-pseudoephedrine (ALLEGRA-D 24) 180-240 MG 24 hr tablet Take 1 tablet by mouth daily. For allergy and congestion 30 tablet 11   fluticasone (FLONASE) 50 MCG/ACT nasal spray SPRAY 2 SPRAYS INTO EACH NOSTRIL EVERY DAY 48 mL 3   fluticasone-salmeterol (ADVAIR DISKUS) 250-50 MCG/ACT AEPB TAKE 1 PUFF BY MOUTH TWICE A DAY 60 each 2   folic acid (FOLVITE) 1 MG tablet Take 1 tablet by mouth daily.     glucose blood (ONETOUCH ULTRA) test strip Check BS TID Dx R73.03 300 strip 3   ibuprofen (ADVIL,MOTRIN) 400 MG tablet Take 1 tablet by mouth 2 (two) times daily as needed.     ketoconazole (NIZORAL) 2 % shampoo Apply topically.     Lancets (ONETOUCH DELICA PLUS LANCET33G) MISC Check BS TID Dx R73.03 300 each 3   montelukast (SINGULAIR) 10 MG tablet TAKE 1 TABLET BY MOUTH EVERY DAY 90 tablet 1   Omega-3 Fatty Acids (FISH OIL) 1000 MG CAPS Take by mouth.     No current facility-administered medications on file prior to visit.    ROS Review of Systems  Constitutional:  Negative for  fever.  Respiratory:  Negative for shortness of breath.   Cardiovascular:  Positive for chest pain (substernall, heavy, was every morning until he went to urgent care. Took a course of doxycycline and sx resolved.).  Musculoskeletal:  Negative for arthralgias.  Skin:  Negative for rash.    Objective:  BP 132/70   Pulse 68   Temp 98 F (36.7 C)   Ht 5\' 9"  (1.753 m)   Wt 172 lb (78 kg)   SpO2 96%    BMI 25.40 kg/m   BP Readings from Last 3 Encounters:  05/10/23 132/70  02/02/23 (!) 149/78  10/15/22 132/84    Wt Readings from Last 3 Encounters:  05/10/23 172 lb (78 kg)  03/24/23 180 lb (81.6 kg)  02/02/23 174 lb 9.6 oz (79.2 kg)     Physical Exam Vitals reviewed.  Constitutional:      Appearance: He is well-developed.  HENT:     Head: Normocephalic and atraumatic.     Right Ear: External ear normal.     Left Ear: External ear normal.     Mouth/Throat:     Pharynx: No oropharyngeal exudate or posterior oropharyngeal erythema.  Eyes:     Pupils: Pupils are equal, round, and reactive to light.  Cardiovascular:     Rate and Rhythm: Normal rate and regular rhythm.     Heart sounds: No murmur heard. Pulmonary:     Effort: No respiratory distress.     Breath sounds: Normal breath sounds.  Musculoskeletal:     Cervical back: Normal range of motion and neck supple.  Neurological:     Mental Status: He is alert and oriented to person, place, and time.     Diabetic Foot Exam - Simple   No data filed     Lab Results  Component Value Date   HGBA1C 6.2 (H) 02/02/2023   HGBA1C 8.2 (H) 10/15/2022   HGBA1C 9.9 (H) 07/07/2022    Assessment & Plan:   Amilcar was seen today for medical management of chronic issues.  Diagnoses and all orders for this visit:  Mixed hyperlipidemia -     Lipid panel  Benign essential HTN -     CBC with Differential/Platelet -     CMP14+EGFR -     metoprolol tartrate (LOPRESSOR) 25 MG tablet; Take 1 tablet (25 mg total) by mouth daily. -     valsartan (DIOVAN) 320 MG tablet; Take 1 tablet (320 mg total) by mouth daily.  Diabetes mellitus without complication (HCC) -     Bayer DCA Hb A1c Waived -     CBC with Differential/Platelet  Vitamin B12 deficiency -     Vitamin B12  Vitamin D deficiency -     VITAMIN D 25 Hydroxy (Vit-D Deficiency, Fractures)  Chest pain, unspecified type -     Ambulatory referral to Cardiology  Other  orders -     metFORMIN (GLUCOPHAGE-XR) 750 MG 24 hr tablet; Take 1 tablet (750 mg total) by mouth 2 (two) times daily with a meal. -     omeprazole (PRILOSEC) 40 MG capsule; Take 1 capsule (40 mg total) by mouth daily.   I have changed Darshawn Folz's omeprazole. I am also having him maintain his aspirin, folic acid, ibuprofen, Fish Oil, ketoconazole, DentaGel, albuterol, OneTouch Ultra, ONE TOUCH ULTRA 2, fluticasone-salmeterol, fexofenadine-pseudoephedrine, OneTouch Delica Plus Lancet33G, fluticasone, cyanocobalamin, montelukast, metFORMIN, metoprolol tartrate, and valsartan.  Meds ordered this encounter  Medications   metFORMIN (GLUCOPHAGE-XR) 750 MG 24 hr tablet  Sig: Take 1 tablet (750 mg total) by mouth 2 (two) times daily with a meal.    Dispense:  180 tablet    Refill:  0   metoprolol tartrate (LOPRESSOR) 25 MG tablet    Sig: Take 1 tablet (25 mg total) by mouth daily.    Dispense:  90 tablet    Refill:  3   omeprazole (PRILOSEC) 40 MG capsule    Sig: Take 1 capsule (40 mg total) by mouth daily.    Dispense:  90 capsule    Refill:  3   valsartan (DIOVAN) 320 MG tablet    Sig: Take 1 tablet (320 mg total) by mouth daily.    Dispense:  90 tablet    Refill:  3     Follow-up: Return in about 3 months (around 08/08/2023).  Mechele Claude, M.D.

## 2023-05-11 LAB — CMP14+EGFR
ALT: 50 IU/L — ABNORMAL HIGH (ref 0–44)
AST: 45 IU/L — ABNORMAL HIGH (ref 0–40)
Albumin: 4.6 g/dL (ref 3.8–4.8)
Alkaline Phosphatase: 102 IU/L (ref 44–121)
BUN/Creatinine Ratio: 19 (ref 10–24)
BUN: 15 mg/dL (ref 8–27)
Bilirubin Total: 0.6 mg/dL (ref 0.0–1.2)
CO2: 26 mmol/L (ref 20–29)
Calcium: 9.5 mg/dL (ref 8.6–10.2)
Chloride: 98 mmol/L (ref 96–106)
Creatinine, Ser: 0.79 mg/dL (ref 0.76–1.27)
Globulin, Total: 2.5 g/dL (ref 1.5–4.5)
Glucose: 122 mg/dL — ABNORMAL HIGH (ref 70–99)
Potassium: 4.5 mmol/L (ref 3.5–5.2)
Sodium: 139 mmol/L (ref 134–144)
Total Protein: 7.1 g/dL (ref 6.0–8.5)
eGFR: 94 mL/min/{1.73_m2} (ref >59)

## 2023-05-11 LAB — VITAMIN B12: Vitamin B-12: 1101 pg/mL (ref 232–1245)

## 2023-05-11 LAB — LIPID PANEL
Cholesterol, Total: 182 mg/dL (ref 100–199)
HDL: 46 mg/dL (ref >39)
LDL CALC COMMENT:: 4 ratio (ref 0.0–5.0)
LDL Chol Calc (NIH): 110 mg/dL — ABNORMAL HIGH (ref 0–99)
Triglycerides: 144 mg/dL (ref 0–149)
VLDL Cholesterol Cal: 26 mg/dL (ref 5–40)

## 2023-05-11 LAB — CBC WITH DIFFERENTIAL/PLATELET
Basophils Absolute: 0.1 10*3/uL (ref 0.0–0.2)
Basos: 1 %
EOS (ABSOLUTE): 0.2 10*3/uL (ref 0.0–0.4)
Eos: 4 %
Hematocrit: 44.9 % (ref 37.5–51.0)
Hemoglobin: 15.1 g/dL (ref 13.0–17.7)
Immature Grans (Abs): 0 10*3/uL (ref 0.0–0.1)
Immature Granulocytes: 0 %
Lymphocytes Absolute: 1.7 10*3/uL (ref 0.7–3.1)
Lymphs: 28 %
MCH: 32.3 pg (ref 26.6–33.0)
MCHC: 33.6 g/dL (ref 31.5–35.7)
MCV: 96 fL (ref 79–97)
Monocytes Absolute: 0.5 10*3/uL (ref 0.1–0.9)
Monocytes: 8 %
Neutrophils Absolute: 3.6 10*3/uL (ref 1.4–7.0)
Neutrophils: 59 %
Platelets: 205 10*3/uL (ref 150–450)
RBC: 4.67 x10E6/uL (ref 4.14–5.80)
RDW: 11.8 % (ref 11.6–15.4)
WBC: 6.1 10*3/uL (ref 3.4–10.8)

## 2023-05-11 LAB — VITAMIN D 25 HYDROXY (VIT D DEFICIENCY, FRACTURES): Vit D, 25-Hydroxy: 73.8 ng/mL (ref 30.0–100.0)

## 2023-05-12 DIAGNOSIS — R269 Unspecified abnormalities of gait and mobility: Secondary | ICD-10-CM | POA: Diagnosis not present

## 2023-05-13 ENCOUNTER — Other Ambulatory Visit: Payer: Self-pay | Admitting: Family Medicine

## 2023-05-13 DIAGNOSIS — R062 Wheezing: Secondary | ICD-10-CM

## 2023-05-13 DIAGNOSIS — R059 Cough, unspecified: Secondary | ICD-10-CM

## 2023-05-13 DIAGNOSIS — J069 Acute upper respiratory infection, unspecified: Secondary | ICD-10-CM

## 2023-05-18 DIAGNOSIS — R269 Unspecified abnormalities of gait and mobility: Secondary | ICD-10-CM | POA: Diagnosis not present

## 2023-05-20 DIAGNOSIS — R269 Unspecified abnormalities of gait and mobility: Secondary | ICD-10-CM | POA: Diagnosis not present

## 2023-05-25 DIAGNOSIS — R269 Unspecified abnormalities of gait and mobility: Secondary | ICD-10-CM | POA: Diagnosis not present

## 2023-05-27 DIAGNOSIS — R269 Unspecified abnormalities of gait and mobility: Secondary | ICD-10-CM | POA: Diagnosis not present

## 2023-06-01 ENCOUNTER — Other Ambulatory Visit: Payer: Self-pay | Admitting: Family Medicine

## 2023-06-01 DIAGNOSIS — E538 Deficiency of other specified B group vitamins: Secondary | ICD-10-CM

## 2023-06-08 DIAGNOSIS — R269 Unspecified abnormalities of gait and mobility: Secondary | ICD-10-CM | POA: Diagnosis not present

## 2023-06-10 DIAGNOSIS — R269 Unspecified abnormalities of gait and mobility: Secondary | ICD-10-CM | POA: Diagnosis not present

## 2023-06-15 DIAGNOSIS — R269 Unspecified abnormalities of gait and mobility: Secondary | ICD-10-CM | POA: Diagnosis not present

## 2023-06-16 ENCOUNTER — Ambulatory Visit: Payer: Medicare HMO

## 2023-06-16 VITALS — Ht 69.0 in | Wt 172.0 lb

## 2023-06-16 DIAGNOSIS — Z0000 Encounter for general adult medical examination without abnormal findings: Secondary | ICD-10-CM | POA: Diagnosis not present

## 2023-06-16 NOTE — Patient Instructions (Signed)
 Jimmy Park , Thank you for taking time to come for your Medicare Wellness Visit. I appreciate your ongoing commitment to your health goals. Please review the following plan we discussed and let me know if I can assist you in the future.   Referrals/Orders/Follow-Ups/Clinician Recommendations: Aim for 30 minutes of exercise or brisk walking, 6-8 glasses of water, and 5 servings of fruits and vegetables each day.  This is a list of the screening recommended for you and due dates:  Health Maintenance  Topic Date Due   Complete foot exam   Never done   Yearly kidney health urinalysis for diabetes  Never done   Hemoglobin A1C  11/08/2023   Eye exam for diabetics  03/10/2024   Colon Cancer Screening  05/08/2024   Yearly kidney function blood test for diabetes  05/09/2024   Medicare Annual Wellness Visit  06/15/2024   DTaP/Tdap/Td vaccine (4 - Td or Tdap) 08/08/2028   Pneumonia Vaccine  Completed   Flu Shot  Completed   COVID-19 Vaccine  Completed   Hepatitis C Screening  Completed   Zoster (Shingles) Vaccine  Completed   HPV Vaccine  Aged Out    Advanced directives: (ACP Link)Information on Advanced Care Planning can be found at [place]  Secretary of State Advance Health Care Directives Advance Health Care Directives ([email or web address]   Next Medicare Annual Wellness Visit scheduled for next year: Yes

## 2023-06-16 NOTE — Progress Notes (Signed)
 Subjective:   Jimmy Park is a 73 y.o. male who presents for Medicare Annual/Subsequent preventive examination.  Visit Complete: Virtual I connected with  Jimmy Park on 06/16/23 by a audio enabled telemedicine application and verified that I am speaking with the correct person using two identifiers.  Patient Location: Home  Provider Location: Office/Clinic  I discussed the limitations of evaluation and management by telemedicine. The patient expressed understanding and agreed to proceed.  Vital Signs: Because this visit was a virtual/telehealth visit, some criteria may be missing or patient reported. Any vitals not documented were not able to be obtained and vitals that have been documented are patient reported.  Cardiac Risk Factors include: advanced age (>71men, >39 women);male gender;hypertension;dyslipidemia;diabetes mellitus     Objective:    Today's Vitals   06/16/23 0925  Weight: 172 lb (78 kg)  Height: 5' 9 (1.753 m)   Body mass index is 25.4 kg/m.     06/16/2023    9:30 AM 06/10/2022   10:43 AM 02/24/2021   10:15 AM 02/23/2020   10:19 AM 02/09/2019   10:33 AM 01/09/2015    9:54 AM  Advanced Directives  Does Patient Have a Medical Advance Directive? No Yes Yes Yes Yes Yes  Type of Advance Directive  Living will;Healthcare Power of [employer] Power of [place];Living will Healthcare Power of [place];Living will Healthcare Power of [place];Living will   Does patient want to make changes to medical advance directive?  No - Patient declined  No - Patient declined No - Patient declined   Copy of Healthcare Power of Attorney in Chart?  No - copy requested No - copy requested  No - copy requested   Would patient like information on creating a medical advance directive? Yes (MAU/Ambulatory/Procedural Areas - Information given)         Current Medications (verified) Outpatient Encounter Medications as of 06/16/2023  Medication Sig   albuterol  (VENTOLIN  HFA) 108 (90  Base) MCG/ACT inhaler TAKE 2 PUFFS BY MOUTH EVERY 6 HOURS AS NEEDED FOR WHEEZE OR SHORTNESS OF BREATH   aspirin 81 MG tablet Take 1 tablet by mouth daily.   Blood Glucose Monitoring Suppl (ONE TOUCH ULTRA 2) w/Device KIT CHECK BLOOD SUGAR EVERY MORNING AS DIRECTED   cyanocobalamin  (VITAMIN B12) 1000 MCG/ML injection INJECT 1 ML SUBCUTANEOUSLY EVERY 30 DAYS   DENTAGEL 1.1 % GEL dental gel SMARTSIG:Application To Teeth   fexofenadine -pseudoephedrine (ALLEGRA -D 24) 180-240 MG 24 hr tablet Take 1 tablet by mouth daily. For allergy and congestion   fluticasone  (FLONASE ) 50 MCG/ACT nasal spray SPRAY 2 SPRAYS INTO EACH NOSTRIL EVERY DAY   fluticasone -salmeterol (ADVAIR DISKUS) 250-50 MCG/ACT AEPB TAKE 1 PUFF BY MOUTH TWICE A DAY   folic acid  (FOLVITE ) 1 MG tablet Take 1 tablet by mouth daily.   glucose blood (ONETOUCH ULTRA) test strip Check BS TID Dx R73.03   ibuprofen (ADVIL,MOTRIN) 400 MG tablet Take 1 tablet by mouth 2 (two) times daily as needed.   ketoconazole (NIZORAL) 2 % shampoo Apply topically.   Lancets (ONETOUCH DELICA PLUS LANCET33G) MISC Check BS TID Dx R73.03   metFORMIN  (GLUCOPHAGE -XR) 750 MG 24 hr tablet Take 1 tablet (750 mg total) by mouth 2 (two) times daily with a meal.   metoprolol  tartrate (LOPRESSOR ) 25 MG tablet Take 1 tablet (25 mg total) by mouth daily.   montelukast  (SINGULAIR ) 10 MG tablet TAKE 1 TABLET BY MOUTH EVERY DAY   Omega-3 Fatty Acids (FISH OIL) 1000 MG CAPS Take by mouth.   omeprazole  (  PRILOSEC) 40 MG capsule Take 1 capsule (40 mg total) by mouth daily.   valsartan  (DIOVAN ) 320 MG tablet Take 1 tablet (320 mg total) by mouth daily.   No facility-administered encounter medications on file as of 06/16/2023.    Allergies (verified) Patient has no known allergies.   History: Past Medical History:  Diagnosis Date   Allergy    Anemia    Asthma    B12 deficiency    Cerebral ventriculomegaly    Gait abnormality    Hypertension    Increased intracranial  pressure 02/12/2012   Formatting of this note might be different from the original.  Per telephone call from Dr.Dillon Dilaldigala [phone] MRI     Pneumonia of left lung due to infectious organism 08/21/2020   URI with cough and congestion 08/21/2020   Past Surgical History:  Procedure Laterality Date   LUMBAR DRAIN IMPLANTATION     Family History  Problem Relation Age of Onset   Diabetes Mother    Hypertension Mother    Diabetes Father    Hypertension Father    Asthma Brother    Social History   Socioeconomic History   Marital status: Married    Spouse name: [name]   Number of children: 4   Years of education: 12   Highest education level: High school graduate  Occupational History   Occupation: retired  Tobacco Use   Smoking status: Former    Current packs/day: 0.00    Average packs/day: 0.5 packs/day for 6.0 years (3.0 ttl pk-yrs)    Types: Cigarettes    Start date: 06/09/1967    Quit date: 06/08/1973    Years since quitting: 50.0   Smokeless tobacco: Never  Vaping Use   Vaping status: Never Used  Substance and Sexual Activity   Alcohol use: Not Currently    Alcohol/week: 0.0 standard drinks of alcohol   Drug use: No   Sexual activity: Not Currently  Other Topics Concern   Not on file  Social History Narrative   Lives home with wife and her son   Social Drivers of Corporate Investment Banker Strain: Low Risk  (06/16/2023)   Overall Financial Resource Strain (CARDIA)    Difficulty of Paying Living Expenses: Not hard at all  Food Insecurity: No Food Insecurity (06/16/2023)   Hunger Vital Sign    Worried About Running Out of Food in the Last Year: Never true    Ran Out of Food in the Last Year: Never true  Transportation Needs: No Transportation Needs (06/16/2023)   PRAPARE - Administrator, Civil Service (Medical): No    Lack of Transportation (Non-Medical): No  Physical Activity: Inactive (06/16/2023)   Exercise Vital Sign    Days of Exercise per  Week: 0 days    Minutes of Exercise per Session: 0 min  Stress: No Stress Concern Present (06/16/2023)   [employer] of Occupational Health - Occupational Stress Questionnaire    Feeling of Stress : Not at all  Social Connections: Moderately Integrated (06/16/2023)   Social Connection and Isolation Panel [NHANES]    Frequency of Communication with Friends and Family: More than three times a week    Frequency of Social Gatherings with Friends and Family: Three times a week    Attends Religious Services: 1 to 4 times per year    Active Member of Clubs or Organizations: No    Attends Banker Meetings: Never    Marital Status: Married  Tobacco Counseling Counseling given: Not Answered   Clinical Intake:  Pre-visit preparation completed: Yes  Pain : No/denies pain     Diabetes: Yes CBG done?: No Did pt. bring in CBG monitor from home?: No  How often do you need to have someone help you when you read instructions, pamphlets, or other written materials from your doctor or pharmacy?: 1 - Never  Interpreter Needed?: No  Information entered by :: Charmaine Bloodgood LPN   Activities of Daily Living    06/16/2023    9:27 AM  In your present state of health, do you have any difficulty performing the following activities:  Hearing? 0  Vision? 0  Difficulty concentrating or making decisions? 1  Comment slight impairment  Walking or climbing stairs? 0  Dressing or bathing? 0  Doing errands, shopping? 0  Preparing Food and eating ? N  Using the Toilet? N  In the past six months, have you accidently leaked urine? N  Do you have problems with loss of bowel control? N  Managing your Medications? N  Managing your Finances? N  Housekeeping or managing your Housekeeping? N    Patient Care Team: Zollie Lowers, MD as PCP - General (Family Medicine) Eda Baxter, MD as Referring Physician (Dermatology) Dr [facility], OD (Optometry) Rosalita  Dayton FALCON, MD as Consulting Physician (Neurosurgery)  Indicate any recent Medical Services you may have received from other than [facility] providers in the past year (date may be approximate).     Assessment:   This is a routine wellness examination for Ante.  Hearing/Vision screen Hearing Screening - Comments:: Denies hearing difficulties   Vision Screening - Comments:: Wears rx glasses - up to date with routine eye exams with Dr. Willma Moats     Goals Addressed             This Visit's Progress    Remain active and independent        Depression Screen    06/16/2023    9:29 AM 05/10/2023    9:55 AM 05/10/2023    9:40 AM 02/02/2023   10:44 AM 02/02/2023   10:29 AM 10/15/2022   12:13 PM 10/15/2022   12:01 PM  PHQ 2/9 Scores  PHQ - 2 Score 1 1 0 2 0 0 0  PHQ- 9 Score 2 2  5        Fall Risk    06/16/2023    9:31 AM 05/10/2023    9:55 AM 05/10/2023    9:40 AM 02/02/2023   10:42 AM 02/02/2023   10:29 AM  Fall Risk   Falls in the past year? 1 1 0 1 0  Number falls in past yr: 0 0  1   Injury with Fall? 0 0  0   Risk for fall due to : Impaired balance/gait;History of fall(s) History of fall(s);Impaired balance/gait  History of fall(s)   Follow up Falls prevention discussed;Education provided;Falls evaluation completed Falls evaluation completed  Falls evaluation completed     MEDICARE RISK AT HOME: Medicare Risk at Home Any stairs in or around the home?: No If so, are there any without handrails?: No Home free of loose throw rugs in walkways, pet beds, electrical cords, etc?: Yes Adequate lighting in your home to reduce risk of falls?: Yes Life alert?: No Use of a cane, walker or w/c?: No Grab bars in the bathroom?: Yes Shower chair or bench in shower?: No Elevated toilet seat or a handicapped toilet?: Yes  TIMED  UP AND GO:  Was the test performed?  No    Cognitive Function:        06/16/2023    9:31 AM 06/10/2022   10:47 AM 02/23/2020   10:25 AM 02/23/2020   10:21 AM  02/09/2019   10:40 AM  6CIT Screen  What Year? 0 points 0 points  0 points 0 points  What month? 0 points 0 points  0 points 0 points  What time? 0 points 0 points  0 points 0 points  Count back from 20 0 points 0 points  0 points 0 points  Months in reverse 2 points 0 points  0 points 0 points  Repeat phrase 2 points 2 points 2 points 0 points 4 points  Total Score 4 points 2 points  0 points 4 points    Immunizations Immunization History  Administered Date(s) Administered   DTaP 09/18/2007   Fluad Quad(high Dose 65+) 03/08/2019, 03/22/2020, 03/10/2021, 04/01/2022   Fluad Trivalent(High Dose 65+) 03/02/2023   Influenza Whole 05/14/2010, 07/01/2011, 02/15/2012, 03/08/2013, 02/26/2014   Influenza, High Dose Seasonal PF 03/17/2018   Influenza,inj,Quad PF,6+ Mos 03/27/2015, 02/26/2016   Influenza-Unspecified 03/17/2017   Moderna Covid-19 Vaccine Bivalent Booster 35yrs & up 05/07/2021   Moderna SARS-COV2 Booster Vaccination 01/08/2021   Moderna Sars-Covid-2 Vaccination 08/02/2019, 08/30/2019, 04/24/2020   Pfizer(Comirnaty)Fall Seasonal Vaccine 12 years and older 03/19/2022, 03/02/2023   Pneumococcal Conjugate-13 08/06/2017   Pneumococcal Polysaccharide-23 08/09/2018   Pneumococcal-Unspecified 02/15/2012   Respiratory Syncytial Virus Vaccine,Recomb Aduvanted(Arexvy) 04/09/2022   Td 08/09/2018   Tdap 09/18/2007   Zoster Recombinant(Shingrix) 04/10/2021, 06/13/2021    TDAP status: Up to date  Flu Vaccine status: Up to date  Pneumococcal vaccine status: Up to date  Covid-19 vaccine status: Information provided on how to obtain vaccines.   Qualifies for Shingles Vaccine? Yes   Zostavax completed No   Shingrix Completed?: Yes  Screening Tests Health Maintenance  Topic Date Due   FOOT EXAM  Never done   Diabetic kidney evaluation - Urine ACR  Never done   HEMOGLOBIN A1C  11/08/2023   OPHTHALMOLOGY EXAM  03/10/2024   Colonoscopy  05/08/2024   Diabetic kidney evaluation - eGFR  measurement  05/09/2024   Medicare Annual Wellness (AWV)  06/15/2024   DTaP/Tdap/Td (4 - Td or Tdap) 08/08/2028   Pneumonia Vaccine 14+ Years old  Completed   INFLUENZA VACCINE  Completed   COVID-19 Vaccine  Completed   Hepatitis C Screening  Completed   Zoster Vaccines- Shingrix  Completed   HPV VACCINES  Aged Out    Health Maintenance  Health Maintenance Due  Topic Date Due   FOOT EXAM  Never done   Diabetic kidney evaluation - Urine ACR  Never done    Colorectal cancer screening: Type of screening: Colonoscopy. Completed 05/08/14. Repeat every 10 years  Lung Cancer Screening: (Low Dose CT Chest recommended if Age 60-80 years, 20 pack-year currently smoking OR have quit w/in 15years.) does not qualify.   Lung Cancer Screening Referral: n/a  Additional Screening:  Hepatitis C Screening: does qualify; Completed 04/10/20  Vision Screening: Recommended annual ophthalmology exams for early detection of glaucoma and other disorders of the eye. Is the patient up to date with their annual eye exam?  Yes  Who is the provider or what is the name of the office in which the patient attends annual eye exams? Dr. Willma Moats  If pt is not established with a provider, would they like to be referred to a provider to  establish care? No .   Dental Screening: Recommended annual dental exams for proper oral hygiene  Diabetic Foot Exam: Diabetic Foot Exam: Overdue, Pt has been advised about the importance in completing this exam. Pt is scheduled for diabetic foot exam on at next office visit.  Community Resource Referral / Chronic Care Management: CRR required this visit?  No   CCM required this visit?  No     Plan:     I have personally reviewed and noted the following in the patient's chart:   Medical and social history Use of alcohol, tobacco or illicit drugs  Current medications and supplements including opioid prescriptions. Patient is not currently taking opioid  prescriptions. Functional ability and status Nutritional status Physical activity Advanced directives List of other physicians Hospitalizations, surgeries, and ER visits in previous 12 months Vitals Screenings to include cognitive, depression, and falls Referrals and appointments  In addition, I have reviewed and discussed with patient certain preventive protocols, quality metrics, and best practice recommendations. A written personalized care plan for preventive services as well as general preventive health recommendations were provided to patient.     Lavelle Pfeiffer [place], [place]   01/06/7973   After Visit Summary: (MyChart) Due to this being a telephonic visit, the after visit summary with patients personalized plan was offered to patient via MyChart   Nurse Notes: No concerns at this time

## 2023-06-17 ENCOUNTER — Ambulatory Visit: Payer: Medicare HMO | Admitting: Internal Medicine

## 2023-07-27 DIAGNOSIS — H401221 Low-tension glaucoma, left eye, mild stage: Secondary | ICD-10-CM | POA: Diagnosis not present

## 2023-08-09 ENCOUNTER — Encounter: Payer: Self-pay | Admitting: Family Medicine

## 2023-08-09 ENCOUNTER — Ambulatory Visit (INDEPENDENT_AMBULATORY_CARE_PROVIDER_SITE_OTHER): Payer: Medicare HMO | Admitting: Family Medicine

## 2023-08-09 VITALS — BP 132/78 | HR 61 | Temp 97.6°F | Ht 69.0 in | Wt 172.0 lb

## 2023-08-09 DIAGNOSIS — M21612 Bunion of left foot: Secondary | ICD-10-CM | POA: Diagnosis not present

## 2023-08-09 DIAGNOSIS — E782 Mixed hyperlipidemia: Secondary | ICD-10-CM | POA: Diagnosis not present

## 2023-08-09 DIAGNOSIS — Z7984 Long term (current) use of oral hypoglycemic drugs: Secondary | ICD-10-CM

## 2023-08-09 DIAGNOSIS — I10 Essential (primary) hypertension: Secondary | ICD-10-CM | POA: Diagnosis not present

## 2023-08-09 DIAGNOSIS — E1142 Type 2 diabetes mellitus with diabetic polyneuropathy: Secondary | ICD-10-CM | POA: Diagnosis not present

## 2023-08-09 DIAGNOSIS — E118 Type 2 diabetes mellitus with unspecified complications: Secondary | ICD-10-CM | POA: Diagnosis not present

## 2023-08-09 DIAGNOSIS — E756 Lipid storage disorder, unspecified: Secondary | ICD-10-CM | POA: Diagnosis not present

## 2023-08-09 DIAGNOSIS — M21611 Bunion of right foot: Secondary | ICD-10-CM

## 2023-08-09 DIAGNOSIS — E1169 Type 2 diabetes mellitus with other specified complication: Secondary | ICD-10-CM

## 2023-08-09 LAB — BAYER DCA HB A1C WAIVED: HB A1C (BAYER DCA - WAIVED): 5.5 % (ref 4.8–5.6)

## 2023-08-09 MED ORDER — PROMETHAZINE-DM 6.25-15 MG/5ML PO SYRP: 5.0000 mL | ORAL_SOLUTION | Freq: Four times a day (QID) | ORAL | 0 refills | Status: DC | PRN

## 2023-08-09 NOTE — Progress Notes (Signed)
 Subjective:  Patient ID: Jimmy Park,  male    DOB: [birth year]  Age: 73 y.o.    CC: Diabetes, Cough (Coughing at night due to allergies and asthma. Taking cough syrup and allergy meds. ), and physical therapy (Taking a break currently. )   HPI Jimmy Park presents for  follow-up of hypertension. Patient has no history of headache chest pain or shortness of breath or recent cough. Patient also denies symptoms of TIA such as numbness weakness lateralizing. Patient denies side effects from medication. States taking it regularly.  Patient also  in for follow-up of elevated cholesterol. Doing well without complaints on current medication. Denies side effects  including myalgia and arthralgia and nausea. Also in today for liver function testing. Currently no chest pain, shortness of breath or other cardiovascular related symptoms noted.  Follow-up of diabetes. Patient does check blood sugar at home. Readings run between 130 and 200 Patient denies symptoms such as excessive hunger or urinary frequency, excessive hunger, nausea No significant hypoglycemic spells noted. Medications reviewed. Pt reports taking them regularly. Pt. denies complication/adverse reaction today.   Patient in for follow-up of GERD. Currently asymptomatic taking  PPI daily. There is no chest pain or heartburn. No hematemesis and no melena. No dysphagia or choking. Onset is remote. Progression is stable. Complicating factors, none.    History Jimmy Park has a past medical history of Allergy, Anemia, Asthma, B12 deficiency, Cerebral ventriculomegaly, Gait abnormality, Hypertension, 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   He has a past surgical history that includes Lumbar drain implantation.   His family history includes Asthma in his brother; Diabetes in his father and mother; Hypertension in his father and  mother.He reports that he quit smoking about 50 years ago. His smoking use included cigarettes. He started smoking about 56 years ago. He has a 3 pack-year smoking history. He has never used smokeless tobacco. He reports that he does not currently use alcohol. He reports that he does not use drugs.  Current Outpatient Medications on File Prior to Visit  Medication Sig Dispense Refill   albuterol (VENTOLIN HFA) 108 (90 Base) MCG/ACT inhaler TAKE 2 PUFFS BY MOUTH EVERY 6 HOURS AS NEEDED FOR WHEEZE OR SHORTNESS OF BREATH 18 each 5   aspirin 81 MG tablet Take 1 tablet by mouth daily.     Blood Glucose Monitoring Suppl (ONE TOUCH ULTRA 2) w/Device KIT CHECK BLOOD SUGAR EVERY MORNING AS DIRECTED 1 kit 0   cyanocobalamin (VITAMIN B12) 1000 MCG/ML injection INJECT 1 ML SUBCUTANEOUSLY EVERY 30 DAYS 3 mL 0   DENTAGEL 1.1 % GEL dental gel SMARTSIG:Application To Teeth     fexofenadine-pseudoephedrine (ALLEGRA-D 24) 180-240 MG 24 hr tablet Take 1 tablet by mouth daily. For allergy and congestion 30 tablet 11   fluticasone (FLONASE) 50 MCG/ACT nasal spray SPRAY 2 SPRAYS INTO EACH NOSTRIL EVERY DAY 48 mL 3   fluticasone-salmeterol (ADVAIR DISKUS) 250-50 MCG/ACT AEPB TAKE 1 PUFF BY MOUTH TWICE A DAY 60 each 2   folic acid (FOLVITE) 1 MG tablet Take 1 tablet by mouth daily.     glucose blood (ONETOUCH ULTRA) test strip Check BS TID Dx R73.03 300 strip 3   ibuprofen (ADVIL,MOTRIN) 400 MG tablet Take 1 tablet by mouth 2 (two) times daily as needed.     ketoconazole (NIZORAL) 2 % shampoo Apply topically.     Lancets (ONETOUCH DELICA PLUS LANCET33G) MISC Check BS TID  Dx R73.03 300 each 3   metFORMIN (GLUCOPHAGE-XR) 750 MG 24 hr tablet Take 1 tablet (750 mg total) by mouth 2 (two) times daily with a meal. 180 tablet 0   metoprolol tartrate (LOPRESSOR) 25 MG tablet Take 1 tablet (25 mg total) by mouth daily. 90 tablet 3   montelukast (SINGULAIR) 10 MG tablet TAKE 1 TABLET BY MOUTH EVERY DAY 90 tablet 1   Omega-3 Fatty  Acids (FISH OIL) 1000 MG CAPS Take by mouth.     omeprazole (PRILOSEC) 40 MG capsule Take 1 capsule (40 mg total) by mouth daily. 90 capsule 3   valsartan (DIOVAN) 320 MG tablet Take 1 tablet (320 mg total) by mouth daily. 90 tablet 3   No current facility-administered medications on file prior to visit.    ROS Review of Systems  Constitutional:  Negative for fever.  Respiratory:  Negative for shortness of breath.   Cardiovascular:  Negative for chest pain.  Musculoskeletal:  Positive for gait problem. Negative for arthralgias.  Skin:  Negative for rash.    Objective:  BP 132/78   Pulse 61   Temp 97.6 F (36.4 C)   Ht 5\' 9"  (1.753 m)   Wt 172 lb (78 kg)   SpO2 95%   BMI 25.40 kg/m   BP Readings from Last 3 Encounters:  08/09/23 132/78  05/10/23 132/70  02/02/23 (!) 149/78    Wt Readings from Last 3 Encounters:  08/09/23 172 lb (78 kg)  06/16/23 172 lb (78 kg)  05/10/23 172 lb (78 kg)     Physical Exam Vitals reviewed.  Constitutional:      Appearance: He is well-developed.  HENT:     Head: Normocephalic and atraumatic.     Right Ear: External ear normal.     Left Ear: External ear normal.     Mouth/Throat:     Pharynx: No oropharyngeal exudate or posterior oropharyngeal erythema.  Eyes:     Pupils: Pupils are equal, round, and reactive to light.  Cardiovascular:     Rate and Rhythm: Normal rate and regular rhythm.     Heart sounds: No murmur heard. Pulmonary:     Effort: No respiratory distress.     Breath sounds: Normal breath sounds.  Musculoskeletal:     Cervical back: Normal range of motion and neck supple.  Neurological:     Mental Status: He is alert and oriented to person, place, and time.     Title   Diabetic Foot Exam - detailed Date & Time: 08/09/2023 11:29 AM Diabetic Foot exam was performed with the following findings: Yes  Is there a history of foot ulcer?: No Is there a foot ulcer now?: No Is there swelling?: No Is there elevated skin  temperature?: No Is there abnormal foot shape?: Yes Is there a claw toe deformity?: No Are the toenails long?: No Are the toenails thick?: No Are the toenails ingrown?: No Is the skin thin, fragile, shiny and hairless?": Yes Normal Range of Motion?: No Is there foot or ankle muscle weakness?: No Do you have pain in calf while walking?: No Are the shoes appropriate in style and fit?: No Can the patient see the bottom of their feet?: Yes Pulse Foot Exam completed.: Yes   Right Posterior Tibialis: Present Left posterior Tibialis: Present   Right Dorsalis Pedis: Diminished Left Dorsalis Pedis: Present     Sensory Foot Exam Completed.: Yes Semmes-Weinstein Monofilament Test "+" means "has sensation" and "-" means "no sensation"  R Foot  Test Control: Pos L Foot Test Control: Pos   R Site 1-Great Toe: Pos L Site 1-Great Toe: Pos   R Site 4: Neg L Site 4: Neg   R site 5: Neg L Site 5: Pos  R Site 6: Neg L Site 6: Pos     Image components are not supported.   Image components are not supported. Image components are not supported.  Tuning Fork Right vibratory: diminished Left vibratory: absent  Comments Bunions bilaterally        Lab Results  Component Value Date   HGBA1C 6.3 (H) 05/10/2023   HGBA1C 6.2 (H) 02/02/2023   HGBA1C 8.2 (H) 10/15/2022    Assessment & Plan:   Jimmy Park was seen today for diabetes, cough and physical therapy.  Diagnoses and all orders for this visit:  Benign essential HTN -     CBC with Differential/Platelet -     CMP14+EGFR  Mixed hyperlipidemia -     Lipid panel  Diabetic lipidosis (HCC) -     Bayer DCA Hb A1c Waived -     For Home Use Only DME Diabetic Shoe  Controlled type 2 diabetes mellitus with diabetic polyneuropathy, without long-term current use of insulin (HCC) -     Bayer DCA Hb A1c Waived  Bunion of left foot -     For Home Use Only DME Diabetic Shoe  Bunion of right foot -     For Home Use Only DME Diabetic  Shoe   I am having Jimmy Park maintain his aspirin, folic acid, ibuprofen, Fish Oil, ketoconazole, DentaGel, OneTouch Ultra, ONE TOUCH ULTRA 2, fluticasone-salmeterol, fexofenadine-pseudoephedrine, OneTouch Delica Plus Lancet33G, fluticasone, montelukast, metFORMIN, metoprolol tartrate, omeprazole, valsartan, albuterol, and cyanocobalamin.  No orders of the defined types were placed in this encounter.    Follow-up: Return in about 3 months (around 11/09/2023).  Mechele Claude, M.D.

## 2023-08-10 LAB — CBC WITH DIFFERENTIAL/PLATELET
Basophils Absolute: 0.1 10*3/uL (ref 0.0–0.2)
Basos: 1 %
EOS (ABSOLUTE): 0.3 10*3/uL (ref 0.0–0.4)
Eos: 4 %
Hematocrit: 45.5 % (ref 37.5–51.0)
Hemoglobin: 14.9 g/dL (ref 13.0–17.7)
Immature Grans (Abs): 0 10*3/uL (ref 0.0–0.1)
Immature Granulocytes: 0 %
Lymphocytes Absolute: 1.8 10*3/uL (ref 0.7–3.1)
Lymphs: 28 %
MCH: 31.4 pg (ref 26.6–33.0)
MCHC: 32.7 g/dL (ref 31.5–35.7)
MCV: 96 fL (ref 79–97)
Monocytes Absolute: 0.5 10*3/uL (ref 0.1–0.9)
Monocytes: 8 %
Neutrophils Absolute: 3.7 10*3/uL (ref 1.4–7.0)
Neutrophils: 59 %
Platelets: 246 10*3/uL (ref 150–450)
RBC: 4.74 x10E6/uL (ref 4.14–5.80)
RDW: 11.9 % (ref 11.6–15.4)
WBC: 6.3 10*3/uL (ref 3.4–10.8)

## 2023-08-10 LAB — CMP14+EGFR
ALT: 54 IU/L — ABNORMAL HIGH (ref 0–44)
AST: 49 IU/L — ABNORMAL HIGH (ref 0–40)
Albumin: 4.6 g/dL (ref 3.8–4.8)
Alkaline Phosphatase: 108 IU/L (ref 44–121)
BUN/Creatinine Ratio: 12 (ref 10–24)
BUN: 11 mg/dL (ref 8–27)
Bilirubin Total: 0.6 mg/dL (ref 0.0–1.2)
CO2: 24 mmol/L (ref 20–29)
Calcium: 9.8 mg/dL (ref 8.6–10.2)
Chloride: 100 mmol/L (ref 96–106)
Creatinine, Ser: 0.93 mg/dL (ref 0.76–1.27)
Globulin, Total: 2.5 g/dL (ref 1.5–4.5)
Glucose: 120 mg/dL — ABNORMAL HIGH (ref 70–99)
Potassium: 4.6 mmol/L (ref 3.5–5.2)
Sodium: 140 mmol/L (ref 134–144)
Total Protein: 7.1 g/dL (ref 6.0–8.5)
eGFR: 87 mL/min/{1.73_m2} (ref >59)

## 2023-08-10 LAB — LIPID PANEL
Chol/HDL Ratio: 4 ratio (ref 0.0–5.0)
Cholesterol, Total: 166 mg/dL (ref 100–199)
HDL: 42 mg/dL (ref >39)
LDL Chol Calc (NIH): 100 mg/dL — ABNORMAL HIGH (ref 0–99)
Triglycerides: 133 mg/dL (ref 0–149)
VLDL Cholesterol Cal: 24 mg/dL (ref 5–40)

## 2023-08-11 ENCOUNTER — Encounter: Payer: Self-pay | Admitting: Family Medicine

## 2023-08-11 NOTE — Progress Notes (Signed)
Hello Juniper,  Your lab result is normal and/or stable.Some minor variations that are not significant are commonly marked abnormal, but do not represent any medical problem for you.  Best regards, Mechele Claude, M.D.

## 2023-08-19 ENCOUNTER — Telehealth: Payer: Self-pay

## 2023-08-19 ENCOUNTER — Other Ambulatory Visit: Payer: Self-pay | Admitting: Family Medicine

## 2023-08-19 DIAGNOSIS — J4520 Mild intermittent asthma, uncomplicated: Secondary | ICD-10-CM

## 2023-08-19 NOTE — Telephone Encounter (Signed)
 Please let the patient know that I sent their prescription to their pharmacy. Thanks, WS

## 2023-08-19 NOTE — Telephone Encounter (Signed)
 Two messages sent, Completing this one. Will address in first.

## 2023-08-19 NOTE — Telephone Encounter (Signed)
 Pt informed    LS

## 2023-08-19 NOTE — Telephone Encounter (Signed)
 Copied from CRM [id number]. Topic: Clinical - Prescription Issue >> Aug 19, 2023 12:31 PM DeAngela L wrote: Reason for CRM: Patient needs a new nebulizer cause older one has a crack and the medicine spills out and possibly needs new tubing, the motor is still good but not able to use the nebulizer with the crack on it, need a new one from [facility] and Patient is wheezing and wife says he needs this as soon as possible

## 2023-08-19 NOTE — Telephone Encounter (Signed)
 Copied from CRM [id number]. Topic: Clinical - Prescription Issue >> Aug 19, 2023 12:31 PM DeAngela L wrote: Reason for CRM: Patient needs a new nebulizer cause older one has a crack and the medicine spills out and possibly needs new tubing, the motor is still good but not able to use the nebulizer with the crack on it, need a new one from [facility] and Patient is wheezing and wife says he needs this as soon as possible >> Aug 19, 2023 12:43 PM DeAngela L wrote: [facility] address  [address] [place], [place], [place] [address] phone [phone] fax [phone]

## 2023-08-20 DIAGNOSIS — J4520 Mild intermittent asthma, uncomplicated: Secondary | ICD-10-CM | POA: Diagnosis not present

## 2023-08-20 NOTE — Addendum Note (Signed)
 Addended by: Arville Care on: 08/20/2023 12:25 PM   Modules accepted: Orders

## 2023-08-20 NOTE — Telephone Encounter (Signed)
 And looks like the order was sent yesterday by Dr. Darlyn Read but just in case they did not get it, I will go ahead and have another 1 sent over today

## 2023-08-20 NOTE — Telephone Encounter (Signed)
 Faxed Nebulizer Rx to [facility] at [phone]. Wife made aware.

## 2023-08-20 NOTE — Telephone Encounter (Signed)
 Patients wife, Tyler Aas is calling to report that she went to [facility] and they do not have the script for the nebulizer. Please advise

## 2023-08-21 ENCOUNTER — Other Ambulatory Visit: Payer: Self-pay | Admitting: Family Medicine

## 2023-08-21 MED ORDER — AMOXICILLIN-POT CLAVULANATE 875-125 MG PO TABS: 1.0000 | ORAL_TABLET | Freq: Two times a day (BID) | ORAL | 0 refills | Status: DC

## 2023-08-23 ENCOUNTER — Ambulatory Visit: Payer: Medicare HMO | Attending: Internal Medicine | Admitting: Internal Medicine

## 2023-08-23 ENCOUNTER — Encounter: Payer: Self-pay | Admitting: Internal Medicine

## 2023-08-23 VITALS — BP 134/70 | HR 61 | Ht 69.0 in | Wt 173.4 lb

## 2023-08-23 DIAGNOSIS — I10 Essential (primary) hypertension: Secondary | ICD-10-CM | POA: Diagnosis not present

## 2023-08-23 DIAGNOSIS — R0789 Other chest pain: Secondary | ICD-10-CM | POA: Insufficient documentation

## 2023-08-23 DIAGNOSIS — I421 Obstructive hypertrophic cardiomyopathy: Secondary | ICD-10-CM

## 2023-08-23 DIAGNOSIS — Z01810 Encounter for preprocedural cardiovascular examination: Secondary | ICD-10-CM

## 2023-08-23 DIAGNOSIS — R079 Chest pain, unspecified: Secondary | ICD-10-CM | POA: Insufficient documentation

## 2023-08-23 NOTE — Addendum Note (Signed)
 Addended by: Carmelina Paddock on: 08/23/2023 02:32 PM   Modules accepted: Orders

## 2023-08-23 NOTE — Progress Notes (Signed)
 Cardiology Office Note  Date: 08/23/2023   ID: Jimmy Park, DOB [birth year], MRN [id number]  PCP:  Mechele Claude, MD  Cardiologist:  Marjo Bicker, MD Electrophysiologist:  None   History of Present Illness: Jimmy Park is a 73 y.o. male known to have HTN, hydrocephalus was referred to cardiology for evaluation of chest pain.  Patient has ongoing chest pain for the last 2 to 3 months.  He has this chest pain with coughing mostly and sometimes with activities he is not able to explain.  He is a poor historian.  Collateral history is obtained from the wife.  Patient is not much active but he makes his bed every night and he does not have chest pain symptoms at this time.  No other symptoms of DOE, dizziness, presyncope, syncope, leg swelling and palpitations.  Past Medical History:  Diagnosis Date   Allergy    Anemia    Asthma    B12 deficiency    Cerebral ventriculomegaly    Gait abnormality    Hypertension    Increased intracranial pressure 02/12/2012   Formatting of this note might be different from the original.  Per telephone call from Dr.Dillon Dilaldigala [phone] MRI     Pneumonia of left lung due to infectious organism 08/21/2020   URI with cough and congestion 08/21/2020    Past Surgical History:  Procedure Laterality Date   LUMBAR DRAIN IMPLANTATION      Current Outpatient Medications  Medication Sig Dispense Refill   albuterol (VENTOLIN HFA) 108 (90 Base) MCG/ACT inhaler TAKE 2 PUFFS BY MOUTH EVERY 6 HOURS AS NEEDED FOR WHEEZE OR SHORTNESS OF BREATH 18 each 5   amoxicillin-clavulanate (AUGMENTIN) 875-125 MG tablet Take 1 tablet by mouth 2 (two) times daily. Take all of this medication 20 tablet 0   aspirin 81 MG tablet Take 1 tablet by mouth daily.     Blood Glucose Monitoring Suppl (ONE TOUCH ULTRA 2) w/Device KIT CHECK BLOOD SUGAR EVERY MORNING AS DIRECTED 1 kit 0   cyanocobalamin (VITAMIN B12) 1000 MCG/ML injection INJECT 1 ML SUBCUTANEOUSLY EVERY  30 DAYS 3 mL 0   DENTAGEL 1.1 % GEL dental gel SMARTSIG:Application To Teeth     fexofenadine-pseudoephedrine (ALLEGRA-D 24) 180-240 MG 24 hr tablet Take 1 tablet by mouth daily. For allergy and congestion 30 tablet 11   fluticasone (FLONASE) 50 MCG/ACT nasal spray SPRAY 2 SPRAYS INTO EACH NOSTRIL EVERY DAY 48 mL 3   fluticasone-salmeterol (ADVAIR DISKUS) 250-50 MCG/ACT AEPB TAKE 1 PUFF BY MOUTH TWICE A DAY 60 each 2   folic acid (FOLVITE) 1 MG tablet Take 1 tablet by mouth daily.     glucose blood (ONETOUCH ULTRA) test strip Check BS TID Dx R73.03 300 strip 3   ibuprofen (ADVIL,MOTRIN) 400 MG tablet Take 1 tablet by mouth 2 (two) times daily as needed.     ketoconazole (NIZORAL) 2 % shampoo Apply topically.     Lancets (ONETOUCH DELICA PLUS LANCET33G) MISC Check BS TID Dx R73.03 300 each 3   metFORMIN (GLUCOPHAGE-XR) 750 MG 24 hr tablet Take 1 tablet (750 mg total) by mouth 2 (two) times daily with a meal. 180 tablet 0   metoprolol tartrate (LOPRESSOR) 25 MG tablet Take 1 tablet (25 mg total) by mouth daily. 90 tablet 3   montelukast (SINGULAIR) 10 MG tablet TAKE 1 TABLET BY MOUTH EVERY DAY 90 tablet 1   Omega-3 Fatty Acids (FISH OIL) 1000 MG CAPS Take by mouth.     omeprazole (  PRILOSEC) 40 MG capsule Take 1 capsule (40 mg total) by mouth daily. 90 capsule 3   promethazine-dextromethorphan (PROMETHAZINE-DM) 6.25-15 MG/5ML syrup Take 5 mLs by mouth 4 (four) times daily as needed for cough. 180 mL 0   valsartan (DIOVAN) 320 MG tablet Take 1 tablet (320 mg total) by mouth daily. 90 tablet 3   No current facility-administered medications for this visit.   Allergies:  Patient has no known allergies.   Social History: The patient  reports that he quit smoking about 50 years ago. His smoking use included cigarettes. He started smoking about 56 years ago. He has a 3 pack-year smoking history. He has never used smokeless tobacco. He reports that he does not currently use alcohol. He reports that he  does not use drugs.   Family History: The patient's family history includes Asthma in his brother; Diabetes in his father and mother; Hypertension in his father and mother.   ROS:  Please see the history of present illness. Otherwise, complete review of systems is positive for none  All other systems are reviewed and negative.   Physical Exam: VS:  BP 134/70   Pulse 61   Ht 5\' 9"  (1.753 m)   Wt 173 lb 6.4 oz (78.7 kg)   SpO2 95%   BMI 25.61 kg/m , BMI Body mass index is 25.61 kg/m.  Wt Readings from Last 3 Encounters:  08/23/23 173 lb 6.4 oz (78.7 kg)  08/09/23 172 lb (78 kg)  06/16/23 172 lb (78 kg)    General: Patient appears comfortable at rest. HEENT: Conjunctiva and lids normal, oropharynx clear with moist mucosa. Neck: Supple, no elevated JVP or carotid bruits, no thyromegaly. Lungs: Clear to auscultation, nonlabored breathing at rest. Cardiac: Regular rate and rhythm, no S3 or significant systolic murmur, no pericardial rub. Abdomen: Soft, nontender, no hepatomegaly, bowel sounds present, no guarding or rebound. Extremities: No pitting edema, distal pulses 2+. Skin: Warm and dry. Musculoskeletal: No kyphosis. Neuropsychiatric: Alert and oriented x3, affect grossly appropriate.  Recent Labwork: 08/09/2023: ALT 54; AST 49; BUN 11; Creatinine, Ser 0.93; Hemoglobin 14.9; Platelets 246; Potassium 4.6; Sodium 140     Component Value Date/Time   CHOL 166 08/09/2023 1109   TRIG 133 08/09/2023 1109   TRIG 110 09/07/2014 1417   HDL 42 08/09/2023 1109   HDL 39 (L) 09/07/2014 1417   CHOLHDL 4.0 08/09/2023 1109   LDLCALC 100 (H) 08/09/2023 1109     Assessment and Plan:  Chest pain: Ongoing chest pain for the last 2 to 3 months, mostly occurs with coughing and sometimes with activities he is not able to recall/explaine.  Poor historian.  Collateral history obtained from the wife.  Chest pain likely musculoskeletal but due to cardiac risk factors, cannot exclude CAD completely.   Sedentary at baseline.  Due to cardiac risk factors of HTN and DM 2, will obtain CT cardiac.  Documentation of hypertrophic cardiomyopathy on the EMR: I did not find an echocardiogram that reported hypertrophic cardiomyopathy on the EMR.  Will obtain echocardiogram.  HTN, controlled: Continue valsartan 320 mg once daily.  Follows with PCP.  DM 2: Follows with PCP, currently on metformin       Medication Adjustments/Labs and Tests Ordered: Current medicines are reviewed at length with the patient today.  Concerns regarding medicines are outlined above.    Disposition:  Follow up  pending results  Signed Jimmy [place] Verne Spurr, MD, 08/23/2023 1:53 PM    [facility] at [facility]  [address] [place], [place], [place] [address]

## 2023-08-23 NOTE — Patient Instructions (Signed)
 Medication Instructions:  Your physician recommends that you continue on your current medications as directed. Please refer to the Current Medication list given to you today.   Labwork: BMET to be completed 1-2 weeks prior to Coronary CTA at [facility]  Testing/Procedures:   Your cardiac CT will be scheduled at one of the below locations:   [facility] [address] [place], [place] [address] [phone]  If scheduled at [facility], please arrive at the [facility] and Children's Entrance (Entrance C2) of [facility] 30 minutes prior to test start time. You can use the FREE valet parking offered at entrance C (encouraged to control the heart rate for the test)  Proceed to the [facility] Radiology Department (first floor) to check-in and test prep.  All radiology patients and guests should use entrance C2 at [facility], accessed from [facility], even though the hospital's physical address listed is [address]    There is spacious parking and easy access to the radiology department from the [facility] Heart and Vascular entrance. Please enter here and check-in with the desk attendant.   Please follow these instructions carefully (unless otherwise directed):  An IV will be required for this test and Nitroglycerin will be given.   On the Night Before the Test: Be sure to Drink plenty of water. Do not consume any caffeinated/decaffeinated beverages or chocolate 12 hours prior to your test. Do not take any antihistamines 12 hours prior to your test. If the patient has contrast allergy: No allergy  On the Day of the Test: Drink plenty of water until 1 hour prior to the test. Do not eat any food 1 hour prior to test. You may take your regular medications prior to the test.  Take metoprolol 100 mg(Lopressor) two hours prior to test.(4 tablets of the 25 mg) Patients who wear a continuous glucose monitor MUST remove the device prior  to scanning.      After the Test: Drink plenty of water. After receiving IV contrast, you may experience a mild flushed feeling. This is normal. On occasion, you may experience a mild rash up to 24 hours after the test. This is not dangerous. If this occurs, you can take Benadryl 25 mg, Zyrtec, Claritin, or Allegra and increase your fluid intake. (Patients taking Tikosyn should avoid Benadryl, and may take Zyrtec, Claritin, or Allegra) If you experience trouble breathing, this can be serious. If it is severe call [phone] IMMEDIATELY. If it is mild, please call our office.  We will call to schedule your test 2-4 weeks out understanding that some insurance companies will need an authorization prior to the service being performed.   For more information and frequently asked questions, please visit our website : [email or web address]  For non-scheduling related questions, please contact the cardiac imaging nurse navigator should you have any questions/concerns: Cardiac Imaging Nurse Navigators Direct Office Dial: [phone]   For scheduling needs, including cancellations and rescheduling, please call [place], [phone].   Follow-Up: Your physician recommends that you schedule a follow-up appointment in: Pending Results  Any Other Special Instructions Will Be Listed Below (If Applicable). Thank you for choosing [facility]!     If you need a refill on your cardiac medications before your next appointment, please call your pharmacy.

## 2023-08-30 ENCOUNTER — Other Ambulatory Visit: Payer: Self-pay | Admitting: Family Medicine

## 2023-08-30 DIAGNOSIS — U071 COVID-19: Secondary | ICD-10-CM

## 2023-08-30 DIAGNOSIS — J4520 Mild intermittent asthma, uncomplicated: Secondary | ICD-10-CM

## 2023-08-30 DIAGNOSIS — E538 Deficiency of other specified B group vitamins: Secondary | ICD-10-CM

## 2023-09-01 ENCOUNTER — Ambulatory Visit: Attending: Internal Medicine

## 2023-09-01 DIAGNOSIS — I421 Obstructive hypertrophic cardiomyopathy: Secondary | ICD-10-CM

## 2023-09-02 ENCOUNTER — Telehealth (HOSPITAL_COMMUNITY): Payer: Self-pay | Admitting: *Deleted

## 2023-09-02 LAB — ECHOCARDIOGRAM COMPLETE
AR max vel: 3.29 cm2
AV Area VTI: 2.75 cm2
AV Area mean vel: 2.87 cm2
AV Mean grad: 4 mmHg
AV Peak grad: 8.1 mmHg
Ao pk vel: 1.42 m/s
Area-P 1/2: 2.87 cm2
Calc EF: 60.4 %
MV VTI: 2.62 cm2
S' Lateral: 2.9 cm
Single Plane A2C EF: 62.9 %
Single Plane A4C EF: 57.7 %

## 2023-09-02 NOTE — Telephone Encounter (Signed)
 Reaching out to patient to offer assistance regarding upcoming cardiac imaging study; pt's wife answered phone and verbalizes understanding of appt date/time, parking situation and where to check in, pre-test NPO status, and verified current allergies; name and call back number provided for further questions should they arise  Larey Brick RN Navigator Cardiac Imaging [facility] [phone] office [phone] cell  Patient to take his daily 25mg  metoprolol tartrate. He is aware to arrive at 11:30 AM.

## 2023-09-03 ENCOUNTER — Ambulatory Visit (HOSPITAL_COMMUNITY)
Admission: RE | Admit: 2023-09-03 | Discharge: 2023-09-03 | Disposition: A | Discharge status: Home / Self Care | Source: Ambulatory Visit | Attending: Internal Medicine | Admitting: Internal Medicine

## 2023-09-03 DIAGNOSIS — I2510 Atherosclerotic heart disease of native coronary artery without angina pectoris: Secondary | ICD-10-CM | POA: Insufficient documentation

## 2023-09-03 DIAGNOSIS — R079 Chest pain, unspecified: Secondary | ICD-10-CM | POA: Insufficient documentation

## 2023-09-03 MED ORDER — IOHEXOL 350 MG/ML SOLN
95.0000 mL | Freq: Once | INTRAVENOUS | Status: AC | PRN
  Administered 2023-09-03: 95 mL via INTRAVENOUS

## 2023-09-03 MED ORDER — NITROGLYCERIN 0.4 MG SL SUBL
SUBLINGUAL_TABLET | SUBLINGUAL | Status: AC
  Filled 2023-09-03: qty 2

## 2023-09-03 MED ORDER — NITROGLYCERIN 0.4 MG SL SUBL
0.8000 mg | SUBLINGUAL_TABLET | Freq: Once | SUBLINGUAL | Status: AC
Start: 2023-09-03 — End: 2023-09-03
  Administered 2023-09-03: 0.8 mg via SUBLINGUAL

## 2023-09-15 ENCOUNTER — Ambulatory Visit: Payer: Medicare HMO | Admitting: Cardiology

## 2023-09-15 ENCOUNTER — Telehealth: Payer: Self-pay | Admitting: Internal Medicine

## 2023-09-15 NOTE — Telephone Encounter (Signed)
 Wife Tyler Aas) called to follow-up on patient's test results.

## 2023-09-17 ENCOUNTER — Other Ambulatory Visit: Payer: Self-pay | Admitting: Internal Medicine

## 2023-09-17 MED ORDER — ROSUVASTATIN CALCIUM 10 MG PO TABS: 10.0000 mg | ORAL_TABLET | Freq: Every day | ORAL | 1 refills | Status: DC

## 2023-09-20 DIAGNOSIS — J4520 Mild intermittent asthma, uncomplicated: Secondary | ICD-10-CM | POA: Diagnosis not present

## 2023-09-28 DIAGNOSIS — H401221 Low-tension glaucoma, left eye, mild stage: Secondary | ICD-10-CM | POA: Diagnosis not present

## 2023-10-20 DIAGNOSIS — J4520 Mild intermittent asthma, uncomplicated: Secondary | ICD-10-CM | POA: Diagnosis not present

## 2023-11-10 ENCOUNTER — Encounter: Payer: Self-pay | Admitting: Family Medicine

## 2023-11-10 ENCOUNTER — Ambulatory Visit: Admitting: Family Medicine

## 2023-11-10 VITALS — BP 147/77 | HR 56 | Temp 98.2°F | Ht 69.0 in | Wt 172.0 lb

## 2023-11-10 DIAGNOSIS — E782 Mixed hyperlipidemia: Secondary | ICD-10-CM | POA: Diagnosis not present

## 2023-11-10 DIAGNOSIS — Z7984 Long term (current) use of oral hypoglycemic drugs: Secondary | ICD-10-CM | POA: Diagnosis not present

## 2023-11-10 DIAGNOSIS — E1169 Type 2 diabetes mellitus with other specified complication: Secondary | ICD-10-CM

## 2023-11-10 DIAGNOSIS — E756 Lipid storage disorder, unspecified: Secondary | ICD-10-CM

## 2023-11-10 DIAGNOSIS — E1142 Type 2 diabetes mellitus with diabetic polyneuropathy: Secondary | ICD-10-CM

## 2023-11-10 LAB — CMP14+EGFR
ALT: 31 IU/L (ref 0–44)
AST: 31 IU/L (ref 0–40)
Albumin: 4.6 g/dL (ref 3.8–4.8)
Alkaline Phosphatase: 103 IU/L (ref 44–121)
BUN/Creatinine Ratio: 19 (ref 10–24)
BUN: 16 mg/dL (ref 8–27)
Bilirubin Total: 0.5 mg/dL (ref 0.0–1.2)
CO2: 22 mmol/L (ref 20–29)
Calcium: 9.4 mg/dL (ref 8.6–10.2)
Chloride: 99 mmol/L (ref 96–106)
Creatinine, Ser: 0.86 mg/dL (ref 0.76–1.27)
Globulin, Total: 2.3 g/dL (ref 1.5–4.5)
Glucose: 125 mg/dL — ABNORMAL HIGH (ref 70–99)
Potassium: 4.2 mmol/L (ref 3.5–5.2)
Sodium: 137 mmol/L (ref 134–144)
Total Protein: 6.9 g/dL (ref 6.0–8.5)
eGFR: 91 mL/min/{1.73_m2} (ref >59)

## 2023-11-10 LAB — LIPID PANEL
Chol/HDL Ratio: 2.4 ratio (ref 0.0–5.0)
Cholesterol, Total: 95 mg/dL — ABNORMAL LOW (ref 100–199)
HDL: 40 mg/dL (ref >39)
LDL Chol Calc (NIH): 36 mg/dL (ref 0–99)
Triglycerides: 101 mg/dL (ref 0–149)
VLDL Cholesterol Cal: 19 mg/dL (ref 5–40)

## 2023-11-10 LAB — BAYER DCA HB A1C WAIVED: HB A1C (BAYER DCA - WAIVED): 6.5 % — ABNORMAL HIGH (ref 4.8–5.6)

## 2023-11-10 MED ORDER — METFORMIN HCL ER 750 MG PO TB24: 750.0000 mg | ORAL_TABLET | Freq: Two times a day (BID) | ORAL | 0 refills | Status: DC

## 2023-11-10 NOTE — Progress Notes (Signed)
 Subjective:  Patient ID: Jimmy Park,  male    DOB: January 18, 1951  Age: 73 y.o.    CC: Medical Management of Chronic Issues ( Cough is back but and might need refill of cough medicine. )   HPI Jimmy Park presents for  follow-up of hypertension. Patient has no history of headache chest pain or shortness of breath or recent cough. Patient also denies symptoms of TIA such as numbness weakness lateralizing. Patient denies side effects from medication. States taking it regularly.  Patient also  in for follow-up of elevated cholesterol. Doing well without complaints on current medication. Denies side effects  including myalgia and arthralgia and nausea. Also in today for liver function testing. Currently no chest pain, shortness of breath or other cardiovascular related symptoms noted.  Follow-up of diabetes. Patient does check blood sugar at home. Readings run between 130s in the morning and 130- 160s after eating Patient denies symptoms such as excessive hunger or urinary frequency, excessive hunger, nausea No significant hypoglycemic spells noted. Medications reviewed. Pt reports taking them regularly. Pt. denies complication/adverse reaction today.   Cough onset 3 days ago. Requesting refill of cough med. Had calcium  scoring with cardiology rated at 66% and started on crestor  History Jimmy Park has a past medical history of Allergy, Anemia, Asthma, B12 deficiency, Cerebral ventriculomegaly, Gait abnormality, Hypertension, 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   He has a past surgical history that includes Lumbar drain implantation.   His family history includes Asthma in his brother; Diabetes in his father and mother; Hypertension in his father and mother.He reports that he quit smoking about 50 years ago. His smoking use included cigarettes. He started smoking about 56 years  ago. He has a 3 pack-year smoking history. He has never used smokeless tobacco. He reports that he does not currently use alcohol. He reports that he does not use drugs.  Current Outpatient Medications on File Prior to Visit  Medication Sig Dispense Refill   albuterol  (VENTOLIN  HFA) 108 (90 Base) MCG/ACT inhaler TAKE 2 PUFFS BY MOUTH EVERY 6 HOURS AS NEEDED FOR WHEEZE OR SHORTNESS OF BREATH 18 each 5   aspirin 81 MG tablet Take 1 tablet by mouth daily.     Blood Glucose Monitoring Suppl (ONE TOUCH ULTRA 2) w/Device KIT CHECK BLOOD SUGAR EVERY MORNING AS DIRECTED 1 kit 0   cyanocobalamin  (VITAMIN B12) 1000 MCG/ML injection INJECT 1 ML SUBCUTANEOUSLY EVERY 30 DAYS 3 mL 0   DENTAGEL 1.1 % GEL dental gel SMARTSIG:Application To Teeth     fexofenadine -pseudoephedrine (ALLEGRA -D 24) 180-240 MG 24 hr tablet Take 1 tablet by mouth daily. For allergy and congestion 30 tablet 11   fluticasone  (FLONASE ) 50 MCG/ACT nasal spray SPRAY 2 SPRAYS INTO EACH NOSTRIL EVERY DAY 48 mL 3   fluticasone -salmeterol (ADVAIR DISKUS) 250-50 MCG/ACT AEPB TAKE 1 PUFF BY MOUTH TWICE A DAY 60 each 2   folic acid  (FOLVITE ) 1 MG tablet Take 1 tablet by mouth daily.     glucose blood (ONETOUCH ULTRA) test strip Check BS TID Dx R73.03 300 strip 3   ketoconazole (NIZORAL) 2 % shampoo Apply topically.     Lancets (ONETOUCH DELICA PLUS LANCET33G) MISC Check BS TID Dx R73.03 300 each 3   metoprolol  tartrate (LOPRESSOR ) 25 MG tablet Take 1 tablet (25 mg total) by mouth daily. 90 tablet 3   montelukast  (SINGULAIR ) 10 MG tablet TAKE 1 TABLET BY MOUTH EVERY DAY  90 tablet 1   Omega-3 Fatty Acids (FISH OIL) 1000 MG CAPS Take by mouth.     omeprazole  (PRILOSEC) 40 MG capsule Take 1 capsule (40 mg total) by mouth daily. 90 capsule 3   promethazine -dextromethorphan (PROMETHAZINE -DM) 6.25-15 MG/5ML syrup Take 5 mLs by mouth 4 (four) times daily as needed for cough. 180 mL 0   rosuvastatin  (CRESTOR ) 10 MG tablet Take 1 tablet (10 mg total) by  mouth daily. 90 tablet 1   valsartan  (DIOVAN ) 320 MG tablet Take 1 tablet (320 mg total) by mouth daily. 90 tablet 3   No current facility-administered medications on file prior to visit.    ROS Review of Systems  Constitutional:  Negative for fever.  Respiratory:  Positive for cough. Negative for shortness of breath.   Cardiovascular:  Negative for chest pain.  Musculoskeletal:  Negative for arthralgias.  Skin:  Negative for rash.    Objective:  BP (!) 147/77   Pulse (!) 56   Temp 98.2 F (36.8 C)   Ht 5\' 9"  (1.753 m)   Wt 172 lb (78 kg)   SpO2 97%   BMI 25.40 kg/m   BP Readings from Last 3 Encounters:  11/10/23 (!) 147/77  09/03/23 117/64  08/23/23 134/70    Wt Readings from Last 3 Encounters:  11/10/23 172 lb (78 kg)  08/23/23 173 lb 6.4 oz (78.7 kg)  08/09/23 172 lb (78 kg)    Lab Results  Component Value Date   HGBA1C 5.5 08/09/2023   HGBA1C 6.3 (H) 05/10/2023   HGBA1C 6.2 (H) 02/02/2023    Physical Exam Vitals reviewed.  Constitutional:      Appearance: He is well-developed.  HENT:     Head: Normocephalic and atraumatic.     Right Ear: External ear normal.     Left Ear: External ear normal.     Mouth/Throat:     Pharynx: No oropharyngeal exudate or posterior oropharyngeal erythema.  Eyes:     Pupils: Pupils are equal, round, and reactive to light.  Cardiovascular:     Rate and Rhythm: Normal rate and regular rhythm.     Heart sounds: No murmur heard. Pulmonary:     Effort: No respiratory distress.     Breath sounds: Normal breath sounds.  Musculoskeletal:     Cervical back: Normal range of motion and neck supple.  Neurological:     Mental Status: He is alert and oriented to person, place, and time.         Assessment & Plan:  Diabetic lipidosis (HCC) -     Bayer DCA Hb A1c Waived -     CMP14+EGFR -     Lipid panel  Controlled type 2 diabetes mellitus with diabetic polyneuropathy, without long-term current use of insulin (HCC) -      Bayer DCA Hb A1c Waived -     CMP14+EGFR -     Lipid panel  Mixed hyperlipidemia -     CMP14+EGFR -     Lipid panel  Other orders -     metFORMIN  HCl ER; Take 1 tablet (750 mg total) by mouth 2 (two) times daily with a meal.  Dispense: 180 tablet; Refill: 0    Follow-up: Return in about 3 months (around 02/10/2024) for diabetes.  Roise Cleaver, M.D.

## 2023-11-11 ENCOUNTER — Ambulatory Visit: Payer: Self-pay | Admitting: Family Medicine

## 2023-11-11 NOTE — Progress Notes (Signed)
Hello Juniper,  Your lab result is normal and/or stable.Some minor variations that are not significant are commonly marked abnormal, but do not represent any medical problem for you.  Best regards, Mechele Claude, M.D.

## 2023-11-20 DIAGNOSIS — J4520 Mild intermittent asthma, uncomplicated: Secondary | ICD-10-CM | POA: Diagnosis not present

## 2023-12-02 ENCOUNTER — Other Ambulatory Visit: Payer: Self-pay | Admitting: Family Medicine

## 2023-12-02 DIAGNOSIS — E538 Deficiency of other specified B group vitamins: Secondary | ICD-10-CM

## 2023-12-13 ENCOUNTER — Other Ambulatory Visit: Payer: Self-pay | Admitting: Family Medicine

## 2023-12-20 DIAGNOSIS — L218 Other seborrheic dermatitis: Secondary | ICD-10-CM | POA: Diagnosis not present

## 2023-12-20 DIAGNOSIS — L821 Other seborrheic keratosis: Secondary | ICD-10-CM | POA: Diagnosis not present

## 2023-12-20 DIAGNOSIS — Z85828 Personal history of other malignant neoplasm of skin: Secondary | ICD-10-CM | POA: Diagnosis not present

## 2023-12-20 DIAGNOSIS — J4520 Mild intermittent asthma, uncomplicated: Secondary | ICD-10-CM | POA: Diagnosis not present

## 2023-12-21 ENCOUNTER — Encounter: Payer: Self-pay | Admitting: Internal Medicine

## 2023-12-21 ENCOUNTER — Ambulatory Visit: Attending: Internal Medicine | Admitting: Internal Medicine

## 2023-12-21 VITALS — BP 112/60 | HR 68 | Ht 68.0 in | Wt 172.6 lb

## 2023-12-21 DIAGNOSIS — R931 Abnormal findings on diagnostic imaging of heart and coronary circulation: Secondary | ICD-10-CM | POA: Insufficient documentation

## 2023-12-21 NOTE — Progress Notes (Addendum)
 Cardiology Office Note  Date: 12/21/2023   ID: Jimmy Park, DOB 02-Mar-1951, MRN [id number]  PCP:  Zollie Lowers, MD  Cardiologist:  Shandon Matson P Marayah Higdon, MD Electrophysiologist:  None   History of Present Illness: Jimmy Park is a 73 y.o. male known to have HTN, hydrocephalus is here for follow-up visit, to discuss results.  Initially referred to cardiology clinic for ongoing chest pain for 2 to 3 months, that was mostly with coughing and on some occasions, he was not able to explain when he was having chest pains.  CT cardiac was obtained that showed coronary calcium  score of 447, 66 percentile for age and sex matched control.  Total plaque volume is severe, 86% noncalcified plaque particularly in the RCA.  Currently on aspirin 81 mg once daily, rosuvastatin  10 mg nightly.  Echocardiogram showed normal LVEF, G1 DD with normal LVEDP and no valvular heart disease.  He is here for follow-up visit, accompanied by wife.  Has chest pains with coughing but he denies having any chest pains with exertion.  Not much active.  Walks with walker.  He has hydrocephalus, has gait issues.  Does not have any other symptoms of DOE, dizziness, syncope, leg swelling.  Doing great.  I reviewed CT cardiac, echocardiogram and lipid panel findings with the patient.  Past Medical History:  Diagnosis Date   Allergy    Anemia    Asthma    B12 deficiency    Cerebral ventriculomegaly    Gait abnormality    Hypertension    Increased intracranial pressure 02/12/2012   Formatting of this note might be different from the original.  Per telephone call from Dr.Dillon Dilaldigala [phone] MRI     Pneumonia of left lung due to infectious organism 08/21/2020   URI with cough and congestion 08/21/2020    Past Surgical History:  Procedure Laterality Date   LUMBAR DRAIN IMPLANTATION      Current Outpatient Medications  Medication Sig Dispense Refill   albuterol  (VENTOLIN  HFA) 108 (90 Base) MCG/ACT inhaler  TAKE 2 PUFFS BY MOUTH EVERY 6 HOURS AS NEEDED FOR WHEEZE OR SHORTNESS OF BREATH 18 each 5   aspirin 81 MG tablet Take 1 tablet by mouth daily.     Blood Glucose Monitoring Suppl (ONE TOUCH ULTRA 2) w/Device KIT CHECK BLOOD SUGAR EVERY MORNING AS DIRECTED 1 kit 0   [employer] ALLERGY RELIEF D24 180-240 MG 24 hr tablet TAKE 1 TABLET BY MOUTH DAILY. FOR ALLERGY AND CONGESTION 30 tablet 11   cyanocobalamin  (VITAMIN B12) 1000 MCG/ML injection INJECT 1 ML SUBCUTANEOUSLY EVERY 30 DAYS 3 mL 0   DENTAGEL 1.1 % GEL dental gel SMARTSIG:Application To Teeth     fluticasone  (FLONASE ) 50 MCG/ACT nasal spray SPRAY 2 SPRAYS INTO EACH NOSTRIL EVERY DAY 48 mL 3   fluticasone -salmeterol (ADVAIR DISKUS) 250-50 MCG/ACT AEPB TAKE 1 PUFF BY MOUTH TWICE A DAY 60 each 2   folic acid  (FOLVITE ) 1 MG tablet Take 1 tablet by mouth daily.     glucose blood (ONETOUCH ULTRA) test strip Check BS TID Dx R73.03 300 strip 3   ketoconazole (NIZORAL) 2 % shampoo Apply topically.     Lancets (ONETOUCH DELICA PLUS LANCET33G) MISC Check BS TID Dx R73.03 300 each 3   metFORMIN  (GLUCOPHAGE -XR) 750 MG 24 hr tablet Take 1 tablet (750 mg total) by mouth 2 (two) times daily with a meal. 180 tablet 0   metoprolol  tartrate (LOPRESSOR ) 25 MG tablet Take 1 tablet (25 mg total) by mouth daily.  90 tablet 3   montelukast  (SINGULAIR ) 10 MG tablet TAKE 1 TABLET BY MOUTH EVERY DAY 90 tablet 1   Omega-3 Fatty Acids (FISH OIL) 1000 MG CAPS Take by mouth.     omeprazole  (PRILOSEC) 40 MG capsule Take 1 capsule (40 mg total) by mouth daily. 90 capsule 3   promethazine -dextromethorphan (PROMETHAZINE -DM) 6.25-15 MG/5ML syrup Take 5 mLs by mouth 4 (four) times daily as needed for cough. 180 mL 0   rosuvastatin  (CRESTOR ) 10 MG tablet Take 1 tablet (10 mg total) by mouth daily. 90 tablet 1   valsartan  (DIOVAN ) 320 MG tablet Take 1 tablet (320 mg total) by mouth daily. 90 tablet 3   No current facility-administered medications for this visit.   Allergies:  Patient  has no known allergies.   Social History: The patient  reports that he quit smoking about 50 years ago. His smoking use included cigarettes. He started smoking about 56 years ago. He has a 3 pack-year smoking history. He has never used smokeless tobacco. He reports that he does not currently use alcohol. He reports that he does not use drugs.   Family History: The patient's family history includes Asthma in his brother; Diabetes in his father and mother; Hypertension in his father and mother.   ROS:  Please see the history of present illness. Otherwise, complete review of systems is positive for none  All other systems are reviewed and negative.   Physical Exam: VS:  There were no vitals taken for this visit., BMI There is no height or weight on file to calculate BMI.  Wt Readings from Last 3 Encounters:  11/10/23 172 lb (78 kg)  08/23/23 173 lb 6.4 oz (78.7 kg)  08/09/23 172 lb (78 kg)    General: Patient appears comfortable at rest. HEENT: Conjunctiva and lids normal, oropharynx clear with moist mucosa. Neck: Supple, no elevated JVP or carotid bruits, no thyromegaly. Lungs: Clear to auscultation, nonlabored breathing at rest. Cardiac: Regular rate and rhythm, no S3 or significant systolic murmur, no pericardial rub. Abdomen: Soft, nontender, no hepatomegaly, bowel sounds present, no guarding or rebound. Extremities: No pitting edema, distal pulses 2+. Skin: Warm and dry. Musculoskeletal: No kyphosis. Neuropsychiatric: Alert and oriented x3, affect grossly appropriate.  Recent Labwork: 08/09/2023: Hemoglobin 14.9; Platelets 246 11/10/2023: ALT 31; AST 31; BUN 16; Creatinine, Ser 0.86; Potassium 4.2; Sodium 137     Component Value Date/Time   CHOL 95 (L) 11/10/2023 1008   TRIG 101 11/10/2023 1008   TRIG 110 09/07/2014 1417   HDL 40 11/10/2023 1008   HDL 39 (L) 09/07/2014 1417   CHOLHDL 2.4 11/10/2023 1008   LDLCALC 36 11/10/2023 1008     Assessment and Plan:  Chest pain:  Noncardiac. CT cardiac showed coronary calcium  score of 447, 66 percentile for age and sex matched control. Total plaque volume is severe, 86% noncalcified plaque particularly in the RCA. Continue aspirin 81 mg once daily, rosuvastatin  10 mg nightly.  I discussed with the patient her symptoms of CAD and MI. ER precautions for chest pain provided.  Heart healthy diet and exercise recommendations provided.  Documentation of hypertrophic cardiomyopathy on the EMR: Echocardiogram obtained this year did not show any evidence of hypertrophic cardiomyopathy.  Overall, echocardiogram unremarkable.  HLD, at goal: Total plaque volume is severe, 86% noncalcified plaque present in the RCA.  Goal LDL will be less than 55.  LDL from June 2025 was 36.  Currently at goal.  Continue rosuvastatin  10 mg nightly.  HTN, controlled:  Continue valsartan  320 mg once daily.  Follows with PCP.  DM 2: Follows with PCP, currently on metformin    Reviewed and discussed echocardiogram, lipid panel and CT cardiac findings with the patient.  Answered all the questions.  Medication Adjustments/Labs and Tests Ordered: Current medicines are reviewed at length with the patient today.  Concerns regarding medicines are outlined above.    Disposition:  Follow up as needed  Signed Linnae Rasool Arleta Maywood, MD, 12/21/2023 9:04 AM    [facility] at [facility] [address] [place], [place], [place] [address]

## 2023-12-21 NOTE — Patient Instructions (Signed)
Medication Instructions:  Continue all current medications.  Labwork: none  Testing/Procedures: none  Follow-Up: As needed.    Any Other Special Instructions Will Be Listed Below (If Applicable).  If you need a refill on your cardiac medications before your next appointment, please call your pharmacy.  

## 2023-12-29 ENCOUNTER — Encounter (HOSPITAL_COMMUNITY): Payer: Self-pay | Admitting: Emergency Medicine

## 2023-12-29 ENCOUNTER — Emergency Department (HOSPITAL_COMMUNITY)
Admission: EM | Admit: 2023-12-29 | Discharge: 2023-12-29 | Disposition: A | Discharge status: Home / Self Care | Source: Ambulatory Visit | Attending: Emergency Medicine | Admitting: Emergency Medicine

## 2023-12-29 ENCOUNTER — Emergency Department (HOSPITAL_COMMUNITY)

## 2023-12-29 ENCOUNTER — Other Ambulatory Visit: Payer: Self-pay

## 2023-12-29 DIAGNOSIS — M85842 Other specified disorders of bone density and structure, left hand: Secondary | ICD-10-CM | POA: Diagnosis not present

## 2023-12-29 DIAGNOSIS — M25522 Pain in left elbow: Secondary | ICD-10-CM | POA: Diagnosis not present

## 2023-12-29 DIAGNOSIS — Z043 Encounter for examination and observation following other accident: Secondary | ICD-10-CM | POA: Diagnosis not present

## 2023-12-29 DIAGNOSIS — S59902A Unspecified injury of left elbow, initial encounter: Secondary | ICD-10-CM | POA: Diagnosis not present

## 2023-12-29 DIAGNOSIS — W1809XA Striking against other object with subsequent fall, initial encounter: Secondary | ICD-10-CM | POA: Diagnosis not present

## 2023-12-29 DIAGNOSIS — S61213A Laceration without foreign body of left middle finger without damage to nail, initial encounter: Secondary | ICD-10-CM | POA: Insufficient documentation

## 2023-12-29 DIAGNOSIS — Z23 Encounter for immunization: Secondary | ICD-10-CM | POA: Insufficient documentation

## 2023-12-29 DIAGNOSIS — S6992XA Unspecified injury of left wrist, hand and finger(s), initial encounter: Secondary | ICD-10-CM | POA: Diagnosis present

## 2023-12-29 DIAGNOSIS — S41102A Unspecified open wound of left upper arm, initial encounter: Secondary | ICD-10-CM | POA: Diagnosis not present

## 2023-12-29 DIAGNOSIS — M778 Other enthesopathies, not elsewhere classified: Secondary | ICD-10-CM | POA: Diagnosis not present

## 2023-12-29 DIAGNOSIS — S61215A Laceration without foreign body of left ring finger without damage to nail, initial encounter: Secondary | ICD-10-CM | POA: Insufficient documentation

## 2023-12-29 DIAGNOSIS — M8580 Other specified disorders of bone density and structure, unspecified site: Secondary | ICD-10-CM | POA: Diagnosis not present

## 2023-12-29 DIAGNOSIS — Z7982 Long term (current) use of aspirin: Secondary | ICD-10-CM | POA: Insufficient documentation

## 2023-12-29 MED ORDER — CEPHALEXIN 500 MG PO CAPS
500.0000 mg | ORAL_CAPSULE | Freq: Once | ORAL | Status: AC
  Administered 2023-12-29: 500 mg via ORAL
  Filled 2023-12-29: qty 1

## 2023-12-29 MED ORDER — TETANUS-DIPHTH-ACELL PERTUSSIS 5-2.5-18.5 LF-MCG/0.5 IM SUSY
0.5000 mL | PREFILLED_SYRINGE | Freq: Once | INTRAMUSCULAR | Status: AC
  Administered 2023-12-29: 0.5 mL via INTRAMUSCULAR
  Filled 2023-12-29: qty 0.5

## 2023-12-29 MED ORDER — LIDOCAINE HCL (PF) 1 % IJ SOLN
30.0000 mL | Freq: Once | INTRAMUSCULAR | Status: AC
  Administered 2023-12-29: 30 mL
  Filled 2023-12-29: qty 30

## 2023-12-29 MED ORDER — BACITRACIN ZINC 500 UNIT/GM EX OINT: 1.0000 | TOPICAL_OINTMENT | Freq: Two times a day (BID) | CUTANEOUS | 0 refills | Status: AC

## 2023-12-29 MED ORDER — BACITRACIN ZINC 500 UNIT/GM EX OINT
TOPICAL_OINTMENT | Freq: Once | CUTANEOUS | Status: AC
  Filled 2023-12-29: qty 0.9

## 2023-12-29 MED ORDER — CEPHALEXIN 500 MG PO CAPS: 500.0000 mg | ORAL_CAPSULE | Freq: Three times a day (TID) | ORAL | 0 refills | Status: AC

## 2023-12-29 NOTE — Discharge Instructions (Signed)
 Please leave leave the dressing that was placed in the emergency department on for next 24 hours.  After that you may clean the area with soap and water, apply antibiotic ointment and a clean dressing.  You may shower after 24 hours.  Until the sutures are removed please do not submerge the laceration in water to include doing dishes, swimming or taking a bath.  Please return to the emergency department or follow-up with your primary care doctor in 10 days to have the sutures removed.  Return to the emergency department immediately for any new or worsening symptoms or if any signs of infection are noted.

## 2023-12-29 NOTE — ED Triage Notes (Signed)
 Pt lost balance and fell, hit LT hand and arm on door, lac to LT middle and ring finger.  Sent to ED by urgent care d/t area would not stop bleeding, wrapped in koban at this time with no blood observed on outer bandage.  Pt takes blood thinner. Denies hitting head.

## 2023-12-29 NOTE — ED Provider Notes (Signed)
 [place] EMERGENCY DEPARTMENT AT [facility] Provider Note   CSN: [id number] Arrival date & time: 12/29/23  1645     Patient presents with: Laceration   Chi Hargens is a 73 y.o. male.   Patient is a 73 year old male who presents emergency department the chief complaint of lacerations to the 3rd and 4th digit of the left hand.  Patient notes that earlier today he fell striking the hand on a door.  He did not strike his head or injure his neck or back during the fall.  He was evaluated in urgent care and sent to the emergency department for further evaluation.  Patient notes that he lost his balance when he fell and there was no preceding dizziness, lightheadedness or syncope.   Laceration      Prior to Admission medications   Medication Sig Start Date End Date Taking? Authorizing Provider  bacitracin  ointment Apply 1 Application topically 2 (two) times daily. 12/29/23  Yes Daralene Lonni BIRCH, PA-C  cephALEXin  (KEFLEX ) 500 MG capsule Take 1 capsule (500 mg total) by mouth 3 (three) times daily for 7 days. 12/29/23 01/05/24 Yes Daralene Lonni D, PA-C  albuterol  (VENTOLIN  HFA) 108 (90 Base) MCG/ACT inhaler TAKE 2 PUFFS BY MOUTH EVERY 6 HOURS AS NEEDED FOR WHEEZE OR SHORTNESS OF BREATH 05/13/23   Zollie Lowers, MD  aspirin 81 MG tablet Take 1 tablet by mouth daily. 12/13/02   [provider]  Blood Glucose Monitoring Suppl (ONE TOUCH ULTRA 2) w/Device KIT CHECK BLOOD SUGAR EVERY MORNING AS DIRECTED 08/22/22   Zollie Lowers, MD  [employer] ALLERGY RELIEF D24 180-240 MG 24 hr tablet TAKE 1 TABLET BY MOUTH DAILY. FOR ALLERGY AND CONGESTION 12/13/23   Zollie Lowers, MD  cyanocobalamin  (VITAMIN B12) 1000 MCG/ML injection INJECT 1 ML SUBCUTANEOUSLY EVERY 30 DAYS 12/02/23   Zollie Lowers, MD  DENTAGEL 1.1 % GEL dental gel SMARTSIG:Application To Teeth 12/11/20   [provider]  fluticasone  (FLONASE ) 50 MCG/ACT nasal spray SPRAY 2 SPRAYS INTO EACH NOSTRIL EVERY DAY 08/30/23    Zollie Lowers, MD  fluticasone -salmeterol (ADVAIR DISKUS) 250-50 MCG/ACT AEPB TAKE 1 PUFF BY MOUTH TWICE A DAY Patient taking differently: Inhale 1 puff into the lungs as needed (for SOB). TAKE 1 PUFF BY MOUTH TWICE A DAY 09/15/22   Zollie Lowers, MD  folic acid  (FOLVITE ) 1 MG tablet Take 1 tablet by mouth daily. 02/27/13   [provider]  glucose blood (ONETOUCH ULTRA) test strip Check BS TID Dx R73.03 08/30/23   Zollie Lowers, MD  ketoconazole (NIZORAL) 2 % shampoo Apply topically. 12/25/20   [provider]  Lancets (ONETOUCH DELICA PLUS [place]) MISC Check BS TID Dx R73.03 10/22/22   Zollie Lowers, MD  latanoprost (XALATAN) 0.005 % ophthalmic solution Place 1 drop into both eyes at bedtime. 11/27/23   [provider]  metFORMIN  (GLUCOPHAGE -XR) 750 MG 24 hr tablet Take 1 tablet (750 mg total) by mouth 2 (two) times daily with a meal. 11/10/23   Zollie Lowers, MD  metoprolol  tartrate (LOPRESSOR ) 25 MG tablet Take 1 tablet (25 mg total) by mouth daily. 05/10/23   Zollie Lowers, MD  montelukast  (SINGULAIR ) 10 MG tablet TAKE 1 TABLET BY MOUTH EVERY DAY 08/30/23   Zollie Lowers, MD  Omega-3 Fatty Acids (FISH OIL) 1000 MG CAPS Take by mouth.    [provider]  omeprazole  (PRILOSEC) 40 MG capsule Take 1 capsule (40 mg total) by mouth daily. 05/10/23   Zollie Lowers, MD  promethazine -dextromethorphan (PROMETHAZINE -DM) 6.25-15  MG/5ML syrup Take 5 mLs by mouth 4 (four) times daily as needed for cough. 08/09/23   Zollie Lowers, MD  rosuvastatin  (CRESTOR ) 10 MG tablet Take 1 tablet (10 mg total) by mouth daily. 09/17/23 12/21/23  Mallipeddi, Vishnu P, MD  valsartan  (DIOVAN ) 320 MG tablet Take 1 tablet (320 mg total) by mouth daily. 05/10/23   Zollie Lowers, MD    Allergies: Patient has no known allergies.    Review of Systems  Skin:        Laceration to left 3rd and 4th digit  All other systems reviewed and are negative.   Updated Vital Signs BP (!) 160/85 (BP  Location: Right Arm)   Pulse 82   Temp 98.1 F (36.7 C)   Resp (!) 22   SpO2 94%   Physical Exam Vitals and nursing note reviewed.  Constitutional:      Appearance: Normal appearance.  HENT:     Head: Normocephalic and atraumatic.  Eyes:     Extraocular Movements: Extraocular movements intact.     Conjunctiva/sclera: Conjunctivae normal.     Pupils: Pupils are equal, round, and reactive to light.  Cardiovascular:     Rate and Rhythm: Normal rate and regular rhythm.     Pulses: Normal pulses.  Pulmonary:     Effort: Pulmonary effort is normal.  Chest:     Chest wall: No tenderness.  Musculoskeletal:        General: Normal range of motion.     Cervical back: Normal range of motion and neck supple. No rigidity or tenderness.     Comments: No bony tenderness noted to bilateral upper extremities or lower extremities, bruising noted over left elbow, lacerations to the 3rd and 4th digit of the left hand described in the skin exam, full flexion and extension of the digits with each joint isolated, cap refill less than 2 seconds distally, sensation intact distally  Skin:    General: Skin is warm and dry.     Comments: 2 cm laceration noted to the third digit of the left hand, U-shaped 2 cm laceration noted to the fourth digit of the left hand, both on the dorsal aspect, no obvious foreign body noted within the wound, wound evaluated to depth with adequate lighting  Neurological:     General: No focal deficit present.     Mental Status: He is alert and oriented to person, place, and time. Mental status is at baseline.  Psychiatric:        Mood and Affect: Mood normal.        Behavior: Behavior normal.        Thought Content: Thought content normal.        Judgment: Judgment normal.     (all labs ordered are listed, but only abnormal results are displayed) Labs Reviewed - No data to display  EKG: None  Radiology: DG Elbow Complete Left Result Date: 12/29/2023 CLINICAL DATA:   Fall.  Pain. EXAM: LEFT ELBOW - COMPLETE 3+ VIEW COMPARISON:  None Available. FINDINGS: Osseous structures are osteopenic. There is no evidence of fracture, dislocation, or joint effusion. There is no evidence of arthropathy or other focal bone abnormality. Soft tissues are unremarkable. There is a large olecranon spur. IMPRESSION: Osteopenia. No acute osseous abnormalities. Large olecranon spur. Electronically Signed   By: Fonda Field M.D.   On: 12/29/2023 17:36   DG Hand Complete Left Result Date: 12/29/2023 CLINICAL DATA:  Fall EXAM: LEFT HAND - COMPLETE 3+ VIEW COMPARISON:  None  Available. FINDINGS: Osseous structures are osteopenic. There is no evidence of fracture or dislocation. There is no evidence of arthropathy or other focal bone abnormality. Soft tissues are unremarkable. IMPRESSION: Osteopenia. No acute osseous abnormalities. Electronically Signed   By: Fonda Field M.D.   On: 12/29/2023 17:35     .Laceration Repair  Date/Time: 12/29/2023 6:35 PM  Performed by: Daralene Lonni BIRCH, PA-C Authorized by: Daralene Lonni BIRCH, PA-C   Consent:    Consent obtained:  Verbal   Consent given by:  Patient   Risks discussed:  Infection, pain and need for additional repair Universal protocol:    Procedure explained and questions answered to patient or proxy's satisfaction: yes     Site/side marked: yes     Immediately prior to procedure, a time out was called: yes     Patient identity confirmed:  Verbally with patient, arm band, provided demographic data and hospital-assigned identification number Anesthesia:    Anesthesia method:  Local infiltration   Local anesthetic:  Lidocaine  1% w/o epi Laceration details:    Location:  Finger   Finger location:  L long finger   Length (cm):  2 Pre-procedure details:    Preparation:  Patient was prepped and draped in usual sterile fashion and imaging obtained to evaluate for foreign bodies Exploration:    Hemostasis achieved with:   Direct pressure   Imaging obtained: x-ray     Imaging outcome: foreign body not noted     Wound exploration: wound explored through full range of motion and entire depth of wound visualized     Wound extent: no foreign body, no signs of injury, no nerve damage, no tendon damage, no underlying fracture and no vascular damage     Contaminated: no   Treatment:    Area cleansed with:  Shur-Clens   Amount of cleaning:  Extensive Skin repair:    Repair method:  Sutures   Suture size:  4-0   Suture material:  Prolene   Suture technique:  Simple interrupted   Number of sutures:  5 Approximation:    Approximation:  Close Repair type:    Repair type:  Simple Post-procedure details:    Dressing:  Antibiotic ointment and non-adherent dressing   Procedure completion:  Tolerated well, no immediate complications .Laceration Repair  Date/Time: 12/29/2023 6:36 PM  Performed by: Daralene Lonni BIRCH, PA-C Authorized by: Daralene Lonni BIRCH, PA-C   Consent:    Consent obtained:  Verbal   Consent given by:  Patient   Risks discussed:  Infection, pain and need for additional repair   Alternatives discussed:  No treatment Universal protocol:    Procedure explained and questions answered to patient or proxy's satisfaction: yes     Imaging studies available: yes     Site/side marked: yes     Immediately prior to procedure, a time out was called: yes     Patient identity confirmed:  Verbally with patient, arm band, provided demographic data and hospital-assigned identification number Anesthesia:    Anesthesia method:  Local infiltration   Local anesthetic:  Lidocaine  1% w/o epi Laceration details:    Location:  Finger   Finger location:  L ring finger   Length (cm):  2 Pre-procedure details:    Preparation:  Patient was prepped and draped in usual sterile fashion and imaging obtained to evaluate for foreign bodies Exploration:    Hemostasis achieved with:  Direct pressure   Imaging  obtained: x-ray     Imaging outcome: foreign body  not noted     Wound exploration: wound explored through full range of motion and entire depth of wound visualized     Wound extent: no foreign body, no signs of injury, no nerve damage, no tendon damage, no underlying fracture and no vascular damage   Treatment:    Area cleansed with:  Shur-Clens   Amount of cleaning:  Extensive   Layers/structures repaired:  Deep subcutaneous Deep subcutaneous:    Suture size:  5-0   Suture material:  Vicryl   Suture technique:  Simple interrupted   Number of sutures:  2 Skin repair:    Repair method:  Sutures   Suture size:  4-0   Suture material:  Prolene   Suture technique:  Simple interrupted   Number of sutures:  4 Approximation:    Approximation:  Close Repair type:    Repair type:  Simple Post-procedure details:    Dressing:  Antibiotic ointment and non-adherent dressing   Procedure completion:  Tolerated well, no immediate complications    Medications Ordered in the ED  lidocaine  (PF) (XYLOCAINE ) 1 % injection 30 mL (30 mLs Other Given 12/29/23 1730)  Tdap (BOOSTRIX ) injection 0.5 mL (0.5 mLs Intramuscular Given 12/29/23 1730)  bacitracin  ointment ( Topical Given 12/29/23 1830)  cephALEXin  (KEFLEX ) capsule 500 mg (500 mg Oral Given 12/29/23 1830)                                    Medical Decision Making Patient is doing well at this time and is stable for discharge home.  Discussed with patient that the need for continued good wound care on an outpatient basis.  Lacerations to the 3rd and 4th digits of the left hand were repaired at the bedside.  No foreign bodies were noted within the wound and wound was evaluated to depth with adequate lighting.  Full flexion extension was noted with each joint isolated.  There was no indication for underlying tendon injury or ligament injury.  Sensation was intact distally before and after procedure.  Hemostasis was obtained after the procedure there  was no continued bleeding.  Laceration to the fourth digit did require 2 subcutaneous sutures.  Will place patient on a short course of antibiotics as well.  Discussed with patient and wife that sutures do need to be removed in 10 days.  Signs of infection were discussed to look for on an outpatient basis.  Strict return precautions were provided for any new or worsening symptoms.  Patient and wife voiced understanding and had no additional questions.  Imaging: X-ray of the left hand and left elbow Independently interpreted by myself demonstrated no acute osseous injury or lesions Agree with the radiologist interpretation  Amount and/or Complexity of Data Reviewed Radiology: ordered.  Risk OTC drugs. Prescription drug management.        Final diagnoses:  Laceration of left middle finger without foreign body without damage to nail, initial encounter  Laceration of left ring finger without foreign body without damage to nail, initial encounter    ED Discharge Orders          Ordered    cephALEXin  (KEFLEX ) 500 MG capsule  3 times daily        12/29/23 1832    bacitracin  ointment  2 times daily        12/29/23 1832  Daralene Lonni BIRCH, PA-C 12/29/23 1839    Freddi Hamilton, MD 12/29/23 2245

## 2024-01-07 ENCOUNTER — Ambulatory Visit (INDEPENDENT_AMBULATORY_CARE_PROVIDER_SITE_OTHER): Admitting: Family Medicine

## 2024-01-07 ENCOUNTER — Encounter: Payer: Self-pay | Admitting: Family Medicine

## 2024-01-07 VITALS — BP 136/76 | HR 61 | Temp 97.8°F | Ht 68.0 in | Wt 172.6 lb

## 2024-01-07 DIAGNOSIS — S61213D Laceration without foreign body of left middle finger without damage to nail, subsequent encounter: Secondary | ICD-10-CM

## 2024-01-07 DIAGNOSIS — S61215D Laceration without foreign body of left ring finger without damage to nail, subsequent encounter: Secondary | ICD-10-CM

## 2024-01-07 NOTE — Progress Notes (Signed)
   Acute Office Visit  Subjective:     Patient ID: Jimmy Park, male    DOB: 1950-12-31, 73 y.o.   MRN: [id number]  Chief Complaint  Patient presents with   Suture / Staple Removal    Suture / Staple Removal   Patient is in today for suture removal.   Per ER HPI:  Patient is a 73 year old male who presents emergency department the chief complaint of lacerations to the 3rd and 4th digit of the left hand. Patient notes that earlier today he fell striking the hand on a door. He did not strike his head or injure his neck or back during the fall. He was evaluated in urgent care and sent to the emergency department for further evaluation. Patient notes that he lost his balance when he fell and there was no preceding dizziness, lightheadedness or syncope.  5 sutures were placed in left middle finger. 4 sutures were placed in left ring finger. Discharged home with keflex  and bacitracin . Completed keflex  yesterday. Denies exudate, swelling, erythema, fever, chills.  ROS As per HPI.      Objective:    BP 136/76   Pulse 61   Temp 97.8 F (36.6 C) (Temporal)   Ht 5' 8 (1.727 m)   Wt 172 lb 9.6 oz (78.3 kg)   SpO2 95%   BMI 26.24 kg/m    Physical Exam Vitals and nursing note reviewed.  Constitutional:      General: He is not in acute distress.    Appearance: He is not ill-appearing, toxic-appearing or diaphoretic.  Musculoskeletal:     Comments: Lacerations present to left middle and ring finger with approximated edges. No signs of infection. ROM and sensation intact.   Neurological:     Mental Status: He is alert and oriented to person, place, and time. Mental status is at baseline.  Psychiatric:        Mood and Affect: Mood normal.        Behavior: Behavior normal.      Suture Removal  Date/Time: 01/07/2024 10:32 AM  Performed by: Joesph Annabella HERO, FNP Authorized by: Joesph Annabella HERO, FNP  Body area: upper extremity Location details: left ring finger Wound  Appearance: clean Post-removal: antibiotic ointment applied and dressing applied Patient tolerance: patient tolerated the procedure well with no immediate complications   Suture Removal  Date/Time: 01/07/2024 10:33 AM  Performed by: Joesph Annabella HERO, FNP Authorized by: Joesph Annabella HERO, FNP  Body area: upper extremity Location details: left long finger Wound Appearance: clean Sutures Removed: 5 Post-removal: dressing applied and antibiotic ointment applied Patient tolerance: patient tolerated the procedure well with no immediate complications      No results found for any visits on 01/07/24.      Assessment & Plan:   [name] was seen today for suture / staple removal.  Diagnoses and all orders for this visit:  Laceration of left middle finger without foreign body without damage to nail, subsequent encounter -     Suture Removal  Laceration of left ring finger without foreign body without damage to nail, subsequent encounter -     Suture Removal  Sutures removed. Dressing applied. Discussed home wound care and return precautions.   Return to office for new or worsening symptoms, or if symptoms persist.   The patient indicates understanding of these issues and agrees with the plan.  Annabella HERO Joesph, FNP

## 2024-01-09 ENCOUNTER — Other Ambulatory Visit: Payer: Self-pay | Admitting: Family Medicine

## 2024-02-10 ENCOUNTER — Ambulatory Visit: Admitting: Family Medicine

## 2024-02-12 DIAGNOSIS — S6992XA Unspecified injury of left wrist, hand and finger(s), initial encounter: Secondary | ICD-10-CM | POA: Diagnosis not present

## 2024-02-17 DIAGNOSIS — S300XXA Contusion of lower back and pelvis, initial encounter: Secondary | ICD-10-CM | POA: Diagnosis not present

## 2024-02-20 DIAGNOSIS — J4520 Mild intermittent asthma, uncomplicated: Secondary | ICD-10-CM | POA: Diagnosis not present

## 2024-02-23 DIAGNOSIS — S62656A Nondisplaced fracture of medial phalanx of right little finger, initial encounter for closed fracture: Secondary | ICD-10-CM | POA: Diagnosis not present

## 2024-02-23 DIAGNOSIS — M25551 Pain in right hip: Secondary | ICD-10-CM | POA: Diagnosis not present

## 2024-02-23 DIAGNOSIS — M545 Low back pain, unspecified: Secondary | ICD-10-CM | POA: Diagnosis not present

## 2024-03-02 ENCOUNTER — Other Ambulatory Visit: Payer: Self-pay | Admitting: Family Medicine

## 2024-03-02 DIAGNOSIS — J4520 Mild intermittent asthma, uncomplicated: Secondary | ICD-10-CM

## 2024-03-02 DIAGNOSIS — E538 Deficiency of other specified B group vitamins: Secondary | ICD-10-CM

## 2024-03-04 ENCOUNTER — Other Ambulatory Visit: Payer: Self-pay | Admitting: Family Medicine

## 2024-03-07 ENCOUNTER — Telehealth: Payer: Self-pay

## 2024-03-07 NOTE — Telephone Encounter (Signed)
 Copied from CRM [id number]. Topic: Appointments - Appointment Scheduling >> Mar 07, 2024 11:53 AM Delon DASEN wrote: Wife asking if patient can get the flu shot and pneumonia shot while he is there in the morning for his appt  [phone]

## 2024-03-07 NOTE — Telephone Encounter (Signed)
 I called and spoke with wife and made her aware that we are giving out flu shots now so patient can get one at his appt but looks like he has already had the Pneumonia shot so he doesn't need that. Wife voiced understanding.

## 2024-03-08 ENCOUNTER — Ambulatory Visit: Admitting: Family Medicine

## 2024-03-08 ENCOUNTER — Encounter: Payer: Self-pay | Admitting: Family Medicine

## 2024-03-08 VITALS — BP 157/78 | HR 62 | Temp 98.2°F | Ht 68.0 in | Wt 172.2 lb

## 2024-03-08 DIAGNOSIS — R296 Repeated falls: Secondary | ICD-10-CM | POA: Diagnosis not present

## 2024-03-08 DIAGNOSIS — I10 Essential (primary) hypertension: Secondary | ICD-10-CM

## 2024-03-08 DIAGNOSIS — Z23 Encounter for immunization: Secondary | ICD-10-CM

## 2024-03-08 DIAGNOSIS — E756 Lipid storage disorder, unspecified: Secondary | ICD-10-CM

## 2024-03-08 DIAGNOSIS — Z125 Encounter for screening for malignant neoplasm of prostate: Secondary | ICD-10-CM | POA: Diagnosis not present

## 2024-03-08 DIAGNOSIS — E1142 Type 2 diabetes mellitus with diabetic polyneuropathy: Secondary | ICD-10-CM | POA: Diagnosis not present

## 2024-03-08 DIAGNOSIS — R42 Dizziness and giddiness: Secondary | ICD-10-CM | POA: Diagnosis not present

## 2024-03-08 DIAGNOSIS — E1169 Type 2 diabetes mellitus with other specified complication: Secondary | ICD-10-CM | POA: Diagnosis not present

## 2024-03-08 DIAGNOSIS — E538 Deficiency of other specified B group vitamins: Secondary | ICD-10-CM | POA: Diagnosis not present

## 2024-03-08 DIAGNOSIS — G912 (Idiopathic) normal pressure hydrocephalus: Secondary | ICD-10-CM | POA: Diagnosis not present

## 2024-03-08 LAB — BAYER DCA HB A1C WAIVED: HB A1C (BAYER DCA - WAIVED): 6.4 % — ABNORMAL HIGH (ref 4.8–5.6)

## 2024-03-08 MED ORDER — CO Q-10 100 MG PO CAPS: 100.0000 mg | ORAL_CAPSULE | Freq: Every day | ORAL | Status: AC

## 2024-03-08 NOTE — Progress Notes (Signed)
 Subjective:  Patient ID: Jimmy Park, male    DOB: [birth year]  Age: 73 y.o. MRN: [id number]  CC: Medical Management of Chronic Issues   HPI  Discussed the use of AI scribe software for clinical note transcription with the patient, who gave verbal consent to proceed.  History of Present Illness Jimmy Park is a 73 year old male with diabetes and hypertension who presents with recurrent dizziness and falls.  He experiences recurrent dizziness described as 'swimming headed,' without vertigo, occurring at any time. He has fallen twice in the past month, resulting in a broken finger and back pain. He attributes the dizziness to the initiation of rosuvastatin , prescribed by his cardiologist. He has a history of hydrocephalus, treated with an ETV procedure, and uses a walker for mobility due to balance issues.  He experiences muscle pain and cramps, particularly in his calves, since starting rosuvastatin . He is currently taking rosuvastatin , but the exact dose and frequency are not specified.  He has a persistent cough that worsens when lying down, but no fever or sweats. He uses a nebulizer with budesonide , but there have been issues with obtaining refills from the pharmacy.  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 No shortness of breath associated with his cough.          03/08/2024    9:30 AM 01/07/2024   10:21 AM 11/10/2023   11:13 AM  Depression screen PHQ 2/9  Decreased Interest 0 0 2  Down, Depressed, Hopeless 0 1 2  PHQ - 2 Score 0 1 4  Altered sleeping 0 0 2  Tired, decreased energy 1 1 2   Change in appetite 0 0 2  Feeling bad or failure about yourself  1 1 1   Trouble concentrating 1 1 1   Moving slowly or fidgety/restless 0 0 0  Suicidal thoughts 0 0 0  PHQ-9 Score 3 4 12   Difficult doing work/chores  Somewhat difficult Somewhat difficult    History Jimmy Park has a past medical history of Allergy, Anemia,  Asthma, B12 deficiency, Cerebral ventriculomegaly, Gait abnormality, Hypertension, 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   He has a past surgical history that includes Lumbar drain implantation.   His family history includes Asthma in his brother; Diabetes in his father and mother; Hypertension in his father and mother.He reports that he quit smoking about 50 years ago. His smoking use included cigarettes. He started smoking about 56 years ago. He has a 3 pack-year smoking history. He has never used smokeless tobacco. He reports that he does not currently use alcohol. He reports that he does not use drugs.    ROS Review of Systems  Constitutional: Negative.   HENT: Negative.    Eyes:  Negative for visual disturbance.  Respiratory:  Negative for cough and shortness of breath.   Cardiovascular:  Negative for chest pain and leg swelling.  Gastrointestinal:  Negative for abdominal pain, diarrhea, nausea and vomiting.  Genitourinary:  Negative for difficulty urinating.  Musculoskeletal:  Negative for arthralgias and myalgias.  Skin:  Negative for rash.  Neurological:  Positive for dizziness. Negative for headaches.  Psychiatric/Behavioral:  Negative for sleep disturbance.     Objective:  BP (!) 157/78   Pulse 62   Temp 98.2 F (36.8 C)   Ht 5' 8 (1.727 m)   Wt 172 lb 3.2 oz (78.1 kg)  SpO2 95%   BMI 26.18 kg/m   BP Readings from Last 3 Encounters:  03/08/24 (!) 157/78  01/07/24 136/76  12/29/23 (!) 161/83    Wt Readings from Last 3 Encounters:  03/08/24 172 lb 3.2 oz (78.1 kg)  01/07/24 172 lb 9.6 oz (78.3 kg)  12/21/23 172 lb 9.6 oz (78.3 kg)     Physical Exam Vitals reviewed.  Constitutional:      Appearance: He is well-developed.  HENT:     Head: Normocephalic and atraumatic.     Right Ear: External ear normal.     Left Ear: External ear normal.      Mouth/Throat:     Pharynx: No oropharyngeal exudate or posterior oropharyngeal erythema.  Eyes:     Pupils: Pupils are equal, round, and reactive to light.  Cardiovascular:     Rate and Rhythm: Normal rate and regular rhythm.     Heart sounds: No murmur heard. Pulmonary:     Effort: No respiratory distress.     Breath sounds: Normal breath sounds.  Musculoskeletal:     Cervical back: Normal range of motion and neck supple.  Neurological:     Mental Status: He is alert and oriented to person, place, and time.    Physical Exam GENERAL: Alert, cooperative, well developed, no acute distress HEENT: Normocephalic, normal oropharynx, moist mucous membranes CHEST: Clear to auscultation bilaterally, no wheezes, rhonchi, or crackles CARDIOVASCULAR: Normal heart rate and rhythm, S1 and S2 normal without murmurs ABDOMEN: Soft, non-tender, non-distended, without organomegaly, normal bowel sounds EXTREMITIES: No cyanosis or edema NEUROLOGICAL: Cranial nerves grossly intact, moves all extremities without gross motor or sensory deficit   Assessment & Plan:  Encounter for immunization -     Flu vaccine HIGH DOSE PF(Fluzone Trivalent)  Controlled type 2 diabetes mellitus with diabetic polyneuropathy, without long-term current use of insulin (HCC) -     CMP14+EGFR -     Bayer DCA Hb A1c Waived -     Microalbumin / creatinine urine ratio  Benign essential HTN -     CBC with Differential/Platelet  Diabetic lipidosis (HCC) -     Lipid panel  Vitamin B12 deficiency -     Vitamin B12  Screening for prostate cancer -     PSA, total and free  NPH (normal pressure hydrocephalus) (HCC) -     CT HEAD W & WO CONTRAST ( ); Future  Dizzy spells -     CT HEAD W & WO CONTRAST ( ); Future  Falling -     CT HEAD W & WO CONTRAST ( ); Future  Other orders -     Co Q-10; Take 100 mg by mouth daily.   Assessment & Plan Type 2 diabetes mellitus with polyneuropathy and other  complications   Ongoing management of type 2 diabetes mellitus with complications.  Essential hypertension   Blood pressure is slightly elevated at 147 mmHg, which is acceptable for his age but could be improved. Encourage exercise and salt reduction to help lower blood pressure.  Hydrocephalus, status post ETV   Managed with ETV. Recent increase in falls and dizziness may indicate hydrocephalus progression. Previous surgical intervention limits further surgical options. Order CT of the brain to assess for changes in hydrocephalus. Consider contacting neurosurgeon for further evaluation if symptoms worsen.  Recurrent falls and dizziness   Possibly related to hydrocephalus or other causes. Recent falls resulted in injury, including a broken finger and back pain. Perform orthostatic blood pressure measurements to evaluate for orthostatic  hypotension. Encourage use of walker to prevent falls. Follow up with orthopedic specialist for back and finger injuries.  Muscle pain associated with statin use   Muscle pain in calves is associated with rosuvastatin  use. CoQ10 supplementation is recommended to alleviate symptoms. Rosuvastatin  is important for cardiovascular health, so discontinuation is not preferred unless necessary. Recommend CoQ10 100 mg daily for muscle pain relief. Reassess muscle pain in 2-4 weeks and consider discontinuing rosuvastatin  if symptoms persist.  Chronic cough   Chronic cough persists, particularly when lying down, with no associated fever or shortness of breath. Refill cough syrup prescription for symptom management.       Follow-up: No follow-ups on file.  Butler Der, M.D.

## 2024-03-08 NOTE — Telephone Encounter (Signed)
 Pt's wife called regarding this Rx, stating that he is completely out and needs a refill please advise

## 2024-03-09 LAB — CMP14+EGFR
ALT: 29 IU/L (ref 0–44)
AST: 23 IU/L (ref 0–40)
Albumin: 4.6 g/dL (ref 3.8–4.8)
Alkaline Phosphatase: 112 IU/L (ref 47–123)
BUN/Creatinine Ratio: 14 (ref 10–24)
BUN: 12 mg/dL (ref 8–27)
Bilirubin Total: 0.6 mg/dL (ref 0.0–1.2)
CO2: 25 mmol/L (ref 20–29)
Calcium: 9.4 mg/dL (ref 8.6–10.2)
Chloride: 97 mmol/L (ref 96–106)
Creatinine, Ser: 0.86 mg/dL (ref 0.76–1.27)
Globulin, Total: 2.2 g/dL (ref 1.5–4.5)
Glucose: 118 mg/dL — ABNORMAL HIGH (ref 70–99)
Potassium: 4.5 mmol/L (ref 3.5–5.2)
Sodium: 137 mmol/L (ref 134–144)
Total Protein: 6.8 g/dL (ref 6.0–8.5)
eGFR: 91 mL/min/1.73 (ref >59)

## 2024-03-09 LAB — LIPID PANEL
Chol/HDL Ratio: 2.4 ratio (ref 0.0–5.0)
Cholesterol, Total: 99 mg/dL — ABNORMAL LOW (ref 100–199)
HDL: 41 mg/dL (ref >39)
LDL Chol Calc (NIH): 36 mg/dL (ref 0–99)
Triglycerides: 122 mg/dL (ref 0–149)
VLDL Cholesterol Cal: 22 mg/dL (ref 5–40)

## 2024-03-09 LAB — CBC WITH DIFFERENTIAL/PLATELET
Basophils Absolute: 0.1 x10E3/uL (ref 0.0–0.2)
Basos: 1 %
EOS (ABSOLUTE): 0.3 x10E3/uL (ref 0.0–0.4)
Eos: 4 %
Hematocrit: 43.7 % (ref 37.5–51.0)
Hemoglobin: 14.4 g/dL (ref 13.0–17.7)
Immature Grans (Abs): 0 x10E3/uL (ref 0.0–0.1)
Immature Granulocytes: 0 %
Lymphocytes Absolute: 1.8 x10E3/uL (ref 0.7–3.1)
Lymphs: 25 %
MCH: 31.7 pg (ref 26.6–33.0)
MCHC: 33 g/dL (ref 31.5–35.7)
MCV: 96 fL (ref 79–97)
Monocytes Absolute: 0.5 x10E3/uL (ref 0.1–0.9)
Monocytes: 7 %
Neutrophils Absolute: 4.5 x10E3/uL (ref 1.4–7.0)
Neutrophils: 63 %
Platelets: 201 x10E3/uL (ref 150–450)
RBC: 4.54 x10E6/uL (ref 4.14–5.80)
RDW: 12 % (ref 11.6–15.4)
WBC: 7.1 x10E3/uL (ref 3.4–10.8)

## 2024-03-09 LAB — MICROALBUMIN / CREATININE URINE RATIO
Creatinine, Urine: 106.4 mg/dL
Microalb/Creat Ratio: 6 mg/g{creat} (ref 0–29)
Microalbumin, Urine: 6.5 ug/mL

## 2024-03-09 LAB — VITAMIN B12: Vitamin B-12: 604 pg/mL (ref 232–1245)

## 2024-03-09 LAB — PSA, TOTAL AND FREE
PSA, Free Pct: 35 %
PSA, Free: 0.07 ng/mL
Prostate Specific Ag, Serum: 0.2 ng/mL (ref 0.0–4.0)

## 2024-03-09 NOTE — Telephone Encounter (Signed)
 Called wife back about RF on Budesonide  0.5 MG/2ML TAKE 2 MLS (0.5 MG TOTAL) BY NEBULIZATION 2 (TWO) TIMES DAILY AS NEEDED.  I had denied this the other day for DC on 09/15/22. Pt has had a big box full for awhile and only uses it prn for seasonal allergies and is now out. Please advise on refill since it is no longer on his med list.

## 2024-03-12 ENCOUNTER — Ambulatory Visit: Payer: Self-pay | Admitting: Family Medicine

## 2024-03-12 NOTE — Progress Notes (Signed)
Hello Juniper,  Your lab result is normal and/or stable.Some minor variations that are not significant are commonly marked abnormal, but do not represent any medical problem for you.  Best regards, Mechele Claude, M.D.

## 2024-03-13 ENCOUNTER — Other Ambulatory Visit: Payer: Self-pay | Admitting: Family Medicine

## 2024-03-13 ENCOUNTER — Telehealth: Payer: Self-pay | Admitting: Internal Medicine

## 2024-03-13 DIAGNOSIS — J189 Pneumonia, unspecified organism: Secondary | ICD-10-CM

## 2024-03-13 DIAGNOSIS — I10 Essential (primary) hypertension: Secondary | ICD-10-CM

## 2024-03-13 MED ORDER — ROSUVASTATIN CALCIUM 10 MG PO TABS: 10.0000 mg | ORAL_TABLET | Freq: Every day | ORAL | 2 refills | Status: AC

## 2024-03-13 MED ORDER — PROMETHAZINE-DM 6.25-15 MG/5ML PO SYRP: 5.0000 mL | ORAL_SOLUTION | Freq: Every evening | ORAL | 2 refills | Status: AC | PRN

## 2024-03-13 MED ORDER — BUDESONIDE 0.5 MG/2ML IN SUSP
0.5000 mg | Freq: Two times a day (BID) | RESPIRATORY_TRACT | 1 refills | Status: AC | PRN
Start: 2024-03-13 — End: ?

## 2024-03-13 NOTE — Telephone Encounter (Signed)
*  STAT* If patient is at the pharmacy, call can be transferred to refill team.   1. Which medications need to be refilled? (please list name of each medication and dose if known)   rosuvastatin  (CRESTOR ) 10 MG tablet    2. Which pharmacy/location (including street and city if local pharmacy) is medication to be sent to? [id number] - [place], [place] - [address] Phone: [phone]  Fax: [phone]      3. Do they need a 30 day or 90 day supply? 90

## 2024-03-13 NOTE — Telephone Encounter (Signed)
 RX sent in

## 2024-03-17 DIAGNOSIS — M79645 Pain in left finger(s): Secondary | ICD-10-CM | POA: Diagnosis not present

## 2024-03-17 DIAGNOSIS — M545 Low back pain, unspecified: Secondary | ICD-10-CM | POA: Diagnosis not present

## 2024-03-17 DIAGNOSIS — Z8781 Personal history of (healed) traumatic fracture: Secondary | ICD-10-CM | POA: Diagnosis not present

## 2024-03-21 DIAGNOSIS — J4520 Mild intermittent asthma, uncomplicated: Secondary | ICD-10-CM | POA: Diagnosis not present

## 2024-03-28 DIAGNOSIS — H401222 Low-tension glaucoma, left eye, moderate stage: Secondary | ICD-10-CM | POA: Diagnosis not present

## 2024-03-31 ENCOUNTER — Encounter: Payer: Self-pay | Admitting: *Deleted

## 2024-04-04 ENCOUNTER — Ambulatory Visit (HOSPITAL_COMMUNITY)

## 2024-04-21 DIAGNOSIS — J4520 Mild intermittent asthma, uncomplicated: Secondary | ICD-10-CM | POA: Diagnosis not present

## 2024-05-07 ENCOUNTER — Other Ambulatory Visit: Payer: Self-pay | Admitting: Family Medicine

## 2024-05-19 DIAGNOSIS — H524 Presbyopia: Secondary | ICD-10-CM | POA: Diagnosis not present

## 2024-05-19 DIAGNOSIS — Z0100 Encounter for examination of eyes and vision without abnormal findings: Secondary | ICD-10-CM | POA: Diagnosis not present

## 2024-05-30 ENCOUNTER — Ambulatory Visit: Payer: Self-pay | Admitting: Family Medicine

## 2024-05-31 ENCOUNTER — Other Ambulatory Visit: Payer: Self-pay | Admitting: Family Medicine

## 2024-05-31 DIAGNOSIS — E538 Deficiency of other specified B group vitamins: Secondary | ICD-10-CM

## 2024-06-02 NOTE — Progress Notes (Signed)
 Jimmy Park                                          MRN: [id number]   06/02/2024   The [facility] Quality Team Specialist reviewed this patient medical record for the purposes of chart review for care gap closure. The following were reviewed: chart review for care gap closure-controlling blood pressure.    [facility] Quality Team

## 2024-06-12 ENCOUNTER — Ambulatory Visit: Admitting: Family Medicine

## 2024-06-16 ENCOUNTER — Ambulatory Visit: Payer: Medicare HMO

## 2024-06-28 NOTE — Progress Notes (Signed)
 Jimmy Park                                          MRN: [id number]   06/28/2024   The [facility] Quality Team Specialist reviewed this patient medical record for the purposes of chart review for care gap closure. The following were reviewed: chart review for care gap closure-colorectal cancer screening.    [facility] Quality Team

## 2024-07-11 ENCOUNTER — Ambulatory Visit: Admitting: Family Medicine

## 2024-07-27 ENCOUNTER — Ambulatory Visit: Admitting: Family Medicine
